# Patient Record
Sex: Male | Born: 1975 | Race: Black or African American | Hispanic: No | Smoking: Former smoker
Health system: Southern US, Community
[De-identification: ages and names within clinical notes are randomized; demographics above are authoritative.]

## PROBLEM LIST (undated history)

## (undated) DIAGNOSIS — E1169 Type 2 diabetes mellitus with other specified complication: Secondary | ICD-10-CM

## (undated) DIAGNOSIS — E669 Obesity, unspecified: Secondary | ICD-10-CM

## (undated) DIAGNOSIS — I2602 Saddle embolus of pulmonary artery with acute cor pulmonale: Secondary | ICD-10-CM

## (undated) DIAGNOSIS — I82431 Acute embolism and thrombosis of right popliteal vein: Secondary | ICD-10-CM

## (undated) DIAGNOSIS — R030 Elevated blood-pressure reading, without diagnosis of hypertension: Secondary | ICD-10-CM

## (undated) DIAGNOSIS — T7840XA Allergy, unspecified, initial encounter: Secondary | ICD-10-CM

## (undated) DIAGNOSIS — G4730 Sleep apnea, unspecified: Secondary | ICD-10-CM

## (undated) HISTORY — DX: Sleep apnea, unspecified: G47.30

## (undated) HISTORY — DX: Allergy, unspecified, initial encounter: T78.40XA

---

## 2014-03-16 ENCOUNTER — Inpatient Hospital Stay (HOSPITAL_BASED_OUTPATIENT_CLINIC_OR_DEPARTMENT_OTHER)
Admission: EM | Admit: 2014-03-16 | Discharge: 2014-03-21 | DRG: 175 | Disposition: A | Discharge status: Home / Self Care | Payer: Managed Care, Other (non HMO) | Attending: Internal Medicine | Admitting: Internal Medicine

## 2014-03-16 ENCOUNTER — Encounter (HOSPITAL_BASED_OUTPATIENT_CLINIC_OR_DEPARTMENT_OTHER): Payer: Self-pay | Admitting: *Deleted

## 2014-03-16 ENCOUNTER — Emergency Department (HOSPITAL_BASED_OUTPATIENT_CLINIC_OR_DEPARTMENT_OTHER): Payer: Managed Care, Other (non HMO)

## 2014-03-16 DIAGNOSIS — R0602 Shortness of breath: Secondary | ICD-10-CM

## 2014-03-16 DIAGNOSIS — Z6841 Body Mass Index (BMI) 40.0 and over, adult: Secondary | ICD-10-CM | POA: Diagnosis not present

## 2014-03-16 DIAGNOSIS — Q211 Atrial septal defect: Secondary | ICD-10-CM | POA: Diagnosis not present

## 2014-03-16 DIAGNOSIS — I2699 Other pulmonary embolism without acute cor pulmonale: Secondary | ICD-10-CM | POA: Diagnosis present

## 2014-03-16 DIAGNOSIS — J9601 Acute respiratory failure with hypoxia: Secondary | ICD-10-CM | POA: Diagnosis present

## 2014-03-16 DIAGNOSIS — E1165 Type 2 diabetes mellitus with hyperglycemia: Secondary | ICD-10-CM | POA: Diagnosis present

## 2014-03-16 DIAGNOSIS — R030 Elevated blood-pressure reading, without diagnosis of hypertension: Secondary | ICD-10-CM

## 2014-03-16 DIAGNOSIS — I10 Essential (primary) hypertension: Secondary | ICD-10-CM | POA: Diagnosis present

## 2014-03-16 DIAGNOSIS — J9600 Acute respiratory failure, unspecified whether with hypoxia or hypercapnia: Secondary | ICD-10-CM | POA: Diagnosis present

## 2014-03-16 DIAGNOSIS — K219 Gastro-esophageal reflux disease without esophagitis: Secondary | ICD-10-CM | POA: Diagnosis present

## 2014-03-16 DIAGNOSIS — I82431 Acute embolism and thrombosis of right popliteal vein: Secondary | ICD-10-CM | POA: Diagnosis present

## 2014-03-16 DIAGNOSIS — I2609 Other pulmonary embolism with acute cor pulmonale: Secondary | ICD-10-CM | POA: Diagnosis present

## 2014-03-16 DIAGNOSIS — E669 Obesity, unspecified: Secondary | ICD-10-CM

## 2014-03-16 DIAGNOSIS — F17210 Nicotine dependence, cigarettes, uncomplicated: Secondary | ICD-10-CM | POA: Diagnosis present

## 2014-03-16 DIAGNOSIS — I2602 Saddle embolus of pulmonary artery with acute cor pulmonale: Secondary | ICD-10-CM | POA: Diagnosis not present

## 2014-03-16 DIAGNOSIS — E1169 Type 2 diabetes mellitus with other specified complication: Secondary | ICD-10-CM

## 2014-03-16 DIAGNOSIS — R05 Cough: Secondary | ICD-10-CM

## 2014-03-16 DIAGNOSIS — R059 Cough, unspecified: Secondary | ICD-10-CM | POA: Diagnosis present

## 2014-03-16 DIAGNOSIS — Q2112 Patent foramen ovale: Secondary | ICD-10-CM

## 2014-03-16 HISTORY — DX: Obesity, unspecified: E66.9

## 2014-03-16 HISTORY — DX: Saddle embolus of pulmonary artery with acute cor pulmonale: I26.02

## 2014-03-16 HISTORY — DX: Acute embolism and thrombosis of right popliteal vein: I82.431

## 2014-03-16 HISTORY — DX: Elevated blood-pressure reading, without diagnosis of hypertension: R03.0

## 2014-03-16 HISTORY — DX: Type 2 diabetes mellitus with other specified complication: E11.69

## 2014-03-16 LAB — CBC WITH DIFFERENTIAL/PLATELET
Basophils Absolute: 0 10*3/uL (ref 0.0–0.1)
Basophils Relative: 0 % (ref 0–1)
EOS PCT: 1 % (ref 0–5)
Eosinophils Absolute: 0.1 10*3/uL (ref 0.0–0.7)
HEMATOCRIT: 43.1 % (ref 39.0–52.0)
HEMOGLOBIN: 15.3 g/dL (ref 13.0–17.0)
LYMPHS ABS: 2 10*3/uL (ref 0.7–4.0)
Lymphocytes Relative: 24 % (ref 12–46)
MCH: 26.7 pg (ref 26.0–34.0)
MCHC: 35.5 g/dL (ref 30.0–36.0)
MCV: 75.3 fL — AB (ref 78.0–100.0)
MONO ABS: 1 10*3/uL (ref 0.1–1.0)
Monocytes Relative: 11 % (ref 3–12)
NEUTROS ABS: 5.2 10*3/uL (ref 1.7–7.7)
NEUTROS PCT: 64 % (ref 43–77)
PLATELETS: 243 10*3/uL (ref 150–400)
RBC: 5.72 MIL/uL (ref 4.22–5.81)
RDW: 13.9 % (ref 11.5–15.5)
WBC: 8.3 10*3/uL (ref 4.0–10.5)

## 2014-03-16 LAB — I-STAT ARTERIAL BLOOD GAS, ED
Acid-base deficit: 4 mmol/L — ABNORMAL HIGH (ref 0.0–2.0)
Bicarbonate: 20 mEq/L (ref 20.0–24.0)
O2 Saturation: 86 %
PCO2 ART: 31.8 mmHg — AB (ref 35.0–45.0)
TCO2: 21 mmol/L (ref 0–100)
pH, Arterial: 7.407 (ref 7.350–7.450)
pO2, Arterial: 50 mmHg — ABNORMAL LOW (ref 80.0–100.0)

## 2014-03-16 LAB — COMPREHENSIVE METABOLIC PANEL
ALBUMIN: 4.2 g/dL (ref 3.5–5.2)
ALT: 28 U/L (ref 0–53)
AST: 27 U/L (ref 0–37)
Alkaline Phosphatase: 101 U/L (ref 39–117)
Anion gap: 9 (ref 5–15)
BUN: 10 mg/dL (ref 6–23)
CALCIUM: 9.1 mg/dL (ref 8.4–10.5)
CHLORIDE: 99 mmol/L (ref 96–112)
CO2: 24 mmol/L (ref 19–32)
Creatinine, Ser: 0.95 mg/dL (ref 0.50–1.35)
GFR calc Af Amer: >90 mL/min (ref >90)
Glucose, Bld: 344 mg/dL — ABNORMAL HIGH (ref 70–99)
Potassium: 3.6 mmol/L (ref 3.5–5.1)
Sodium: 132 mmol/L — ABNORMAL LOW (ref 135–145)
TOTAL PROTEIN: 7.9 g/dL (ref 6.0–8.3)
Total Bilirubin: 0.5 mg/dL (ref 0.3–1.2)

## 2014-03-16 LAB — BRAIN NATRIURETIC PEPTIDE: B Natriuretic Peptide: 31.3 pg/mL (ref 0.0–100.0)

## 2014-03-16 LAB — TROPONIN I: TROPONIN I: 0.03 ng/mL (ref <0.031)

## 2014-03-16 LAB — GLUCOSE, CAPILLARY: GLUCOSE-CAPILLARY: 347 mg/dL — AB (ref 70–99)

## 2014-03-16 MED ORDER — HEPARIN (PORCINE) IN NACL 100-0.45 UNIT/ML-% IJ SOLN: 18.0000 [IU]/kg/h | Freq: Once | INTRAMUSCULAR | Status: DC

## 2014-03-16 MED ORDER — HEPARIN SODIUM (PORCINE) 5000 UNIT/ML IJ SOLN
60.0000 [IU]/kg | Freq: Once | INTRAMUSCULAR | Status: DC
  Filled 2014-03-16: qty 2

## 2014-03-16 MED ORDER — HEPARIN BOLUS VIA INFUSION
4000.0000 [IU] | Freq: Once | INTRAVENOUS | Status: AC
  Administered 2014-03-16: 4000 [IU] via INTRAVENOUS
  Administered 2014-03-16: 22:00:00 via INTRAVENOUS

## 2014-03-16 MED ORDER — IOHEXOL 350 MG/ML SOLN
100.0000 mL | Freq: Once | INTRAVENOUS | Status: AC | PRN
  Administered 2014-03-16: 100 mL via INTRAVENOUS

## 2014-03-16 MED ORDER — HEPARIN (PORCINE) IN NACL 100-0.45 UNIT/ML-% IJ SOLN
2100.0000 [IU]/h | INTRAMUSCULAR | Status: DC
  Administered 2014-03-16: 1850 [IU]/h via INTRAVENOUS
  Administered 2014-03-17: 2100 [IU]/h via INTRAVENOUS
  Filled 2014-03-16 (×4): qty 250

## 2014-03-16 MED ORDER — HEPARIN SODIUM (PORCINE) 5000 UNIT/ML IJ SOLN: 80.0000 [IU]/kg | Freq: Once | INTRAMUSCULAR | Status: DC

## 2014-03-16 MED ORDER — INSULIN ASPART 100 UNIT/ML ~~LOC~~ SOLN
0.0000 [IU] | Freq: Three times a day (TID) | SUBCUTANEOUS | Status: DC
  Administered 2014-03-17: 11 [IU] via SUBCUTANEOUS
  Administered 2014-03-17: 7 [IU] via SUBCUTANEOUS
  Administered 2014-03-17: 4 [IU] via SUBCUTANEOUS

## 2014-03-16 MED ORDER — HEPARIN (PORCINE) IN NACL 100-0.45 UNIT/ML-% IJ SOLN
INTRAMUSCULAR | Status: AC
  Filled 2014-03-16: qty 250

## 2014-03-16 MED ORDER — PANTOPRAZOLE SODIUM 40 MG IV SOLR
40.0000 mg | INTRAVENOUS | Status: DC
  Administered 2014-03-17 – 2014-03-19 (×3): 40 mg via INTRAVENOUS
  Filled 2014-03-16 (×4): qty 40

## 2014-03-16 MED ORDER — HEPARIN SODIUM (PORCINE) 5000 UNIT/ML IJ SOLN
INTRAMUSCULAR | Status: AC
  Filled 2014-03-16: qty 1

## 2014-03-16 NOTE — ED Notes (Signed)
Increased dyspnea with any exceration denies chest pain

## 2014-03-16 NOTE — ED Notes (Signed)
Cough. Bronchitis for the past week. Today he walked to his car and was unable to breath.

## 2014-03-16 NOTE — Progress Notes (Signed)
ANTICOAGULATION CONSULT NOTE - Initial Consult  Pharmacy Consult for Heparin  Indication: Pulmonary embolism   No Known Allergies  Patient Measurements: Height: 6' (182.9 cm) Weight: (!) 320 lb (145.151 kg) IBW/kg (Calculated) : 77.6 Heparin Dosing Weight: 111 kg   Vital Signs: Temp: 99.7 F (37.6 C) (02/01 1915) Temp Source: Oral (02/01 1915) BP: 170/89 mmHg (02/01 1915) Pulse Rate: 123 (02/01 1915)  Labs:  Recent Labs  03/16/14 1935  HGB 15.3  HCT 43.1  PLT 243  CREATININE 0.95  TROPONINI 0.03    Estimated Creatinine Clearance: 157.5 mL/min (by C-G formula based on Cr of 0.95).   Medical History: Past Medical History  Diagnosis Date  . Borderline diabetic   . Borderline hypertension     Medications:   (Not in a hospital admission)  Assessment: [age] YOM with no chronic medical conditions presented to the ED with SOB that started today. CT chest shows saddle embolus would large emboli extending into both lower lobes. Emboli also noted in both upper lobes. Possible early lingular pulmonary infarct.  CT also shows elevated RV to LV ratio suggest right heart strain and (at least) sub massive pulmonary embolus.  H/H and Plt wnl. No anticoagulants prior to admission  Goal of Therapy:  Heparin level 0.3-0.7 units/ml Monitor platelets by anticoagulation protocol: Yes   Plan:  -Give heparin 4000 units bolus followed by heparin infusion at 1850 units/hr.  -F/u 6 hr HL  -Monitor daily HL, CBC, and s/s of bleeding -F/u plan [facility]   Sohum Delillo, PharmD., BCPS Clinical Pharmacist Pager [phone]

## 2014-03-16 NOTE — Progress Notes (Signed)
Patient has been placed on 100% non re breather.  Patients SPO2 has increased to 100%,

## 2014-03-16 NOTE — ED Provider Notes (Signed)
CSN: [id number]     Arrival date & time 03/16/14  1900 History  This chart was scribed for Arby Barrette, MD by Gwenyth Ober, ED Scribe. This patient was seen in room MHT14/MHT14 and the patient's care was started at 8:38 PM.    Chief Complaint  Patient presents with  . Shortness of Breath   The history is provided by the patient. No language interpreter was used.    HPI Comments: Jordan Cline is a 39 y.o. male with no chronic medical conditions who presents to the Emergency Department complaining of constant SOB that started today. He states he was walking to his car when he felt SOB, light-headedness and near-syncope. Pt states he had bronchitis-like symptoms for the last 1-2 weeks that were gradually improving prior to today. He describes symptoms as productive cough with clear mucous, 1 episode of hemoptysis and intermittent sore throat. He also reports intermittent left-sided chest pain and back pain that is occasionally sharp and occurs with deep breaths. He has tried using his inhaler with no relief. Pt has a history of pneumonia and notes infection occurs almost annually. He quit smoking 2 weeks ago. Pt denies a family history of PE/DVT and early cardiac problems. He denies recent prolonged recent travel. He also denies fever as an associated symptom.  Past Medical History  Diagnosis Date  . Borderline diabetic   . Borderline hypertension    History reviewed. No pertinent past surgical history. History reviewed. No pertinent family history. History  Substance Use Topics  . Smoking status: Current Every Day Smoker    Types: Cigarettes  . Smokeless tobacco: Not on file  . Alcohol Use: Yes     Comment: occasionallly    Review of Systems  Constitutional: Negative for fever.  HENT: Positive for sore throat.   Respiratory: Positive for cough and shortness of breath.   Cardiovascular: Positive for chest pain.  Musculoskeletal: Positive for back pain.  Neurological: Positive for  light-headedness.   10 Systems reviewed and are negative for acute change except as noted in the HPI.    Allergies  Review of patient's allergies indicates no known allergies.  Home Medications   Prior to Admission medications   Not on File   BP 170/89 mmHg  Pulse 123  Temp(Src) 99.7 F (37.6 C) (Oral)  Resp 20  Ht 6' (1.829 m)  Wt 320 lb (145.151 kg)  BMI 43.39 kg/m2  SpO2 88% Physical Exam  Constitutional: He is oriented to person, place, and time. He appears well-developed and well-nourished.  The patient is alert and nontoxic. He is tachypneic with clear mental status, speaking in full short sentences.  HENT:  Head: Normocephalic and atraumatic.  Eyes: EOM are normal. Pupils are equal, round, and reactive to light.  Neck: Neck supple.  Cardiovascular: Normal rate, regular rhythm, normal heart sounds and intact distal pulses.   Pulmonary/Chest: Breath sounds normal. He is in respiratory distress (patient has tachypnea but is maintaining his airway appropriately without any evidence of fatigue.). He has no wheezes. He has no rales. He exhibits no tenderness.  Abdominal: Soft. Bowel sounds are normal. He exhibits no distension. There is no tenderness.  Musculoskeletal: Normal range of motion. He exhibits no edema or tenderness.  Neurological: He is alert and oriented to person, place, and time. He has normal strength. Coordination normal. GCS eye subscore is 4. GCS verbal subscore is 5. GCS motor subscore is 6.  Skin: Skin is warm, dry and intact.  Psychiatric: He has  a normal mood and affect.    ED Course  Procedures (including critical care time) DIAGNOSTIC STUDIES: Oxygen Saturation is 88% on RA, low by my interpretation.    COORDINATION OF CARE: 8:50 PM Discussed treatment plan with pt at bedside and pt agreed to plan.  Labs Review Labs Reviewed  CBC WITH DIFFERENTIAL/PLATELET - Abnormal; Notable for the following:    MCV 75.3 (*)    All other components within  normal limits  COMPREHENSIVE METABOLIC PANEL - Abnormal; Notable for the following:    Sodium 132 (*)    Glucose, Bld 344 (*)    All other components within normal limits  I-STAT ARTERIAL BLOOD GAS, ED - Abnormal; Notable for the following:    pCO2 arterial 31.8 (*)    pO2, Arterial 50.0 (*)    Acid-base deficit 4.0 (*)    All other components within normal limits  TROPONIN I  BRAIN NATRIURETIC PEPTIDE    Imaging Review Ct Angio Chest Pe W/cm &/or Wo Cm  03/16/2014   CLINICAL DATA:  Shortness of breath, difficulty breathing.  EXAM: CT ANGIOGRAPHY CHEST WITH CONTRAST  TECHNIQUE: Multidetector CT imaging of the chest was performed using the standard protocol during bolus administration of intravenous contrast. Multiplanar CT image reconstructions and MIPs were obtained to evaluate the vascular anatomy.  CONTRAST:  OMNIPAQUE IOHEXOL 350 MG/ML SOLN  COMPARISON:  None.  FINDINGS: There is a saddle embolus noted with extension of the emboli predominantly into the lower lobes bilaterally, but also noted in both upper lobes. The right ventricle to left ventricle ratio is elevated at 1.59 suggesting right ventricular strain. Heart is normal size. Aorta is normal caliber. No mediastinal, hilar, or axillary adenopathy. Chest wall soft tissues are unremarkable.  Airspace opacification noted in the lingula. There appears to be embolus within the subtending pulmonary artery suggesting this could represent pulmonary infarct. Trace left pleural effusion noted.  Imaging into the upper abdomen shows no acute findings. There appears to be diffuse fatty infiltration of the liver.  Review of the MIP images confirms the above findings.  IMPRESSION: Saddle embolus would large emboli extending into both lower lobes. Emboli also noted in both upper lobes. Possible early lingular pulmonary infarct.  Elevated RV to LV ratio suggest right heart strain and (at least) sub massive pulmonary embolus.  Critical Value/emergent  results were called by telephone at the time of interpretation on 03/16/2014 at 9:53 pm to Dr. Arby Barrette , who verbally acknowledged these results.   Electronically Signed   By: Charlett Nose M.D.   On: 03/16/2014 21:55   Dg Chest Port 1 View  03/16/2014   CLINICAL DATA:  Acute onset of shortness of breath and difficulty breathing. Initial encounter.  EXAM: PORTABLE CHEST - 1 VIEW  COMPARISON:  None.  FINDINGS: The lungs are well-aerated. Vascular congestion is noted. Mildly increased interstitial markings could reflect mild interstitial edema. There is no evidence of pleural effusion or pneumothorax.  The cardiomediastinal silhouette is within normal limits. No acute osseous abnormalities are seen.  IMPRESSION: Vascular congestion noted. Mildly increased interstitial markings could reflect mild interstitial edema, given the patient's symptoms.   Electronically Signed   By: Roanna Raider M.D.   On: 03/16/2014 20:27     EKG Interpretation   Date/Time:  Monday March 16 2014 19:29:53 EST Ventricular Rate:  126 PR Interval:  126 QRS Duration: 88 QT Interval:  316 QTC Calculation: 457 R Axis:   76 Text Interpretation:  Sinus tachycardia Marked  ST abnormality, possible  inferior subendocardial injury Abnormal ECG AGREE. NO STEMI. Confirmed by  Donnald GarrePfeiffer, MD, Lebron ConnersMarcy [phone]) on 03/16/2014 7:32:20 PM     Consult 21:05: The patient's case was reviewed with Dr. Alvester MorinNewton of [facility]. He is accepted [facility] with step down admission. At this time CT PE study is pending.   Recheck 22:00 The patient is alert with clear mental status. Respiratory status unchanged, no sign of fatigue patient is 100% on a nonrebreather. Heart rate is unchanged at the 120s and sinus. The patient is informed of his diagnosis.  CRITICAL CARE Performed by: Arby BarrettePfeiffer, Elbony Mcclimans   Total critical care time: 60  Critical care time was exclusive of separately billable procedures and treating other patients.  Critical  care was necessary to treat or prevent imminent or life-threatening deterioration.  Critical care was time spent personally by me on the following activities: development of treatment plan with patient and/or surrogate as well as nursing, discussions with consultants, evaluation of patient's response to treatment, examination of patient, obtaining history from patient or surrogate, ordering and performing treatments and interventions, ordering and review of laboratory studies, ordering and review of radiographic studies, pulse oximetry and re-evaluation of patient's condition. 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 Today he became acutely very short of breath at 6 PM after work. CT PE study has identified a saddle embolus. Patient does not appear at this time to have risk factors leading up to this. Admission to [facility] had been arranged prior to CT results based on the patient's finding of hypoxia and tachycardia with suspicion for significant either cardiac or pulmonary etiology. At this time consult has also been placed for vascular surgery however the patient's transport is en route [facility].    Arby BarretteMarcy Kalilah Barua, MD 03/21/14 1324

## 2014-03-16 NOTE — Progress Notes (Addendum)
ANTICOAGULATION CONSULT NOTE - Initial Consult  Pharmacy Consult for Heparin  Indication: Pulmonary embolism   No Known Allergies  Patient Measurements: Height: 6' (182.9 cm) Weight: (!) 320 lb (145.151 kg) IBW/kg (Calculated) : 77.6 Heparin Dosing Weight: 111 kg   Vital Signs: Temp: 98.7 F (37.1 C) (02/01 2240) Temp Source: Oral (02/01 2240) BP: 134/99 mmHg (02/01 2240) Pulse Rate: 134 (02/01 2240)  Labs:  Recent Labs  03/16/14 1935  HGB 15.3  HCT 43.1  PLT 243  CREATININE 0.95  TROPONINI 0.03    Estimated Creatinine Clearance: 157.5 mL/min (by C-G formula based on Cr of 0.95).   Medical History: Past Medical History  Diagnosis Date  . Borderline diabetic   . Borderline hypertension     Medications:  No prescriptions prior to admission    Assessment: [age] YOM with no chronic medical conditions presented to the ED with SOB that started today. CT chest shows saddle embolus would large emboli extending into both lower lobes. Emboli also noted in both upper lobes. Possible early lingular pulmonary infarct.  CT also shows elevated RV to LV ratio suggest right heart strain and (at least) sub massive pulmonary embolus.  H/H and Plt wnl. No anticoagulants prior to admission  Goal of Therapy:  Heparin level 0.3-0.7 units/ml Monitor platelets by anticoagulation protocol: Yes   Plan:  -Give heparin 4000 units bolus followed by heparin infusion at 1850 units/hr.  -F/u 6 hr HL  -Monitor daily HL, CBC, and s/s of bleeding -F/u plan after [facility]   Benjamin Mancheril, PharmD., BCPS Clinical Pharmacist Pager [phone]   11:55 PM       Per Dr Marchelle Gearingamaswamy, due to clinical condition and concern for low dose of heparin considering age, body mass, renal dysfunction and possible need for lytics he wants a more aggressive heparin dose.     Repeat heparin 4000 unit bolus. ( 4000 given at 2200) total 2 hr bolus of 55 units/kg of active body weight.  And  increase rate to 14 units/kg actual body weight. Or 2100 units/hr  Lytic of choice for emergent condition is TNKase. (more rapid infusion time 5 min vs 2 hours) or EKOS catheter directed lysis.  Move am HL to 0600  Note: confirmed with misty, [facility] that despite documentation on MAR from 2210 and 2212  that patient received 8000 unit bolus (4000x2) he indeed received only 4000 x1 and she just made a documentation error. She is in the process of editing the [facility] to correct to the proper dose received which was only 4000 units. Thus the total bolus received over 2 hours is 8000 units or (55 units/kg ABW)   Janice CoffinEarl, Oryn Casanova Jonathan

## 2014-03-16 NOTE — ED Notes (Signed)
Pt becomes very SOB with any exceration o2 SAT FALLS TO MID 80's with any movement EDP  notified

## 2014-03-16 NOTE — Evaluation (Signed)
[facility] transfer  EDP: Pfeiffer  Reason: hypoxia, tachycardia   3 weeks cough, sob Obese Dyspneic, hypoxic, tachycardic on [facility]  BNP pending  BP stable  Na 132 Glu 344 Vascular congestion on CXR Pending CTAngio pending  Accepted to SDU

## 2014-03-16 NOTE — ED Notes (Signed)
Placed on NRB maask after returning from CT scan d/t inability to compensate for exceration of moving from CT scanner to stretcher.

## 2014-03-16 NOTE — H&P (Signed)
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 Last few weeks has been having dyspnea, cough and passed it off as bronchitis. Some worse this past week along with gerd and atypical chest pain. On 03/16/14 upon entering car after leaving office building became very dyspneic, light headed and felt like passing out. Went to [facility] - and submassive PE with RV strain on CT angio confirmed (duplex LE data not available). Initial troponin normal. Started on IV heparin and transffered to [facility]. Prior to transfer decompensated from [place] o2 to 100% face mask (? 15L via NRB). Dyspneic and anxious upon [facility] in [facility] at [facility]. PCCM admitting 03/16/2014 . Denies prololnged travel    PAST MEDICAL HISTORY :   has a past medical history of Borderline diabetic and Borderline hypertension.  has no past surgical history on file. Prior to Admission medications   Not on File   No Known Allergies  FAMILY HISTORY:  has no family status information on file.  SOCIAL HISTORY:  reports that he has been smoking Cigarettes.  He does not have any smokeless tobacco history on file. He reports that he drinks alcohol.  REVIEW OF SYSTEMS:  Chest pain, dyspnea. . Rest per HPI. Otherwise 11 point ROS negative  SUBJECTIVE:   VITAL SIGNS: Temp:  [98.7 F (37.1 C)-99.7 F (37.6 C)] 98.7 F (37.1 C) (02/01 2240) Pulse Rate:  [123-134] 134 (02/01 2240) Resp:  [20-26] 26 (02/01 2240) BP: (134-170)/(89-99) 134/99 mmHg (02/01 2240) SpO2:  [88 %-90 %] 90 % (02/01 2249) Weight:  [320 lb (145.151 kg)] 320 lb  (145.151 kg) (02/01 1915) HEMODYNAMICS:   VENTILATOR SETTINGS:   INTAKE / OUTPUT: No intake or output data in the 24 hours ending 03/16/14 2329  PHYSICAL EXAMINATION: General:  Obese male. Looks unwell Neuro:  Alert and oriented x 3. Speech normal. Moves all 4s. Not confused Psych: anxious HEENT:  Neck supple. No elevated JVP. No neck nodes Cardiovascular:  HR 137 Lungs:  CTA bilateraly. RR 27. Speaks full sentences. But gets anxious. No acc muscle use. On face mask o2 Abdomen:  Obese. Soft. No mass Musculoskeletal:  No cyanosis. No clubbing. No edema. SOME WARMTH in LEFT CALF + Skin:  Intact but dry  LABS:  CBC  Recent Labs Lab 03/16/14 1935  WBC 8.3  HGB 15.3  HCT 43.1  PLT 243   Coag's No results for input(s): APTT, INR in the last 168 hours. BMET  Recent Labs Lab 03/16/14 1935  NA 132*  K 3.6  CL 99  CO2 24  BUN 10  CREATININE 0.95  GLUCOSE 344*   Electrolytes  Recent Labs Lab 03/16/14 1935  CALCIUM 9.1   Sepsis Markers No results for input(s): LATICACIDVEN, PROCALCITON, O2SATVEN in the last 168 hours. ABG  Recent Labs Lab 03/16/14 2153  PHART 7.407  PCO2ART 31.8*  PO2ART 50.0*   Liver Enzymes  Recent Labs Lab 03/16/14 1935  AST 27  ALT 28  ALKPHOS 101  BILITOT 0.5  ALBUMIN 4.2   Cardiac Enzymes  Recent Labs Lab  03/16/14 1935  TROPONINI 0.03   Glucose No results for input(s): GLUCAP in the last 168 hours.  Imaging No results found. CT angio 2/116 - submassive central PE with RV strain  ASSESSMENT / PLAN:  PULMONARY  A:  - Submassive PE - central saddel PE with distribution to bilateral UL and LL -  RV strain on ECHO but initial troponin and bnp normal  - Severity: Class 4 of 5 PESI score  - HIGH SCORE  - Acute hypoxemic respiratory failure  Due to PE needing face mask  - Risk factor: obese, smoker, sedentary - sits > 8h/day, grandmother with PE (possibly)  P:   IV heparin therapy (currenltly might be under-dosed)  -  Lytic Rx if decompensates   - if low bp or dVT + , then systemic lytic.   - IF worsening hypxoemia and normal bp/without DVT - then IR catheter directed lytic Rx  Rule out DVT Check ECHO    Contraindications for lytic reviewed below - none exist - see below  Prior intracranial hemorrhage Known structural cerebral vascular lesion Known malignant intracranial neoplasm Ischemic stroke within three months (excluding stroke within three hours*) Suspected aortic dissection Active bleeding or bleeding diathesis (excluding menses) Significant closed-head trauma or facial trauma within three months   Relative contraindications History of chronic, severe, poorly controlled hypertension Severe uncontrolled hypertension on presentation (SBP >180 mmHg or DBP >110 mmHg) History of ischemic stroke more than three months prior Traumatic or prolonged (>10 minute) CPR or major surgery less than three weeks Recent (within two to four weeks) internal bleeding Noncompressible vascular punctures Recent invasive procedure For streptokinase/anistreplase - Prior exposure (more than five days ago) or prior allergic reaction to these agents Pregnancy Active peptic ulcer Pericarditis or pericardial fluid Current use of anticoagulant (eg, warfarin sodium) that has produced an elevated international normalized ratio (INR) >1.7 or prothrombin time (PT) >15 seconds Age >75 years Diabetic retinopathy   CARDIOVASCULAR  A: at riskf or hemodynamic collapse P:  mnitor closely  RENAL A:  Nil acute P:   Check lytes  GASTROINTESTINAL A:  Nil acute has hx of gerd  P:   Npo ppi   HEMATOLOGIC A:  At risk for bleeding with anticoagulation P:  monitor  INFECTIOUS A:  No evicende of infection P:   monitor    ENDOCRINE A:  DM    P:   ssi  NEUROLOGIC A:  intact P:   monitior  FAMILY  - Updates: PAtient and fiancee updated at bedsicde 03/16/2014  - Inter-disciplinary family meet or  Palliative Care meeting due by:03/23/14  [facility] Submaissve PE - Rx IV heparin. If worsens, lyse. But might consider preemptive lysis if not better. wIll need to explani risks to fiancee     The patient is critically ill with multiple organ systems failure and requires high complexity decision making for assessment and support, frequent evaluation and titration of therapies, application of advanced monitoring technologies and extensive interpretation of multiple databases.   Critical Care Time devoted to patient care services described in this note is  45  Minutes. This time reflects time of care of this signee Dr Kalman Shan. This critical care time does not reflect procedure time, or teaching time or supervisory time of PA/NP/Med student/Med Resident etc but could involve care discussion time    Dr. Kalman Shan, M.D., [facility]C.P Pulmonary and Critical Care Medicine Staff Physician [facility] [place] Pulmonary and Critical Care Pager: [phone], If no answer or  between  15:00h - 7:00h: call [phone]  03/16/2014 11:58 PM

## 2014-03-16 NOTE — ED Notes (Signed)
Report to RN with [facility]

## 2014-03-17 ENCOUNTER — Inpatient Hospital Stay (HOSPITAL_COMMUNITY): Payer: Managed Care, Other (non HMO)

## 2014-03-17 DIAGNOSIS — J9601 Acute respiratory failure with hypoxia: Secondary | ICD-10-CM

## 2014-03-17 DIAGNOSIS — I2602 Saddle embolus of pulmonary artery with acute cor pulmonale: Secondary | ICD-10-CM

## 2014-03-17 DIAGNOSIS — I2699 Other pulmonary embolism without acute cor pulmonale: Secondary | ICD-10-CM

## 2014-03-17 LAB — CBC
HCT: 38.6 % — ABNORMAL LOW (ref 39.0–52.0)
HCT: 35.8 % — ABNORMAL LOW (ref 39.0–52.0)
HEMATOCRIT: 38.1 % — AB (ref 39.0–52.0)
HEMATOCRIT: 39.1 % (ref 39.0–52.0)
HEMATOCRIT: 42.9 % (ref 39.0–52.0)
HEMOGLOBIN: 13.4 g/dL (ref 13.0–17.0)
Hemoglobin: 12.3 g/dL — ABNORMAL LOW (ref 13.0–17.0)
Hemoglobin: 13.3 g/dL (ref 13.0–17.0)
Hemoglobin: 13.5 g/dL (ref 13.0–17.0)
Hemoglobin: 15.3 g/dL (ref 13.0–17.0)
MCH: 26.5 pg (ref 26.0–34.0)
MCH: 26.5 pg (ref 26.0–34.0)
MCH: 26.7 pg (ref 26.0–34.0)
MCH: 26.7 pg (ref 26.0–34.0)
MCH: 27.6 pg (ref 26.0–34.0)
MCHC: 34.3 g/dL (ref 30.0–36.0)
MCHC: 34.9 g/dL (ref 30.0–36.0)
MCHC: 35 g/dL (ref 30.0–36.0)
MCHC: 34.4 g/dL (ref 30.0–36.0)
MCHC: 35.7 g/dL (ref 30.0–36.0)
MCV: 76.4 fL — AB (ref 78.0–100.0)
MCV: 77 fL — AB (ref 78.0–100.0)
MCV: 78 fL (ref 78.0–100.0)
MCV: 75.9 fL — ABNORMAL LOW (ref 78.0–100.0)
MCV: 77.4 fL — AB (ref 78.0–100.0)
PLATELETS: 180 10*3/uL (ref 150–400)
PLATELETS: 204 10*3/uL (ref 150–400)
PLATELETS: 232 10*3/uL (ref 150–400)
Platelets: 229 10*3/uL (ref 150–400)
Platelets: 168 10*3/uL (ref 150–400)
RBC: 5.01 MIL/uL (ref 4.22–5.81)
RBC: 5.02 MIL/uL (ref 4.22–5.81)
RBC: 5.05 MIL/uL (ref 4.22–5.81)
RBC: 4.65 MIL/uL (ref 4.22–5.81)
RBC: 5.54 MIL/uL (ref 4.22–5.81)
RDW: 13.9 % (ref 11.5–15.5)
RDW: 13.9 % (ref 11.5–15.5)
RDW: 14 % (ref 11.5–15.5)
RDW: 14 % (ref 11.5–15.5)
RDW: 14 % (ref 11.5–15.5)
WBC: 7.5 10*3/uL (ref 4.0–10.5)
WBC: 8.7 10*3/uL (ref 4.0–10.5)
WBC: 9.3 10*3/uL (ref 4.0–10.5)
WBC: 9.9 10*3/uL (ref 4.0–10.5)
WBC: 9.7 10*3/uL (ref 4.0–10.5)

## 2014-03-17 LAB — BASIC METABOLIC PANEL
Anion gap: 13 (ref 5–15)
Anion gap: 8 (ref 5–15)
BUN: 6 mg/dL (ref 6–23)
BUN: 8 mg/dL (ref 6–23)
CO2: 20 mmol/L (ref 19–32)
CO2: 21 mmol/L (ref 19–32)
Calcium: 9.3 mg/dL (ref 8.4–10.5)
Calcium: 7.8 mg/dL — ABNORMAL LOW (ref 8.4–10.5)
Chloride: 100 mmol/L (ref 96–112)
Chloride: 106 mmol/L (ref 96–112)
Creatinine, Ser: 1.05 mg/dL (ref 0.50–1.35)
Creatinine, Ser: 0.91 mg/dL (ref 0.50–1.35)
GFR calc Af Amer: >90 mL/min (ref >90)
GFR calc Af Amer: >90 mL/min (ref >90)
GFR calc non Af Amer: 89 mL/min — ABNORMAL LOW (ref >90)
GFR calc non Af Amer: >90 mL/min (ref >90)
Glucose, Bld: 383 mg/dL — ABNORMAL HIGH (ref 70–99)
Glucose, Bld: 308 mg/dL — ABNORMAL HIGH (ref 70–99)
POTASSIUM: 3.9 mmol/L (ref 3.5–5.1)
Potassium: 4.5 mmol/L (ref 3.5–5.1)
Sodium: 133 mmol/L — ABNORMAL LOW (ref 135–145)
Sodium: 135 mmol/L (ref 135–145)

## 2014-03-17 LAB — MRSA PCR SCREENING: MRSA BY PCR: NEGATIVE

## 2014-03-17 LAB — HEPARIN LEVEL (UNFRACTIONATED)
HEPARIN UNFRACTIONATED: 0.77 [IU]/mL — AB (ref 0.30–0.70)
Heparin Unfractionated: 0.72 IU/mL — ABNORMAL HIGH (ref 0.30–0.70)
Heparin Unfractionated: 0.27 IU/mL — ABNORMAL LOW (ref 0.30–0.70)
Heparin Unfractionated: 0.21 IU/mL — ABNORMAL LOW (ref 0.30–0.70)

## 2014-03-17 LAB — FIBRINOGEN
FIBRINOGEN: 542 mg/dL — AB (ref 204–475)
FIBRINOGEN: 471 mg/dL (ref 204–475)
Fibrinogen: 539 mg/dL — ABNORMAL HIGH (ref 204–475)

## 2014-03-17 LAB — LACTIC ACID, PLASMA
Lactic Acid, Venous: 2.4 mmol/L (ref 0.5–2.0)
Lactic Acid, Venous: 1.6 mmol/L (ref 0.5–2.0)

## 2014-03-17 LAB — PHOSPHORUS
PHOSPHORUS: 3.5 mg/dL (ref 2.3–4.6)
Phosphorus: 3.9 mg/dL (ref 2.3–4.6)

## 2014-03-17 LAB — MAGNESIUM
MAGNESIUM: 1.6 mg/dL (ref 1.5–2.5)
Magnesium: 1.5 mg/dL (ref 1.5–2.5)

## 2014-03-17 LAB — GLUCOSE, CAPILLARY
GLUCOSE-CAPILLARY: 182 mg/dL — AB (ref 70–99)
GLUCOSE-CAPILLARY: 277 mg/dL — AB (ref 70–99)
Glucose-Capillary: 264 mg/dL — ABNORMAL HIGH (ref 70–99)
Glucose-Capillary: 236 mg/dL — ABNORMAL HIGH (ref 70–99)

## 2014-03-17 LAB — TROPONIN I
Troponin I: 0.22 ng/mL — ABNORMAL HIGH (ref <0.031)
Troponin I: 0.35 ng/mL — ABNORMAL HIGH (ref <0.031)
Troponin I: 0.27 ng/mL — ABNORMAL HIGH (ref <0.031)

## 2014-03-17 LAB — PROTIME-INR
INR: 1.15 (ref 0.00–1.49)
Prothrombin Time: 14.8 seconds (ref 11.6–15.2)

## 2014-03-17 LAB — BRAIN NATRIURETIC PEPTIDE: B NATRIURETIC PEPTIDE 5: 14.8 pg/mL (ref 0.0–100.0)

## 2014-03-17 MED ORDER — SODIUM CHLORIDE 0.9 % IJ SOLN
3.0000 mL | INTRAMUSCULAR | Status: DC | PRN
  Administered 2014-03-17: 3 mL via INTRAVENOUS
  Filled 2014-03-17: qty 3

## 2014-03-17 MED ORDER — SODIUM CHLORIDE 0.9 % IV SOLN: INTRAVENOUS | Status: DC

## 2014-03-17 MED ORDER — INFLUENZA VAC SPLIT QUAD 0.5 ML IM SUSY
0.5000 mL | PREFILLED_SYRINGE | INTRAMUSCULAR | Status: DC
  Filled 2014-03-17: qty 0.5

## 2014-03-17 MED ORDER — HEPARIN (PORCINE) IN NACL 100-0.45 UNIT/ML-% IJ SOLN
2050.0000 [IU]/h | INTRAMUSCULAR | Status: DC
  Administered 2014-03-17: 2050 [IU]/h via INTRAVENOUS

## 2014-03-17 MED ORDER — MIDAZOLAM HCL 2 MG/2ML IJ SOLN
INTRAMUSCULAR | Status: AC
  Filled 2014-03-17: qty 6

## 2014-03-17 MED ORDER — SODIUM CHLORIDE 0.9 % IV SOLN: 250.0000 mL | INTRAVENOUS | Status: DC | PRN

## 2014-03-17 MED ORDER — SODIUM CHLORIDE 0.9 % IJ SOLN
3.0000 mL | Freq: Two times a day (BID) | INTRAMUSCULAR | Status: DC
  Administered 2014-03-17 – 2014-03-20 (×7): 3 mL via INTRAVENOUS

## 2014-03-17 MED ORDER — FENTANYL CITRATE 0.05 MG/ML IJ SOLN
INTRAMUSCULAR | Status: AC | PRN
  Administered 2014-03-17 (×3): 50 ug via INTRAVENOUS

## 2014-03-17 MED ORDER — HEPARIN (PORCINE) IN NACL 100-0.45 UNIT/ML-% IJ SOLN
2250.0000 [IU]/h | INTRAMUSCULAR | Status: DC
  Administered 2014-03-17: 2150 [IU]/h via INTRAVENOUS
  Administered 2014-03-18: 2250 [IU]/h via INTRAVENOUS
  Filled 2014-03-17 (×3): qty 250

## 2014-03-17 MED ORDER — LIDOCAINE HCL 1 % IJ SOLN
INTRAMUSCULAR | Status: AC
  Filled 2014-03-17: qty 20

## 2014-03-17 MED ORDER — HEPARIN BOLUS VIA INFUSION
4000.0000 [IU] | Freq: Once | INTRAVENOUS | Status: AC
  Administered 2014-03-17: 4000 [IU] via INTRAVENOUS
  Filled 2014-03-17: qty 4000

## 2014-03-17 MED ORDER — HEPARIN (PORCINE) IN NACL 100-0.45 UNIT/ML-% IJ SOLN
2000.0000 [IU]/h | INTRAMUSCULAR | Status: DC
  Administered 2014-03-17: 2000 [IU]/h via INTRAVENOUS

## 2014-03-17 MED ORDER — FENTANYL CITRATE 0.05 MG/ML IJ SOLN
INTRAMUSCULAR | Status: AC
  Filled 2014-03-17: qty 6

## 2014-03-17 MED ORDER — IOHEXOL 300 MG/ML  SOLN
100.0000 mL | Freq: Once | INTRAMUSCULAR | Status: AC | PRN
  Administered 2014-03-17: 1 mL via INTRAVENOUS

## 2014-03-17 MED ORDER — PNEUMOCOCCAL VAC POLYVALENT 25 MCG/0.5ML IJ INJ
0.5000 mL | INJECTION | INTRAMUSCULAR | Status: DC
  Filled 2014-03-17: qty 0.5

## 2014-03-17 MED ORDER — SODIUM CHLORIDE 0.9 % IV SOLN
12.0000 mg | Freq: Once | INTRAVENOUS | Status: AC
  Administered 2014-03-17: 12 mg via INTRAVENOUS
  Filled 2014-03-17: qty 12

## 2014-03-17 MED ORDER — MIDAZOLAM HCL 2 MG/2ML IJ SOLN
INTRAMUSCULAR | Status: AC | PRN
  Administered 2014-03-17 (×3): 1 mg via INTRAVENOUS

## 2014-03-17 MED ORDER — INSULIN ASPART 100 UNIT/ML ~~LOC~~ SOLN
0.0000 [IU] | Freq: Three times a day (TID) | SUBCUTANEOUS | Status: DC
  Administered 2014-03-18 (×2): 7 [IU] via SUBCUTANEOUS
  Administered 2014-03-18: 15 [IU] via SUBCUTANEOUS
  Administered 2014-03-18: 7 [IU] via SUBCUTANEOUS
  Administered 2014-03-19: 4 [IU] via SUBCUTANEOUS
  Administered 2014-03-19 (×3): 7 [IU] via SUBCUTANEOUS
  Administered 2014-03-20: 4 [IU] via SUBCUTANEOUS
  Administered 2014-03-20 (×2): 7 [IU] via SUBCUTANEOUS
  Administered 2014-03-20 – 2014-03-21 (×2): 4 [IU] via SUBCUTANEOUS

## 2014-03-17 MED ORDER — ALTEPLASE 50 MG IV SOLR
12.0000 mg | Freq: Once | INTRAVENOUS | Status: AC
  Administered 2014-03-17 (×2): 12 mg via INTRAVENOUS
  Filled 2014-03-17: qty 12

## 2014-03-17 MED ORDER — SODIUM CHLORIDE 0.9 % IV SOLN
INTRAVENOUS | Status: DC
  Administered 2014-03-17 (×2): via INTRAVENOUS

## 2014-03-17 MED FILL — Morphine Sulfate Inj 10 MG/ML: INTRAMUSCULAR | Qty: 1 | Status: AC

## 2014-03-17 NOTE — Progress Notes (Signed)
Extensive discussion with Rosaria Ferriesfiancee Karen, sisters x 2 and brother x 1   Explained submassive PE and prognosis.  Explained current Rx of IV heparin just started  Explained role of lytics - cath directed (newer with less data), and systemic and risk for bleed (small but definite risk)  Currently they are all shell shocked   - they prefer to Rx with IV heparin - but if heparin levels subtherapeutic x 1-2, lacate//trop high, still hypoxemic , Low bp - they agree to lysis - with discretion of cath v systemic upon us  - currently no contra indication for lysis     Dr. Kalman ShanMurali Nasya Vincent, M.D., [facility]C.P Pulmonary and Critical Care Medicine Staff Physician [facility] [place] Pulmonary and Critical Care Pager: [phone], If no answer or between  15:00h - 7:00h: call [phone]  03/17/2014 12:35 AM

## 2014-03-17 NOTE — Progress Notes (Signed)
ANTICOAGULATION CONSULT NOTE - FOLLOW UP    HL = 0.27 and 0.21 (goal 0.3 - 0.7 units/mL) Heparin dosing weight = 111 kg   Assessment: 38 YOM continues on IV heparin for new PE.  Alteplase now discontinued.  Heparin level sub-therapeutic although it has not been interrupted per IR MD and RN.  No bleeding reported.   Plan: - Increase heparin gtt to 2150 units/hr - Check 6 hr HL   Deone Omahoney D. Laney Potashang, PharmD, BCPS Pager:  [phone] 03/17/2014, 10:37 PM

## 2014-03-17 NOTE — Progress Notes (Signed)
Inpatient Diabetes Program Recommendations  AACE/ADA: New Consensus Statement on Inpatient Glycemic Control (2013)  Target Ranges:  Prepandial:   less than 140 mg/dL      Peak postprandial:   less than 180 mg/dL (1-2 hours)      Critically ill patients:  140 - 180 mg/dL   Results for Jordan Cline, CHRIS S (MRN [id number]) as of 03/17/2014 07:53  Ref. Range 03/16/2014 19:35 03/16/2014 23:50 03/17/2014 06:05  Glucose Latest Range: 70-99 mg/dL 409344 (H) 811383 (H) 914308 (H)    Reason for Visit: Submassive PE  Diabetes history: Borderline DM Outpatient Diabetes medications: None Current orders for Inpatient glycemic control: Novolog 0-20 units (resistant scale) TID with meals  Inpatient Diabetes Program Recommendations Insulin - IV drip/GlucoStabilizer: Patient's blood glucose has been above 300 mg/dl. Please consider starting the ICU Glycemic Control order set Phase 2 with IV insulin. HgbA1C: Patient has a history of being a "borderline diabetic," Please consider obtaining an A1c to assess glycemic control over the past 2-3 months.  Thanks,  Christena DeemShannon Gevon Markus RN, MSN, [facility] Inpatient Diabetes Coordinator Team Pager [phone]

## 2014-03-17 NOTE — Progress Notes (Signed)
Pt transferred back to his room in 38M on non-re breather and O2. Test is complete, pressures doctumented. Sheath was pulled, pressure applied to site by Rad tech, along with pressure dressing. Site appears WDL. Instructions and teaching given to the Pt and his receiving RN regarding keeping his rt leg straight for 4 hrs and holding pressure to the sight when needing to cough. Site also assessed upon arrival to the room by this RN, Theatre stage managerad tech and receiving RN. Site still appeared WDL.

## 2014-03-17 NOTE — Progress Notes (Signed)
Pt transported to the IR room for test, accompanied by 2 RN's, and 2 Rad techs. Pt is a/o, verbal and c/o reocurent chest tightness when lying flat, pt was repositioned upon arrival to the IR room to relieve chest discomfort. He is transported on an a non-re breather and O2 saturating at 95%. He did not appear to have any s/s of respiratory distress. TPA is complete at this time.

## 2014-03-17 NOTE — Progress Notes (Addendum)
Pt's arterial pressures are as follows:  1747: Left Pulmonary Artery: 47/33, mean of 39 1748: Main Pulmonary Artery: 49/28, mean of 37

## 2014-03-17 NOTE — Procedures (Signed)
Successful insertion of bilateral pulmonary artery EKOS infusion catheters for PE lysis Infusion will begin at 1mg /hr through each catheter for 12 hours, total dose 24 mg  Initial PA pressure:  54/25  ?ASD--catheter and guidewire could access pulmonary veins from rt atrial access rec ECHO  D/w Deterding

## 2014-03-17 NOTE — Progress Notes (Signed)
39yo male slightly supratherapeutic (0.72) on heparin with initial dosing for submassive PE, though did receive 8000 units boluses; will not change for now as may drift down and check with next Q6H labs.  Vernard GamblesVeronda Keslie Gritz, PharmD, BCPS 03/17/2014 7:59 AM

## 2014-03-17 NOTE — Progress Notes (Signed)
Pharmacy notified patient returned to 2M13 for heparin review and orders.

## 2014-03-17 NOTE — Progress Notes (Signed)
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 Last few weeks has been having dyspnea, cough and passed it off as bronchitis. Some worse this past week along with gerd and atypical chest pain. On 03/16/14 upon entering car after leaving office building became very dyspneic, light headed and felt like passing out. Went [facility] - and submassive PE with RV strain on CT angio confirmed (duplex LE data not available). Initial troponin normal. Started on IV heparin and transf[facility] to [facility]. Prior to transfer decompensated from [place] o2 to 100% face mask (? 15L via NRB). Dyspneic and anxious upon arrival in [facility]SICU at [facility]. PCCM admitting 03/16/2014 . Denies prololnged travel   SUBJECTIVE:  Feels less SOB Still requiring 1.00 NRB mask Currently undergoing catheter directed lysis  VITAL SIGNS: Temp:  [97.7 F (36.5 C)-99.7 F (37.6 C)] 98 F (36.7 C) (02/02 1124) Pulse Rate:  [117-148] 117 (02/02 1100) Resp:  [15-33] 23 (02/02 1100) BP: (111-200)/(47-179) 131/96 mmHg (02/02 1100) SpO2:  [84 %-98 %] 98 % (02/02 1100) FiO2 (%):  [80 %-100 %] 80 % (02/02 0600) Weight:  [145.151 kg (320 lb)] 145.151 kg (320 lb) (02/01 1915) HEMODYNAMICS:   VENTILATOR SETTINGS: Vent Mode:  [-]  FiO2 (%):  [80 %-100 %] 80 % INTAKE / OUTPUT:  Intake/Output Summary (Last 24 hours) at 03/17/14 1237 Last data filed at 03/17/14 0700  Gross per 24 hour  Intake    563 ml  Output   1750 ml  Net  -1187 ml    PHYSICAL EXAMINATION: General:  Obese male. Comfortable   Neuro:  Alert and oriented x 3. Speech normal. Moves all 4s. Not confused Psych: anxious HEENT:  Neck supple. No elevated JVP. No neck nodes Cardiovascular:  HR 137 Lungs:  CTA bilateraly. RR 27. Speaks full sentences. No acc muscle use. On face mask o2 Abdomen:  Obese. Soft. No mass Musculoskeletal:  No cyanosis. No clubbing. No edema.  Skin:  Intact but dry  LABS:  CBC  Recent Labs Lab 03/16/14 2350 03/17/14 0605 03/17/14 0854  WBC 9.7 7.5 8.7  HGB 15.3 13.4 13.3  HCT 42.9 39.1 38.1*  PLT 232 204 229   Coag's  Recent Labs Lab 03/16/14 2350  INR 1.15   BMET  Recent Labs Lab 03/16/14 1935 03/16/14 2350 03/17/14 0605  NA 132* 133* 135  K 3.6 4.5 3.9  CL 99 100 106  CO2 24 20 21   BUN 10 8 6   CREATININE 0.95 1.05 0.91  GLUCOSE 344* 383* 308*   Electrolytes  Recent Labs Lab 03/16/14 1935 03/16/14 2350 03/17/14 0605  CALCIUM 9.1 9.3 7.8*  MG  --  1.6 1.5  PHOS  --  3.9 3.5   Sepsis Markers  Recent Labs Lab 03/16/14 2350 03/17/14 0605  LATICACIDVEN 2.4* 1.6   ABG  Recent Labs Lab 03/16/14 2153  PHART 7.407  PCO2ART 31.8*  PO2ART 50.0*   Liver Enzymes  Recent Labs Lab 03/16/14 1935  AST 27  ALT 28  ALKPHOS  101  BILITOT 0.5  ALBUMIN 4.2   Cardiac Enzymes  Recent Labs Lab 03/16/14 1935 03/16/14 2350 03/17/14 0605  TROPONINI 0.03 0.22* 0.35*   Glucose  Recent Labs Lab 03/16/14 2326 03/17/14 0807  GLUCAP 347* 182*    Imaging Ct Angio Chest Pe W/cm &/or Wo Cm  03/16/2014   CLINICAL DATA:  Shortness of breath, difficulty breathing.  EXAM: CT ANGIOGRAPHY CHEST WITH CONTRAST  TECHNIQUE: Multidetector CT imaging of the chest was performed using the standard protocol during bolus administration of intravenous contrast. Multiplanar CT image reconstructions and MIPs were obtained to evaluate the vascular anatomy.  CONTRAST:  OMNIPAQUE IOHEXOL 350 MG/ML SOLN  COMPARISON:  None.  FINDINGS: There is a saddle embolus noted with  extension of the emboli predominantly into the lower lobes bilaterally, but also noted in both upper lobes. The right ventricle to left ventricle ratio is elevated at 1.59 suggesting right ventricular strain. Heart is normal size. Aorta is normal caliber. No mediastinal, hilar, or axillary adenopathy. Chest wall soft tissues are unremarkable.  Airspace opacification noted in the lingula. There appears to be embolus within the subtending pulmonary artery suggesting this could represent pulmonary infarct. Trace left pleural effusion noted.  Imaging into the upper abdomen shows no acute findings. There appears to be diffuse fatty infiltration of the liver.  Review of the MIP images confirms the above findings.  IMPRESSION: Saddle embolus would large emboli extending into both lower lobes. Emboli also noted in both upper lobes. Possible early lingular pulmonary infarct.  Elevated RV to LV ratio suggest right heart strain and (at least) sub massive pulmonary embolus.  Critical Value/emergent results were called by telephone at the time of interpretation on 03/16/2014 at 9:53 pm to Dr. Arby Barrette , who verbally acknowledged these results.   Electronically Signed   By: Charlett Nose M.D.   On: 03/16/2014 21:55   Dg Chest Port 1 View  03/16/2014   CLINICAL DATA:  Acute onset of shortness of breath and difficulty breathing. Initial encounter.  EXAM: PORTABLE CHEST - 1 VIEW  COMPARISON:  None.  FINDINGS: The lungs are well-aerated. Vascular congestion is noted. Mildly increased interstitial markings could reflect mild interstitial edema. There is no evidence of pleural effusion or pneumothorax.  The cardiomediastinal silhouette is within normal limits. No acute osseous abnormalities are seen.  IMPRESSION: Vascular congestion noted. Mildly increased interstitial markings could reflect mild interstitial edema, given the patient's symptoms.   Electronically Signed   By: Roanna Raider M.D.   On: 03/16/2014 20:27   CT angio  2/116 - submassive central PE with RV strain  ASSESSMENT / PLAN:  PULMONARY  A:  - Submassive PE - central saddle PE with distribution to bilateral UL and LL -  RV strain on ECHO but initial troponin and bnp normal  - Severity: Class 4 of 5 PESI score  - HIGH SCORE  - Acute hypoxemic respiratory failure    - Risk factor: obese, smoker, sedentary - sits > 8h/day, grandmother with PE (possibly)  P:   IV heparin therapy  Catheter directed lytics underway Follow PAP's post-lysis Follow closely in ICU TTE on 2/3  CARDIOVASCULAR A: at risk for hemodynamic collapse, no evidence thus far P:  monitor closely  RENAL A:  Nil acute P:   Check lytes  GASTROINTESTINAL A:  hx of gerd P:   Npo ppi  HEMATOLOGIC A:  R popliteal DVT At risk for bleeding with anticoagulation P:  Heparin with transition to oral  anticoag when lytics completed. Will likely need lifelong  INFECTIOUS A:  No evidence of infection P:   monitor  ENDOCRINE A:  DM P:   ssi  NEUROLOGIC A:  intact P:   monitior  FAMILY  - Updates: Patient and fiancee updated at bedside 2/2  - Inter-disciplinary family meet or Palliative Care meeting due by:03/23/14  Independent CC time 35 minutes  Levy Pupa, MD, PhD 03/17/2014, 12:47 PM [facility] [phone] or if no answer [phone]

## 2014-03-17 NOTE — Progress Notes (Signed)
Bilateral lower extremity venous duplex completed.  Right:  DVT noted in the popliteal vein.  No evidence of superficial thrombosis.  No Baker's cyst.  Left:  No evidence of DVT, superficial thrombosis, or Baker's cyst.   

## 2014-03-17 NOTE — Progress Notes (Signed)
Called back to see patient - mom [place] from [place] at bedside  S: patient still with class 4 dyspnea./ Not feeling better  O intermittent SBP 180 with MAP almost 110. On 100% Face mask.   Labs  Recent Labs Lab 03/16/14 2350  LATICACIDVEN 2.4*      Recent Labs Lab 03/16/14 1935 03/16/14 2350  TROPONINI 0.03 0.22*    Now having trop leak and lactic acodis  A Submassive PE High clot burden  P Cath directed lysis -Rx. - good expertise at our center, lower dose to reduce risk of bleed and hopefully prevent long term complications Avoid systemic lysuis due to occ bp 180 sbp and cut down risk for bleed  Benefits, risks, limitations explained. They all agree to proceed    The patient is critically ill with multiple organ systems failure and requires high complexity decision making for assessment and support, frequent evaluation and titration of therapies, application of advanced monitoring technologies and extensive interpretation of multiple databases.   Critical Care Time devoted to patient care services described in this note is  45  Minutes (2 x family meeints). This time reflects time of care of this signee Dr Kalman ShanMurali Annjeanette Sarwar. This critical care time does not reflect procedure time, or teaching time or supervisory time of PA/NP/Med student/Med Resident etc but could involve care discussion time    Dr. Kalman ShanMurali Adelynne Joerger, M.D., [facility] Pulmonary and Critical Care Medicine Staff Physician [facility] [place] [facility] Pager: [phone], If no answer or between  15:00h - 7:00h: call [phone]  03/17/2014 1:50 AM

## 2014-03-17 NOTE — Procedures (Signed)
Completion of 12 hour TPA infusion thru bilateral pulmonary artery infusion catheters.  Post treatment PA pressure is 49/28, mean 37 in main pulmonary artery.  Vascular sheaths removed.  No immediate complication.  Return to ICU and continue IV heparin.

## 2014-03-17 NOTE — Progress Notes (Signed)
CRITICAL VALUE ALERT  Critical value received:  Lactic Acid 2.4  Date of notification:  03-17-14  Time of notification:  0115  Critical value read back:Yes.    Nurse who received alert:  Ron AgeeElizabeth Rishita Petron    MD notified (1st page): M. Ramaswamy   Time of first page:  0115  MD notified (2nd page):  Time of second page:  Responding MD:  Lavinia SharpsM Ramaswamy  Time MD responded:  [phone]

## 2014-03-17 NOTE — Progress Notes (Signed)
Attempted echo. The images are undiagnostic with the ECOS machine running. Echo will come back tomorrow.

## 2014-03-17 NOTE — Progress Notes (Signed)
ANTICOAGULATION CONSULT NOTE - Follow Up Consult  Pharmacy Consult for Heparin  Indication: Pulmonary embolism   No Known Allergies  Patient Measurements: Height: 6' (182.9 cm) Weight: (!) 320 lb (145.151 kg) IBW/kg (Calculated) : 77.6 Heparin Dosing Weight: 111 kg   Vital Signs: Temp: 98 F (36.7 C) (02/02 1124) Temp Source: Oral (02/02 1124) BP: 114/72 mmHg (02/02 1200) Pulse Rate: 120 (02/02 1200)  Labs:  Recent Labs  03/16/14 1935 03/16/14 2350 03/17/14 0605 03/17/14 0854 03/17/14 1200  HGB 15.3 15.3 13.4 13.3  --   HCT 43.1 42.9 39.1 38.1*  --   PLT 243 232 204 229  --   LABPROT  --  14.8  --   --   --   INR  --  1.15  --   --   --   HEPARINUNFRC  --   --  0.72*  --  0.77*  CREATININE 0.95 1.05 0.91  --   --   TROPONINI 0.03 0.22* 0.35*  --   --     Estimated Creatinine Clearance: 164.4 mL/min (by C-G formula based on Cr of 0.91).   Medical History: Past Medical History  Diagnosis Date  . Borderline diabetic   . Borderline hypertension     Medications:  No prescriptions prior to admission    Assessment: 39yo male remains supratherapeutic (0.77) on heparin dosing for submassive PE. No s/s of bleeding noted. tPA scheduled to likely stop around 1530 today after patient examined by IR. Patient then planned to return to IR for likely sheath removal today after heparin is held for around 4 hours. No s/s of bleeding observed.   CT also shows elevated RV to LV ratio suggest right heart strain and (at least) sub massive pulmonary embolus.  H/H and Plt wnl. No anticoagulants prior to admission  Goal of Therapy:  Heparin level 0.3-0.7 units/ml Monitor platelets by anticoagulation protocol: Yes   Plan:  -Decrease heparin infusion slightly to 2000 units/hr.  -F/u resuming heparin after patient is taken to IR  -Patient will likely need lifetime anti-coag. F/u oral coag options  -Monitor for s/s of bleeding   Vinnie LevelBenjamin Fleet Higham, PharmD., BCPS Clinical  Pharmacist Pager [phone]

## 2014-03-18 DIAGNOSIS — I2699 Other pulmonary embolism without acute cor pulmonale: Secondary | ICD-10-CM

## 2014-03-18 LAB — BASIC METABOLIC PANEL
Anion gap: 9 (ref 5–15)
BUN: 6 mg/dL (ref 6–23)
CO2: 24 mmol/L (ref 19–32)
Calcium: 8.6 mg/dL (ref 8.4–10.5)
Chloride: 104 mmol/L (ref 96–112)
Creatinine, Ser: 1.06 mg/dL (ref 0.50–1.35)
GFR calc Af Amer: >90 mL/min (ref >90)
GFR calc non Af Amer: 88 mL/min — ABNORMAL LOW (ref >90)
GLUCOSE: 278 mg/dL — AB (ref 70–99)
Potassium: 4 mmol/L (ref 3.5–5.1)
SODIUM: 137 mmol/L (ref 135–145)

## 2014-03-18 LAB — GLUCOSE, CAPILLARY
GLUCOSE-CAPILLARY: 201 mg/dL — AB (ref 70–99)
GLUCOSE-CAPILLARY: 218 mg/dL — AB (ref 70–99)
Glucose-Capillary: 327 mg/dL — ABNORMAL HIGH (ref 70–99)
Glucose-Capillary: 250 mg/dL — ABNORMAL HIGH (ref 70–99)

## 2014-03-18 LAB — CBC
HCT: 37.8 % — ABNORMAL LOW (ref 39.0–52.0)
Hemoglobin: 12.8 g/dL — ABNORMAL LOW (ref 13.0–17.0)
MCH: 26.3 pg (ref 26.0–34.0)
MCHC: 33.9 g/dL (ref 30.0–36.0)
MCV: 77.8 fL — ABNORMAL LOW (ref 78.0–100.0)
Platelets: 187 10*3/uL (ref 150–400)
RBC: 4.86 MIL/uL (ref 4.22–5.81)
RDW: 14.2 % (ref 11.5–15.5)
WBC: 9.7 10*3/uL (ref 4.0–10.5)

## 2014-03-18 LAB — HEPARIN LEVEL (UNFRACTIONATED)
HEPARIN UNFRACTIONATED: 0.53 [IU]/mL (ref 0.30–0.70)
Heparin Unfractionated: 0.31 IU/mL (ref 0.30–0.70)

## 2014-03-18 MED ORDER — INFLUENZA VAC SPLIT QUAD 0.5 ML IM SUSY
0.5000 mL | PREFILLED_SYRINGE | INTRAMUSCULAR | Status: DC
  Filled 2014-03-18: qty 0.5

## 2014-03-18 MED ORDER — PNEUMOCOCCAL VAC POLYVALENT 25 MCG/0.5ML IJ INJ
0.5000 mL | INJECTION | INTRAMUSCULAR | Status: DC
  Filled 2014-03-18: qty 0.5

## 2014-03-18 NOTE — Progress Notes (Signed)
  Echocardiogram 2D Echocardiogram has been performed.  Janalyn HarderWest, Wilhelmina Hark R 03/18/2014, 11:06 AM

## 2014-03-18 NOTE — Progress Notes (Signed)
Attempted to call report to 2W. Nurse will call back.

## 2014-03-18 NOTE — Progress Notes (Signed)
ANTICOAGULATION CONSULT NOTE - Follow Up Consult  Pharmacy Consult for Heparin  Indication: pulmonary embolus, saddle   No Known Allergies  Patient Measurements: Height: 6' (182.9 cm) Weight: (!) 321 lb 3.4 oz (145.7 kg) IBW/kg (Calculated) : 77.6  Vital Signs: Temp: 98 F (36.7 C) (02/03 1246) Temp Source: Axillary (02/03 1246) BP: 132/71 mmHg (02/03 1400) Pulse Rate: 111 (02/03 1400)  Labs:  Recent Labs  03/16/14 2350 03/17/14 0605  03/17/14 1500  03/17/14 2200 03/18/14 0422 03/18/14 0426 03/18/14 1429  HGB 15.3 13.4  < > 13.5  --  12.3* 12.8*  --   --   HCT 42.9 39.1  < > 38.6*  --  35.8* 37.8*  --   --   PLT 232 204  < > 180  --  168 187  --   --   LABPROT 14.8  --   --   --   --   --   --   --   --   INR 1.15  --   --   --   --   --   --   --   --   HEPARINUNFRC  --  0.72*  < >  --   < > 0.21*  --  0.31 0.53  CREATININE 1.05 0.91  --   --   --   --  1.06  --   --   TROPONINI 0.22* 0.35*  --  0.27*  --   --   --   --   --   < > = values in this interval not displayed.  Estimated Creatinine Clearance: 141.4 mL/min (by C-G formula based on Cr of 1.06).   Assessment: 39 y/o M with sub-massive saddle PE, s/p lytic therapy.  HL is therapeutic, Hgb 12.8, other labs as above. No issues per RN.   Goal of Therapy:  Heparin level 0.3-0.7 units/ml Monitor platelets by anticoagulation protocol: Yes   Plan:  -Continue heparin at 2250 units/hr. -Daily CBC/HL -Monitor for bleeding -F/U plans for oral anti-coagulation   Link SnufferJessica Emberlynn Riggan, PharmD, BCPS Clinical Pharmacist [phone] 03/18/2014,3:08 PM

## 2014-03-18 NOTE — Progress Notes (Signed)
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 Last few weeks has been having dyspnea, cough and passed it off as bronchitis. Some worse this past week along with gerd and atypical chest pain. On 03/16/14 upon entering car after leaving office building became very dyspneic, light headed and felt like passing out. [facility] - and submassive PE with RV strain on CT angio confirmed (duplex LE data not available). Initial troponin normal. Started on IV heparin and transf[facility]. Prior to transfer decompensated from [place] o2 to 100% face mask (? 15L via NRB). Dyspneic and anxious upon arrival in [facility]SICU [facility]. PCCM admitting 03/16/2014 . Denies prololnged travel  SUBJECTIVE:  Feels less SOB, but still requiring [place] O2. Denies CP.   VITAL SIGNS: Temp:  [97.4 F (36.3 C)-100.4 F (38 C)] 99 F (37.2 C) (02/03 0413) Pulse Rate:  [106-123] 106 (02/03 0800) Resp:  [16-26] 16 (02/03 0800) BP: (90-137)/(48-96) 111/59 mmHg (02/03 0800) SpO2:  [88 %-98 %] 90 % (02/03 0800) FiO2 (%):  [80 %] 80 % (02/03 0000) Weight:  [321 lb 3.4 oz (145.7 kg)] 321 lb 3.4 oz (145.7 kg) (02/03 0500) HEMODYNAMICS:   VENTILATOR SETTINGS: Vent Mode:  [-]  FiO2 (%):  [80 %] 80 % INTAKE / OUTPUT:  Intake/Output Summary (Last 24 hours) at 03/18/14 [id number] Last data filed at 03/18/14 0800  Gross per 24 hour  Intake 3379.95 ml  Output   3070 ml  Net 309.95 ml    PHYSICAL EXAMINATION: General:  Obese male. Comfortable  Neuro:  Alert and oriented x  3. Speech normal. Moves all 4s. Not confused HEENT:  Neck supple. No elevated JVP.  Cardiovascular: Tachy wit reg rhythm  Lungs:  CTA bilateraly. Speaks full sentences. No acc muscle use. On  o2 Abdomen:  Obese. Soft. No mass Musculoskeletal:  No cyanosis. No clubbing. No edema.  Skin:  Intact but dry  LABS:  CBC  Recent Labs Lab 03/17/14 1500 03/17/14 2200 03/18/14 0422  WBC 9.3 9.9 9.7  HGB 13.5 12.3* 12.8*  HCT 38.6* 35.8* 37.8*  PLT 180 168 187   Coag's  Recent Labs Lab 03/16/14 2350  INR 1.15   BMET  Recent Labs Lab 03/16/14 2350 03/17/14 0605 03/18/14 0422  NA 133* 135 137  K 4.5 3.9 4.0  CL 100 106 104  CO2 20 21 24   BUN 8 6 6   CREATININE 1.05 0.91 1.06  GLUCOSE 383* 308* 278*   Electrolytes  Recent Labs Lab 03/16/14 2350 03/17/14 0605 03/18/14 0422  CALCIUM 9.3 7.8* 8.6  MG 1.6 1.5  --   PHOS 3.9 3.5  --    Sepsis Markers  Recent Labs Lab 03/16/14 2350 03/17/14 0605  LATICACIDVEN 2.4* 1.6   ABG  Recent Labs Lab 03/16/14 2153  PHART 7.407  PCO2ART 31.8*  PO2ART 50.0*   Liver Enzymes  Recent Labs Lab 03/16/14 1935  AST 27  ALT 28  ALKPHOS 101  BILITOT 0.5  ALBUMIN 4.2  Cardiac Enzymes  Recent Labs Lab 03/16/14 2350 03/17/14 0605 03/17/14 1500  TROPONINI 0.22* 0.35* 0.27*   Glucose  Recent Labs Lab 03/16/14 2326 03/17/14 0807 03/17/14 1235 03/17/14 1522 03/17/14 2202 03/18/14 0827  GLUCAP 347* 182* 277* 264* 236* 201*    Imaging Ir Rande Lawman F/u Eval Art/ven Final Day (ms)  03/18/2014   CLINICAL DATA:  39 year old with bilateral pulmonary emboli. Follow-up bilateral pulmonary artery 12 hr tPA infusion.  EXAM: FOLLOW-UP THROMBOLYTIC THERAPY WITH FLUOROSCOPY  Physician: Rachelle Hora. Henn, MD  FLUOROSCOPY TIME:  48 seconds, 29.2 mGy  MEDICATIONS: None  ANESTHESIA/SEDATION: Moderate sedation time: None  PROCEDURE: Patient was brought into the interventional suite. The wire was removed from the left pulmonary artery  infusion catheter. The left pulmonary artery infusion catheter was connected to a transducer. Catheter was pulled back into the main left pulmonary artery. Catheter was further pulled back into the main pulmonary artery. Pressures were obtained from the left pulmonary artery catheter. The entire catheter was removed. The right pulmonary catheter was removed. Both right venous sheath were removed with manual compression. Manual compression was applied to the puncture sites. Patient was transferred back to the intensive care unit.  FINDINGS: Left pulmonary artery pressure: 47/33 mmHg, mean 39  Main pulmonary artery:  49/28 mmHg, mean 37  Stable position of the bilateral pulmonary artery catheters. Catheters are located in the lower lobe branches.  COMPLICATIONS: None  IMPRESSION: Completion of the catheter-directed thrombolysis for bilateral pulmonary emboli.  Minimal decrease in the pulmonary artery pressures following the thrombolytic infusion.  Removal of the pulmonary artery catheters and right groin sheaths.   Electronically Signed   By: Richarda Overlie M.D.   On: 03/18/2014 08:41   CT angio 2/116 - submassive central PE with RV strain  ASSESSMENT / PLAN:  PULMONARY  A:  - Submassive PE - central saddle PE with distribution to bilateral UL and LL -  RV strain on ECHO but initial troponin and bnp normal  - Severity: Class 4 of 5 PESI score  - HIGH SCORE  - Acute hypoxemic respiratory failure    - Risk factor: obese, smoker, sedentary - sits > 8h/day, grandmother with PE (possibly)  P:   IV heparin therapy; S/p Catheter directed lytics. Consider long-term anticoag options Follow PAP's post-lysis Continue [place] O2 to maintain sats > 95% Transfer to floow  CARDIOVASCULAR A: at risk for hemodynamic collapse, no evidence thus far ? Hx of PFO or ASD P:  monitor closely TTE on 2/3 with bubble study  RENAL A:  Nil acute P:   Monitor lytes  GASTROINTESTINAL A:  hx of gerd P:    ppi  HEMATOLOGIC A:  R popliteal DVT At risk for bleeding with anticoagulation P:  Heparin with transition to oral anticoag once he makes his decision   INFECTIOUS A:  No evidence of infection P:   monitor  ENDOCRINE A:  DM P:   Check a1c ssi  NEUROLOGIC A:  intact P:   monitior  FAMILY  - Updates: Patient and fiancee updated at bedside 2/2  - Inter-disciplinary family meet or Palliative Care meeting due by:03/23/14   Jamal Collin, MD 03/18/2014, 9:50 AM PGY-2, [facility]   Attending Note:  I have examined patient, reviewed labs, studies and notes. I have discussed the case with Dr Gayla Doss, and I agree with the data and plans as amended above. He is clinically improving post TPA. Will need to decide which PO anticoagulation he will  use, transition him over. Will move to telemetry today and transfer to triad service as of 2/4.   Levy Pupa, MD, PhD 03/18/2014, 11:28 AM [facility] [phone] or if no answer [phone]

## 2014-03-18 NOTE — Progress Notes (Signed)
ANTICOAGULATION CONSULT NOTE - Follow Up Consult  Pharmacy Consult for Heparin  Indication: pulmonary embolus, saddle   No Known Allergies  Patient Measurements: Height: 6' (182.9 cm) Weight: (!) 320 lb (145.151 kg) IBW/kg (Calculated) : 77.6  Vital Signs: Temp: 99 F (37.2 C) (02/03 0413) Temp Source: Oral (02/03 0413) BP: 106/53 mmHg (02/03 0100) Pulse Rate: 115 (02/03 0100)  Labs:  Recent Labs  03/16/14 1935 03/16/14 2350 03/17/14 0605  03/17/14 1500 03/17/14 1950 03/17/14 2200 03/18/14 0422 03/18/14 0426  HGB 15.3 15.3 13.4  < > 13.5  --  12.3* 12.8*  --   HCT 43.1 42.9 39.1  < > 38.6*  --  35.8* 37.8*  --   PLT 243 232 204  < > 180  --  168 187  --   LABPROT  --  14.8  --   --   --   --   --   --   --   INR  --  1.15  --   --   --   --   --   --   --   HEPARINUNFRC  --   --  0.72*  < >  --  0.27* 0.21*  --  0.31  CREATININE 0.95 1.05 0.91  --   --   --   --   --   --   TROPONINI 0.03 0.22* 0.35*  --  0.27*  --   --   --   --   < > = values in this interval not displayed.  Estimated Creatinine Clearance: 164.4 mL/min (by C-G formula based on Cr of 0.91).   Assessment: 39 y/o M with sub-massive saddle PE, s/p lytic therapy, HL is on the low end of therapeutic range, Hgb 12.8, other labs as above. No issues per RN.   Goal of Therapy:  Heparin level 0.3-0.7 units/ml Monitor platelets by anticoagulation protocol: Yes   Plan:  -Increase heparin slightly to 2250 units/hr to prevent sub-therapeutic level with large PE -1200 HL -Daily CBC/HL -Monitor for bleeding -F/U plans for oral anti-coagulation   Abran DukeLedford, Weylin Plagge 03/18/2014,4:54 AM

## 2014-03-18 NOTE — Progress Notes (Signed)
Subjective: Pt doing fairly well; has had occ coughing with small amt blood noted; sl improved dyspnea; denies CP, N/V  Objective: Vital signs in last 24 hours: Temp:  [97.4 F (36.3 C)-100.4 F (38 C)] 99 F (37.2 C) (02/03 0413) Pulse Rate:  [106-120] 109 (02/03 0900) Resp:  [16-26] 21 (02/03 0900) BP: (90-137)/(48-92) 130/60 mmHg (02/03 0900) SpO2:  [88 %-98 %] 91 % (02/03 0900) FiO2 (%):  [80 %] 80 % (02/03 0000) Weight:  [321 lb 3.4 oz (145.7 kg)] 321 lb 3.4 oz (145.7 kg) (02/03 0500)    Intake/Output from previous day: 02/02 0701 - 02/03 0700 In: 3702 [I.V.:3702] Out: 2745 [Urine:2745] Intake/Output this shift: Total I/O In: 400 [I.V.:400] Out: 475 [Urine:475]  Pt awake/alert; chest- distant but clear BS bilat ant; heart- tachy but regular; abd- soft, NT; puncture site rt CFV clean and dry,NT, no hematoma; ext- FROM  Lab Results:   Recent Labs  03/17/14 2200 03/18/14 0422  WBC 9.9 9.7  HGB 12.3* 12.8*  HCT 35.8* 37.8*  PLT 168 187   BMET  Recent Labs  03/17/14 0605 03/18/14 0422  NA 135 137  K 3.9 4.0  CL 106 104  CO2 21 24  GLUCOSE 308* 278*  BUN 6 6  CREATININE 0.91 1.06  CALCIUM 7.8* 8.6   PT/INR  Recent Labs  03/16/14 2350  LABPROT 14.8  INR 1.15   ABG  Recent Labs  03/16/14 2153  PHART 7.407  HCO3 20.0    Studies/Results: Ct Angio Chest Pe W/cm &/or Wo Cm  03/16/2014   CLINICAL DATA:  Shortness of breath, difficulty breathing.  EXAM: CT ANGIOGRAPHY CHEST WITH CONTRAST  TECHNIQUE: Multidetector CT imaging of the chest was performed using the standard protocol during bolus administration of intravenous contrast. Multiplanar CT image reconstructions and MIPs were obtained to evaluate the vascular anatomy.  CONTRAST:  OMNIPAQUE IOHEXOL 350 MG/ML SOLN  COMPARISON:  None.  FINDINGS: There is a saddle embolus noted with extension of the emboli predominantly into the lower lobes bilaterally, but also noted in both upper lobes. The  right ventricle to left ventricle ratio is elevated at 1.59 suggesting right ventricular strain. Heart is normal size. Aorta is normal caliber. No mediastinal, hilar, or axillary adenopathy. Chest wall soft tissues are unremarkable.  Airspace opacification noted in the lingula. There appears to be embolus within the subtending pulmonary artery suggesting this could represent pulmonary infarct. Trace left pleural effusion noted.  Imaging into the upper abdomen shows no acute findings. There appears to be diffuse fatty infiltration of the liver.  Review of the MIP images confirms the above findings.  IMPRESSION: Saddle embolus would large emboli extending into both lower lobes. Emboli also noted in both upper lobes. Possible early lingular pulmonary infarct.  Elevated RV to LV ratio suggest right heart strain and (at least) sub massive pulmonary embolus.  Critical Value/emergent results were called by telephone at the time of interpretation on 03/16/2014 at 9:53 pm to Dr. Arby Barrette , who verbally acknowledged these results.   Electronically Signed   By: Charlett Nose M.D.   On: 03/16/2014 21:55   Ir Angiogram Pulmonary Bilateral Selective  03/18/2014   CLINICAL DATA:  Acute saddle pulmonary emboli, chest pain, respiratory distress, right heart strain  EXAM: ULTRASOUND GUIDANCE FOR VASCULAR ACCESS  BILATERAL PULMONARY ARTERY CATHETERIZATIONS AND ANGIOGRAMS  SVC VENOGRAM  INITIATION OF TRANSCATHETER ARTERIAL THROMBOLYTIC INFUSION FOR PULMONARY EMBOLUS LYSIS  Date:  2/2/20162/03/2014 5:06 am  Radiologist:  M. Beryle Beams  Shick, MD  Guidance:  Ultrasound and fluoroscopic  FLUOROSCOPY TIME:  25 min 48 seconds  MEDICATIONS AND MEDICAL HISTORY: 3 mg Versed, 150 mcg fentanyl  ANESTHESIA/SEDATION: 90 min  CONTRAST:  OMNIPAQUE IOHEXOL 300 MG/ML  SOLN  COMPLICATIONS: None immediate  PROCEDURE: Informed consent was obtained from the patient following explanation of the procedure, risks, benefits and alternatives. The patient  understands, agrees and consents for the procedure. All questions were addressed. A time out was performed.  Maximal barrier sterile technique utilized including caps, mask, sterile gowns, sterile gloves, large sterile drape, hand hygiene, and Betadine.  Under sterile conditions and local anesthesia, ultrasound micropuncture access was performed of the right common femoral vein. Images obtained for documentation. Tandem 6 French sheaths were inserted at separate puncture sites into the right common femoral vein. Bernstein catheter and Bentson guidewire were utilized to advance the access into the right atrium. Catheter and guidewire access were advanced into the left hilar region initially. Contrast injection confirms catheter access within the left pulmonary vein suspicious for an atrial septal defect. This was retracted and advanced into the right hilum. Again, contrast injection confirms position within the pulmonary vein suspicious for an atrial septal defect.  The catheter was then advanced into the right innominate vein. Contrast injection performed for SVC venogram. Innominate vein and SVC are patent.  Eventually, the Berenstein catheter and guidewire were advanced into the pulmonary outflow tract and more peripherally into the left lower lobe pulmonary artery. Contrast injection confirms position within the pulmonary arterial vasculature. Filling defects present extending into the left lower lobe compatible with acute known pulmonary emboli. Rosen guidewire was advanced into the left lower lobe pulmonary artery. This access was secured and attention was directed to right pulmonary artery catheterization.  In a similar fashion, a second Bernstein catheter and Bentson guidewire were utilized to advance the access into the pulmonary outflow tract and directed to the right lower lobe pulmonary artery. Eventually access was advanced into the right lower lobe. Contrast injection confirms position in the right  lower lobe pulmonary artery. A second Rosen guidewire was advanced into the right lower lobe pulmonary artery.  Pulmonary arterial pressure measurements were obtained. Pulmonary artery pressure 54/25.  Over the [facility] guidewires, the EKOS infusion outer catheters were advanced bilaterally into the lower lobe pulmonary arterial positions. The inner EKOS ultrasound wire was advanced through the delivery catheters. Infusion length on the left is 18 cm and the right 12 cm. Images obtained for documentation. Access secured externally.  TPA thrombolytic infusion will be initiated bilaterally at 1 milligram/hour per catheter for 12 hrs. Total dose 24 mg.  IMPRESSION: Successful insertion of bilateral pulmonary arterial EKOS ultrasound assisted infusion catheters for pulmonary embolus thrombolysis.  findings suspicious for atrial septal defect as detailed above. Recommend follow-up echocardiogram.   Electronically Signed   By: Ruel Favors M.D.   On: 03/18/2014 09:56   Ir Angiogram Selective Each Additional Vessel  03/18/2014   CLINICAL DATA:  Acute saddle pulmonary emboli, chest pain, respiratory distress, right heart strain  EXAM: ULTRASOUND GUIDANCE FOR VASCULAR ACCESS  BILATERAL PULMONARY ARTERY CATHETERIZATIONS AND ANGIOGRAMS  SVC VENOGRAM  INITIATION OF TRANSCATHETER ARTERIAL THROMBOLYTIC INFUSION FOR PULMONARY EMBOLUS LYSIS  Date:  2/2/20162/03/2014 5:06 am  Radiologist:  M. Ruel Favors, MD  Guidance:  Ultrasound and fluoroscopic  FLUOROSCOPY TIME:  25 min 48 seconds  MEDICATIONS AND MEDICAL HISTORY: 3 mg Versed, 150 mcg fentanyl  ANESTHESIA/SEDATION: 90 min  CONTRAST:  OMNIPAQUE IOHEXOL 300 MG/ML  SOLN  COMPLICATIONS: None immediate  PROCEDURE: Informed consent was obtained from the patient following explanation of the procedure, risks, benefits and alternatives. The patient understands, agrees and consents for the procedure. All questions were addressed. A time out was performed.  Maximal barrier sterile  technique utilized including caps, mask, sterile gowns, sterile gloves, large sterile drape, hand hygiene, and Betadine.  Under sterile conditions and local anesthesia, ultrasound micropuncture access was performed of the right common femoral vein. Images obtained for documentation. Tandem 6 French sheaths were inserted at separate puncture sites into the right common femoral vein. Bernstein catheter and Bentson guidewire were utilized to advance the access into the right atrium. Catheter and guidewire access were advanced into the left hilar region initially. Contrast injection confirms catheter access within the left pulmonary vein suspicious for an atrial septal defect. This was retracted and advanced into the right hilum. Again, contrast injection confirms position within the pulmonary vein suspicious for an atrial septal defect.  The catheter was then advanced into the right innominate vein. Contrast injection performed for SVC venogram. Innominate vein and SVC are patent.  Eventually, the Berenstein catheter and guidewire were advanced into the pulmonary outflow tract and more peripherally into the left lower lobe pulmonary artery. Contrast injection confirms position within the pulmonary arterial vasculature. Filling defects present extending into the left lower lobe compatible with acute known pulmonary emboli. Rosen guidewire was advanced into the left lower lobe pulmonary artery. This access was secured and attention was directed to right pulmonary artery catheterization.  In a similar fashion, a second Bernstein catheter and Bentson guidewire were utilized to advance the access into the pulmonary outflow tract and directed to the right lower lobe pulmonary artery. Eventually access was advanced into the right lower lobe. Contrast injection confirms position in the right lower lobe pulmonary artery. A second Rosen guidewire was advanced into the right lower lobe pulmonary artery.  Pulmonary arterial  pressure measurements were obtained. Pulmonary artery pressure 54/25.  Over the [facility] guidewires, the EKOS infusion outer catheters were advanced bilaterally into the lower lobe pulmonary arterial positions. The inner EKOS ultrasound wire was advanced through the delivery catheters. Infusion length on the left is 18 cm and the right 12 cm. Images obtained for documentation. Access secured externally.  TPA thrombolytic infusion will be initiated bilaterally at 1 milligram/hour per catheter for 12 hrs. Total dose 24 mg.  IMPRESSION: Successful insertion of bilateral pulmonary arterial EKOS ultrasound assisted infusion catheters for pulmonary embolus thrombolysis.  findings suspicious for atrial septal defect as detailed above. Recommend follow-up echocardiogram.   Electronically Signed   By: Ruel Favors M.D.   On: 03/18/2014 09:56   Ir Angiogram Selective Each Additional Vessel  03/18/2014   CLINICAL DATA:  Acute saddle pulmonary emboli, chest pain, respiratory distress, right heart strain  EXAM: ULTRASOUND GUIDANCE FOR VASCULAR ACCESS  BILATERAL PULMONARY ARTERY CATHETERIZATIONS AND ANGIOGRAMS  SVC VENOGRAM  INITIATION OF TRANSCATHETER ARTERIAL THROMBOLYTIC INFUSION FOR PULMONARY EMBOLUS LYSIS  Date:  2/2/20162/03/2014 5:06 am  Radiologist:  M. Ruel Favors, MD  Guidance:  Ultrasound and fluoroscopic  FLUOROSCOPY TIME:  25 min 48 seconds  MEDICATIONS AND MEDICAL HISTORY: 3 mg Versed, 150 mcg fentanyl  ANESTHESIA/SEDATION: 90 min  CONTRAST:  OMNIPAQUE IOHEXOL 300 MG/ML  SOLN  COMPLICATIONS: None immediate  PROCEDURE: Informed consent was obtained from the patient following explanation of the procedure, risks, benefits and alternatives. The patient understands, agrees and consents for the procedure. All questions were addressed. A time out  was performed.  Maximal barrier sterile technique utilized including caps, mask, sterile gowns, sterile gloves, large sterile drape, hand hygiene, and Betadine.  Under  sterile conditions and local anesthesia, ultrasound micropuncture access was performed of the right common femoral vein. Images obtained for documentation. Tandem 6 French sheaths were inserted at separate puncture sites into the right common femoral vein. Bernstein catheter and Bentson guidewire were utilized to advance the access into the right atrium. Catheter and guidewire access were advanced into the left hilar region initially. Contrast injection confirms catheter access within the left pulmonary vein suspicious for an atrial septal defect. This was retracted and advanced into the right hilum. Again, contrast injection confirms position within the pulmonary vein suspicious for an atrial septal defect.  The catheter was then advanced into the right innominate vein. Contrast injection performed for SVC venogram. Innominate vein and SVC are patent.  Eventually, the Berenstein catheter and guidewire were advanced into the pulmonary outflow tract and more peripherally into the left lower lobe pulmonary artery. Contrast injection confirms position within the pulmonary arterial vasculature. Filling defects present extending into the left lower lobe compatible with acute known pulmonary emboli. Rosen guidewire was advanced into the left lower lobe pulmonary artery. This access was secured and attention was directed to right pulmonary artery catheterization.  In a similar fashion, a second Bernstein catheter and Bentson guidewire were utilized to advance the access into the pulmonary outflow tract and directed to the right lower lobe pulmonary artery. Eventually access was advanced into the right lower lobe. Contrast injection confirms position in the right lower lobe pulmonary artery. A second Rosen guidewire was advanced into the right lower lobe pulmonary artery.  Pulmonary arterial pressure measurements were obtained. Pulmonary artery pressure 54/25.  Over the [facility] guidewires, the EKOS infusion outer catheters  were advanced bilaterally into the lower lobe pulmonary arterial positions. The inner EKOS ultrasound wire was advanced through the delivery catheters. Infusion length on the left is 18 cm and the right 12 cm. Images obtained for documentation. Access secured externally.  TPA thrombolytic infusion will be initiated bilaterally at 1 milligram/hour per catheter for 12 hrs. Total dose 24 mg.  IMPRESSION: Successful insertion of bilateral pulmonary arterial EKOS ultrasound assisted infusion catheters for pulmonary embolus thrombolysis.  findings suspicious for atrial septal defect as detailed above. Recommend follow-up echocardiogram.   Electronically Signed   By: Ruel Favors M.D.   On: 03/18/2014 09:56   Ir Venocavagram Svc  03/18/2014   CLINICAL DATA:  Acute saddle pulmonary emboli, chest pain, respiratory distress, right heart strain  EXAM: ULTRASOUND GUIDANCE FOR VASCULAR ACCESS  BILATERAL PULMONARY ARTERY CATHETERIZATIONS AND ANGIOGRAMS  SVC VENOGRAM  INITIATION OF TRANSCATHETER ARTERIAL THROMBOLYTIC INFUSION FOR PULMONARY EMBOLUS LYSIS  Date:  2/2/20162/03/2014 5:06 am  Radiologist:  M. Ruel Favors, MD  Guidance:  Ultrasound and fluoroscopic  FLUOROSCOPY TIME:  25 min 48 seconds  MEDICATIONS AND MEDICAL HISTORY: 3 mg Versed, 150 mcg fentanyl  ANESTHESIA/SEDATION: 90 min  CONTRAST:  OMNIPAQUE IOHEXOL 300 MG/ML  SOLN  COMPLICATIONS: None immediate  PROCEDURE: Informed consent was obtained from the patient following explanation of the procedure, risks, benefits and alternatives. The patient understands, agrees and consents for the procedure. All questions were addressed. A time out was performed.  Maximal barrier sterile technique utilized including caps, mask, sterile gowns, sterile gloves, large sterile drape, hand hygiene, and Betadine.  Under sterile conditions and local anesthesia, ultrasound micropuncture access was performed of the right common femoral vein. Images obtained  for documentation. Tandem  6 French sheaths were inserted at separate puncture sites into the right common femoral vein. Bernstein catheter and Bentson guidewire were utilized to advance the access into the right atrium. Catheter and guidewire access were advanced into the left hilar region initially. Contrast injection confirms catheter access within the left pulmonary vein suspicious for an atrial septal defect. This was retracted and advanced into the right hilum. Again, contrast injection confirms position within the pulmonary vein suspicious for an atrial septal defect.  The catheter was then advanced into the right innominate vein. Contrast injection performed for SVC venogram. Innominate vein and SVC are patent.  Eventually, the Berenstein catheter and guidewire were advanced into the pulmonary outflow tract and more peripherally into the left lower lobe pulmonary artery. Contrast injection confirms position within the pulmonary arterial vasculature. Filling defects present extending into the left lower lobe compatible with acute known pulmonary emboli. Rosen guidewire was advanced into the left lower lobe pulmonary artery. This access was secured and attention was directed to right pulmonary artery catheterization.  In a similar fashion, a second Bernstein catheter and Bentson guidewire were utilized to advance the access into the pulmonary outflow tract and directed to the right lower lobe pulmonary artery. Eventually access was advanced into the right lower lobe. Contrast injection confirms position in the right lower lobe pulmonary artery. A second Rosen guidewire was advanced into the right lower lobe pulmonary artery.  Pulmonary arterial pressure measurements were obtained. Pulmonary artery pressure 54/25.  Over the [facility] guidewires, the EKOS infusion outer catheters were advanced bilaterally into the lower lobe pulmonary arterial positions. The inner EKOS ultrasound wire was advanced through the delivery catheters. Infusion  length on the left is 18 cm and the right 12 cm. Images obtained for documentation. Access secured externally.  TPA thrombolytic infusion will be initiated bilaterally at 1 milligram/hour per catheter for 12 hrs. Total dose 24 mg.  IMPRESSION: Successful insertion of bilateral pulmonary arterial EKOS ultrasound assisted infusion catheters for pulmonary embolus thrombolysis.  findings suspicious for atrial septal defect as detailed above. Recommend follow-up echocardiogram.   Electronically Signed   By: Ruel Favors M.D.   On: 03/18/2014 09:56   Ir US Guide Vasc Access Right  03/18/2014   CLINICAL DATA:  Acute saddle pulmonary emboli, chest pain, respiratory distress, right heart strain  EXAM: ULTRASOUND GUIDANCE FOR VASCULAR ACCESS  BILATERAL PULMONARY ARTERY CATHETERIZATIONS AND ANGIOGRAMS  SVC VENOGRAM  INITIATION OF TRANSCATHETER ARTERIAL THROMBOLYTIC INFUSION FOR PULMONARY EMBOLUS LYSIS  Date:  2/2/20162/03/2014 5:06 am  Radiologist:  M. Ruel Favors, MD  Guidance:  Ultrasound and fluoroscopic  FLUOROSCOPY TIME:  25 min 48 seconds  MEDICATIONS AND MEDICAL HISTORY: 3 mg Versed, 150 mcg fentanyl  ANESTHESIA/SEDATION: 90 min  CONTRAST:  OMNIPAQUE IOHEXOL 300 MG/ML  SOLN  COMPLICATIONS: None immediate  PROCEDURE: Informed consent was obtained from the patient following explanation of the procedure, risks, benefits and alternatives. The patient understands, agrees and consents for the procedure. All questions were addressed. A time out was performed.  Maximal barrier sterile technique utilized including caps, mask, sterile gowns, sterile gloves, large sterile drape, hand hygiene, and Betadine.  Under sterile conditions and local anesthesia, ultrasound micropuncture access was performed of the right common femoral vein. Images obtained for documentation. Tandem 6 French sheaths were inserted at separate puncture sites into the right common femoral vein. Bernstein catheter and Bentson guidewire were utilized to  advance the access into the right atrium. Catheter and guidewire access were  advanced into the left hilar region initially. Contrast injection confirms catheter access within the left pulmonary vein suspicious for an atrial septal defect. This was retracted and advanced into the right hilum. Again, contrast injection confirms position within the pulmonary vein suspicious for an atrial septal defect.  The catheter was then advanced into the right innominate vein. Contrast injection performed for SVC venogram. Innominate vein and SVC are patent.  Eventually, the Berenstein catheter and guidewire were advanced into the pulmonary outflow tract and more peripherally into the left lower lobe pulmonary artery. Contrast injection confirms position within the pulmonary arterial vasculature. Filling defects present extending into the left lower lobe compatible with acute known pulmonary emboli. Rosen guidewire was advanced into the left lower lobe pulmonary artery. This access was secured and attention was directed to right pulmonary artery catheterization.  In a similar fashion, a second Bernstein catheter and Bentson guidewire were utilized to advance the access into the pulmonary outflow tract and directed to the right lower lobe pulmonary artery. Eventually access was advanced into the right lower lobe. Contrast injection confirms position in the right lower lobe pulmonary artery. A second Rosen guidewire was advanced into the right lower lobe pulmonary artery.  Pulmonary arterial pressure measurements were obtained. Pulmonary artery pressure 54/25.  Over the [facility] guidewires, the EKOS infusion outer catheters were advanced bilaterally into the lower lobe pulmonary arterial positions. The inner EKOS ultrasound wire was advanced through the delivery catheters. Infusion length on the left is 18 cm and the right 12 cm. Images obtained for documentation. Access secured externally.  TPA thrombolytic infusion will be initiated  bilaterally at 1 milligram/hour per catheter for 12 hrs. Total dose 24 mg.  IMPRESSION: Successful insertion of bilateral pulmonary arterial EKOS ultrasound assisted infusion catheters for pulmonary embolus thrombolysis.  findings suspicious for atrial septal defect as detailed above. Recommend follow-up echocardiogram.   Electronically Signed   By: Ruel Favors M.D.   On: 03/18/2014 09:56   Dg Chest Port 1 View  03/16/2014   CLINICAL DATA:  Acute onset of shortness of breath and difficulty breathing. Initial encounter.  EXAM: PORTABLE CHEST - 1 VIEW  COMPARISON:  None.  FINDINGS: The lungs are well-aerated. Vascular congestion is noted. Mildly increased interstitial markings could reflect mild interstitial edema. There is no evidence of pleural effusion or pneumothorax.  The cardiomediastinal silhouette is within normal limits. No acute osseous abnormalities are seen.  IMPRESSION: Vascular congestion noted. Mildly increased interstitial markings could reflect mild interstitial edema, given the patient's symptoms.   Electronically Signed   By: Roanna Raider M.D.   On: 03/16/2014 20:27   Ir Infusion Thrombol Arterial Initial (ms)  03/18/2014   CLINICAL DATA:  Acute saddle pulmonary emboli, chest pain, respiratory distress, right heart strain  EXAM: ULTRASOUND GUIDANCE FOR VASCULAR ACCESS  BILATERAL PULMONARY ARTERY CATHETERIZATIONS AND ANGIOGRAMS  SVC VENOGRAM  INITIATION OF TRANSCATHETER ARTERIAL THROMBOLYTIC INFUSION FOR PULMONARY EMBOLUS LYSIS  Date:  2/2/20162/03/2014 5:06 am  Radiologist:  M. Ruel Favors, MD  Guidance:  Ultrasound and fluoroscopic  FLUOROSCOPY TIME:  25 min 48 seconds  MEDICATIONS AND MEDICAL HISTORY: 3 mg Versed, 150 mcg fentanyl  ANESTHESIA/SEDATION: 90 min  CONTRAST:  OMNIPAQUE IOHEXOL 300 MG/ML  SOLN  COMPLICATIONS: None immediate  PROCEDURE: Informed consent was obtained from the patient following explanation of the procedure, risks, benefits and alternatives. The patient  understands, agrees and consents for the procedure. All questions were addressed. A time out was performed.  Maximal barrier sterile technique  utilized including caps, mask, sterile gowns, sterile gloves, large sterile drape, hand hygiene, and Betadine.  Under sterile conditions and local anesthesia, ultrasound micropuncture access was performed of the right common femoral vein. Images obtained for documentation. Tandem 6 French sheaths were inserted at separate puncture sites into the right common femoral vein. Bernstein catheter and Bentson guidewire were utilized to advance the access into the right atrium. Catheter and guidewire access were advanced into the left hilar region initially. Contrast injection confirms catheter access within the left pulmonary vein suspicious for an atrial septal defect. This was retracted and advanced into the right hilum. Again, contrast injection confirms position within the pulmonary vein suspicious for an atrial septal defect.  The catheter was then advanced into the right innominate vein. Contrast injection performed for SVC venogram. Innominate vein and SVC are patent.  Eventually, the Berenstein catheter and guidewire were advanced into the pulmonary outflow tract and more peripherally into the left lower lobe pulmonary artery. Contrast injection confirms position within the pulmonary arterial vasculature. Filling defects present extending into the left lower lobe compatible with acute known pulmonary emboli. Rosen guidewire was advanced into the left lower lobe pulmonary artery. This access was secured and attention was directed to right pulmonary artery catheterization.  In a similar fashion, a second Bernstein catheter and Bentson guidewire were utilized to advance the access into the pulmonary outflow tract and directed to the right lower lobe pulmonary artery. Eventually access was advanced into the right lower lobe. Contrast injection confirms position in the right  lower lobe pulmonary artery. A second Rosen guidewire was advanced into the right lower lobe pulmonary artery.  Pulmonary arterial pressure measurements were obtained. Pulmonary artery pressure 54/25.  Over the [facility] guidewires, the EKOS infusion outer catheters were advanced bilaterally into the lower lobe pulmonary arterial positions. The inner EKOS ultrasound wire was advanced through the delivery catheters. Infusion length on the left is 18 cm and the right 12 cm. Images obtained for documentation. Access secured externally.  TPA thrombolytic infusion will be initiated bilaterally at 1 milligram/hour per catheter for 12 hrs. Total dose 24 mg.  IMPRESSION: Successful insertion of bilateral pulmonary arterial EKOS ultrasound assisted infusion catheters for pulmonary embolus thrombolysis.  findings suspicious for atrial septal defect as detailed above. Recommend follow-up echocardiogram.   Electronically Signed   By: Ruel Favors M.D.   On: 03/18/2014 09:56   Ir Rande Lawman F/u Eval Art/ven Final Day (ms)  03/18/2014   CLINICAL DATA:  39 year old with bilateral pulmonary emboli. Follow-up bilateral pulmonary artery 12 hr tPA infusion.  EXAM: FOLLOW-UP THROMBOLYTIC THERAPY WITH FLUOROSCOPY  Physician: Rachelle Hora. Henn, MD  FLUOROSCOPY TIME:  48 seconds, 29.2 mGy  MEDICATIONS: None  ANESTHESIA/SEDATION: Moderate sedation time: None  PROCEDURE: Patient was brought into the interventional suite. The wire was removed from the left pulmonary artery infusion catheter. The left pulmonary artery infusion catheter was connected to a transducer. Catheter was pulled back into the main left pulmonary artery. Catheter was further pulled back into the main pulmonary artery. Pressures were obtained from the left pulmonary artery catheter. The entire catheter was removed. The right pulmonary catheter was removed. Both right venous sheath were removed with manual compression. Manual compression was applied to the puncture sites. Patient  was transferred back to the intensive care unit.  FINDINGS: Left pulmonary artery pressure: 47/33 mmHg, mean 39  Main pulmonary artery:  49/28 mmHg, mean 37  Stable position of the bilateral pulmonary artery catheters. Catheters are located in the  lower lobe branches.  COMPLICATIONS: None  IMPRESSION: Completion of the catheter-directed thrombolysis for bilateral pulmonary emboli.  Minimal decrease in the pulmonary artery pressures following the thrombolytic infusion.  Removal of the pulmonary artery catheters and right groin sheaths.   Electronically Signed   By: Richarda OverlieAdam  Henn M.D.   On: 03/18/2014 08:41    Anti-infectives: Anti-infectives    None      Assessment/Plan: S/p saddle PE/ rt heart strain with catheter directed thrombolysis (TPA), completed 2/2 pm; plans as per CCM; hgb stable at 12.8; creat nl;  monitor closely for signs of sig bleeding; check echo done today  LOS: 2 days    Asal Teas,D [facility] 03/18/2014

## 2014-03-18 NOTE — Progress Notes (Signed)
Lab was not aware that pt was lab draw instead of nurse draw. Heparin level 2 hours late due to this error. I contacted lab at 1412 to come draw the blood level.

## 2014-03-18 NOTE — Care Management Note (Signed)
Benefits check results for anticoag PT COPAY FOR ELIQUIS  WILL BE $2.38. XARELTO $4.33- PRIOR AUTH IS NOT REQUIRED .Vangie BickerBrown, Nakiah Osgood Jane [phone]

## 2014-03-18 NOTE — Progress Notes (Addendum)
Inpatient Diabetes Program Recommendations  AACE/ADA: New Consensus Statement on Inpatient Glycemic Control (2013)  Target Ranges:  Prepandial:   less than 140 mg/dL      Peak postprandial:   less than 180 mg/dL (1-2 hours)      Critically ill patients:  140 - 180 mg/dL   Reason for Visit: Submassive PE  Diabetes history: Borderline DM Outpatient Diabetes medications: None Current orders for Inpatient glycemic control: Novolog 0-20 units (resistant scale) AC  Inpatient Diabetes Program Recommendations  Correction (SSI): While patient is NPO, please change the frequency of the correction scale to Novolog 0-20 units (Resistant scale) Q4 hours. If patient will be eating today, consider ordering Novolog 0-5 units QHS for bedtime coverage.  Basal Insulin: Patient's fasting CBG this am 201 mg/dl. Please consider placing the patient on low dose basal starting at .15 units/kg dosing, Lantus 20 units Daily.  HgbA1C: Patient has a history of being a "borderline diabetic," Please consider obtaining and A1c to assess glycemic control over the past 2-3 months.  Thanks,  Christena DeemShannon Shalandria Elsbernd RN, MSN, [facility] Inpatient Diabetes Coordinator Team Pager [phone]

## 2014-03-19 DIAGNOSIS — Q2112 Patent foramen ovale: Secondary | ICD-10-CM

## 2014-03-19 DIAGNOSIS — Q211 Atrial septal defect: Secondary | ICD-10-CM

## 2014-03-19 LAB — GLUCOSE, CAPILLARY
GLUCOSE-CAPILLARY: 219 mg/dL — AB (ref 70–99)
Glucose-Capillary: 215 mg/dL — ABNORMAL HIGH (ref 70–99)
Glucose-Capillary: 254 mg/dL — ABNORMAL HIGH (ref 70–99)
Glucose-Capillary: 184 mg/dL — ABNORMAL HIGH (ref 70–99)

## 2014-03-19 LAB — CBC
HCT: 34.8 % — ABNORMAL LOW (ref 39.0–52.0)
Hemoglobin: 11.9 g/dL — ABNORMAL LOW (ref 13.0–17.0)
MCH: 26.6 pg (ref 26.0–34.0)
MCHC: 34.2 g/dL (ref 30.0–36.0)
MCV: 77.9 fL — AB (ref 78.0–100.0)
Platelets: 185 10*3/uL (ref 150–400)
RBC: 4.47 MIL/uL (ref 4.22–5.81)
RDW: 14.2 % (ref 11.5–15.5)
WBC: 8 10*3/uL (ref 4.0–10.5)

## 2014-03-19 LAB — HEMOGLOBIN A1C
Hgb A1c MFr Bld: 13 % — ABNORMAL HIGH (ref 4.8–5.6)
MEAN PLASMA GLUCOSE: 326 mg/dL

## 2014-03-19 LAB — HEPARIN LEVEL (UNFRACTIONATED): Heparin Unfractionated: 0.45 IU/mL (ref 0.30–0.70)

## 2014-03-19 MED ORDER — POLYETHYLENE GLYCOL 3350 17 G PO PACK
17.0000 g | PACK | Freq: Two times a day (BID) | ORAL | Status: DC
  Filled 2014-03-19 (×6): qty 1

## 2014-03-19 MED ORDER — RIVAROXABAN 15 MG PO TABS
15.0000 mg | ORAL_TABLET | Freq: Two times a day (BID) | ORAL | Status: DC
  Administered 2014-03-19 – 2014-03-21 (×5): 15 mg via ORAL
  Filled 2014-03-19 (×7): qty 1

## 2014-03-19 MED ORDER — PANTOPRAZOLE SODIUM 40 MG PO TBEC
40.0000 mg | DELAYED_RELEASE_TABLET | Freq: Every day | ORAL | Status: DC
  Filled 2014-03-19: qty 1

## 2014-03-19 MED ORDER — RIVAROXABAN 20 MG PO TABS: 20.0000 mg | ORAL_TABLET | Freq: Every day | ORAL | Status: DC

## 2014-03-19 MED ORDER — SENNA 8.6 MG PO TABS
1.0000 | ORAL_TABLET | Freq: Every day | ORAL | Status: DC
  Filled 2014-03-19 (×3): qty 1

## 2014-03-19 MED ORDER — INSULIN GLARGINE 100 UNIT/ML ~~LOC~~ SOLN
20.0000 [IU] | Freq: Every day | SUBCUTANEOUS | Status: DC
  Administered 2014-03-19 – 2014-03-20 (×2): 20 [IU] via SUBCUTANEOUS
  Filled 2014-03-19 (×3): qty 0.2

## 2014-03-19 MED ORDER — LIVING WELL WITH DIABETES BOOK
Freq: Once | Status: AC
  Administered 2014-03-19: 10:00:00
  Filled 2014-03-19: qty 1

## 2014-03-19 MED ORDER — INSULIN STARTER KIT- PEN NEEDLES (ENGLISH)
1.0000 | Freq: Once | Status: AC
  Administered 2014-03-19: 1
  Filled 2014-03-19: qty 1

## 2014-03-19 NOTE — Discharge Instructions (Signed)
Information on my medicine - XARELTO (rivaroxaban)  This medication education was reviewed with me or my healthcare representative as part of my discharge preparation.    WHY WAS XARELTO PRESCRIBED FOR YOU? Xarelto was prescribed to treat blood clots that may have been found in the veins of your legs (deep vein thrombosis) or in your lungs (pulmonary embolism) and to reduce the risk of them occurring again.  What do you need to know about Xarelto? The starting dose is one 15 mg tablet taken TWICE daily with food for the FIRST 21 DAYS then on 04/09/2014  the dose is changed to one 20 mg tablet taken ONCE A DAY with your evening meal.  DO NOT stop taking Xarelto without talking to the health care provider who prescribed the medication.  Refill your prescription for 20 mg tablets before you run out.  After discharge, you should have regular check-up appointments with your healthcare provider that is prescribing your Xarelto.  In the future your dose may need to be changed if your kidney function changes by a significant amount.  What do you do if you miss a dose? If you are taking Xarelto TWICE DAILY and you miss a dose, take it as soon as you remember. You may take two 15 mg tablets (total 30 mg) at the same time then resume your regularly scheduled 15 mg twice daily the next day.  If you are taking Xarelto ONCE DAILY and you miss a dose, take it as soon as you remember on the same day then continue your regularly scheduled once daily regimen the next day. Do not take two doses of Xarelto at the same time.   Important Safety Information Xarelto is a blood thinner medicine that can cause bleeding. You should call your healthcare provider right away if you experience any of the following: ? Bleeding from an injury or your nose that does not stop. ? Unusual colored urine (red or dark brown) or unusual colored stools (red or black). ? Unusual bruising for unknown reasons. ? A serious fall  or if you hit your head (even if there is no bleeding).  Some medicines may interact with Xarelto and might increase your risk of bleeding while on Xarelto. To help avoid this, consult your healthcare provider or pharmacist prior to using any new prescription or non-prescription medications, including herbals, vitamins, non-steroidal anti-inflammatory drugs (NSAIDs) and supplements.  This website has more information on Xarelto: [email or web address].

## 2014-03-19 NOTE — Progress Notes (Signed)
ANTICOAGULATION CONSULT NOTE - Follow Up Consult  Pharmacy Consult for Heparin  Indication: pulmonary embolus, saddle   No Known Allergies  Patient Measurements: Height: 6' (182.9 cm) Weight: (!) 324 lb 15.3 oz (147.4 kg) IBW/kg (Calculated) : 77.6  Vital Signs: Temp: 99.1 F (37.3 C) (02/04 0422) Temp Source: Oral (02/04 0422) BP: 100/87 mmHg (02/04 0422) Pulse Rate: 104 (02/04 0422)  Labs:  Recent Labs  03/16/14 2350 03/17/14 0605  03/17/14 1500  03/17/14 2200 03/18/14 0422 03/18/14 0426 03/18/14 1429 03/19/14 0500  HGB 15.3 13.4  < > 13.5  --  12.3* 12.8*  --   --  11.9*  HCT 42.9 39.1  < > 38.6*  --  35.8* 37.8*  --   --  34.8*  PLT 232 204  < > 180  --  168 187  --   --  185  LABPROT 14.8  --   --   --   --   --   --   --   --   --   INR 1.15  --   --   --   --   --   --   --   --   --   HEPARINUNFRC  --  0.72*  < >  --   < > 0.21*  --  0.31 0.53 0.45  CREATININE 1.05 0.91  --   --   --   --  1.06  --   --   --   TROPONINI 0.22* 0.35*  --  0.27*  --   --   --   --   --   --   < > = values in this interval not displayed.  Estimated Creatinine Clearance: 142.4 mL/min (by C-G formula based on Cr of 1.06).   Assessment: 39 y/o M with sub-massive saddle PE, s/p lytic therapy.  HL (0.45) is therapeutic, Hgb 11.9, Pltc 185 k. other labs as above. Pharmacy is consulted to transition to xarelto. Scr 1.06, est. crcl > 100 ml/min.  Goal of Therapy:  Monitor platelets by anticoagulation protocol: Yes   Plan:  Xarelto 15 mg PO BID x 21 days Switch to Xarelto 20 mg daily on 2/25 D/C IV heparin  Xarelto education with pharmacist Change protonix to po  Bayard HuggerMei Kerrington Greenhalgh, PharmD, BCPS  Clinical Pharmacist  Pager: [phone]  03/19/2014,11:05 AM

## 2014-03-19 NOTE — Plan of Care (Signed)
Problem: Food- and Nutrition-Related Knowledge Deficit (NB-1.1) Goal: Nutrition education Formal process to instruct or train a patient/client in a skill or to impart knowledge to help patients/clients voluntarily manage or modify food choices and eating behavior to maintain or improve health. Outcome: Completed/Met Date Met:  03/19/14 RD consulted for Diabetes Diet Education  Dietetic Intern provided "Carbohydrate Counting for Diabetes" handout from the Academy of Nutrition and Dietetics and "The Plate Method" Handout.  Discussed food groups and their effect on blood sugar levels.    Discussed what food are considered carbohydrates and reinforced knowledge on carbohydrate counting from education with Diabetes Coordinator.  Discussed the importance of eating 3 meals a day or every 3-4 hours for optimal blood sugar control.    Expect good compliance.  Pt reports his dad has had diabetes for a long period of time and knows a lot about it already.    Body Mass Index (kg/m2) 43.5.  Pt meets criteria for Morbid Obesity based on BMI.   Currently on Heart Healthy/ CHO modified diet and consuming 100% of meals.  No further nutrition interventions warranted at this time.  If additional issues arise please re-consult RD.  Elmer Picker MS Dietetic Intern Pager Number [phone]

## 2014-03-19 NOTE — Progress Notes (Addendum)
[facility] PROGRESS NOTE  Jordan Cline EPP:[id number] DOB: March 06, 1975 DOA: 03/16/2014 PCP: No primary care provider on file.  Assessment/Plan: 1-Saddle PE, possible lingular infarct:  -ECHO; Ventricular septum: D-shaped interventricular septum suggesting RV pressure/volume overload. Atrial septum: Bubble study difficult, possibly positive suggesting PFO. -S/P IR infusion Thrombolysis 2-2  -Vitals stable.  -Continue with heparin Gtt.  -Pulmonary to comment on oral anticoagulation, plan for PFO,, also fiance with concern for Sleep apnea. Pulmonary to help arrange outpatient sleep study.    2-DVT right Popliteal Vein:  On heparin Gtt.   3-Diabetes; history of pre-diabetic. Hb-A 1c at 13. He will need to be discharge on insulin. Start lantus 20 units today. Diabetic education.   4-Acute hypoxic Respiratory failure; secondary to PE. Stable. See problem Number one.   Code Status: Full Code.  Family Communication: Care discussed with patient and fiance.  Disposition Plan: Home when stable. Start oral anticoagulation today.    Consultants:  CCM, Pulmonologist  Procedures:  Doppler LE; - Findings consistent with acute deep vein thrombosis involving the right popliteal vein. - No obvious evidence of deep vein thrombosis involving the left lower extremity. - No evidence of Baker&'s cyst on the right or left.  ECHO; - Normal LV size with mild LV hypertrophy. EF 60-65%. The RV was moderately dilated with moderately decreased systolic function. D-shaped septum suggestive of RV pressure/volume overload. No significant valvular abnormalities. This picture could be consistent with a hemodynamically significant pulmonary embolus. PFO  IR direct Thrombolysis.   Antibiotics:  none  HPI/Subjective: He is feeling better today. He is breathing better.  Still with productive cough.  No BM in the last few days.  Dyspnea on exertion better.  Patient was not aware  of results of Doppler.   Objective: Filed Vitals:   03/19/14 0422  BP: 100/87  Pulse: 104  Temp: 99.1 F (37.3 C)  Resp: 20    Intake/Output Summary (Last 24 hours) at 03/19/14 0819 Last data filed at 03/19/14 0423  Gross per 24 hour  Intake   2078 ml  Output    905 ml  Net   1173 ml   Filed Weights   03/16/14 1915 03/18/14 0500 03/19/14 0422  Weight: 145.151 kg (320 lb) 145.7 kg (321 lb 3.4 oz) 147.4 kg (324 lb 15.3 oz)    Exam:   General:  Alert in no distress.   Cardiovascular: S 1, S 2 RRR  Respiratory: Bilateral Crackles, no wheezing.   Abdomen: Bs present, soft, NT  Musculoskeletal: no edema  Data Reviewed: Basic Metabolic Panel:  Recent Labs Lab 03/16/14 1935 03/16/14 2350 03/17/14 0605 03/18/14 0422  NA 132* 133* 135 137  K 3.6 4.5 3.9 4.0  CL 99 100 106 104  CO2 GLUCOSE 344* 383* 308* 278*  BUN CREATININE 0.95 1.05 0.91 1.06  CALCIUM 9.1 9.3 7.8* 8.6  MG  --  1.6 1.5  --   PHOS  --  3.9 3.5  --    Liver Function Tests:  Recent Labs Lab 03/16/14 1935  AST 27  ALT 28  ALKPHOS 101  BILITOT 0.5  PROT 7.9  ALBUMIN 4.2   No results for input(s): LIPASE, AMYLASE in the last 168 hours. No results for input(s): AMMONIA in the last 168 hours. CBC:  Recent Labs Lab 03/16/14 1935  03/17/14 0854 03/17/14 1500 03/17/14 2200 03/18/14 0422 03/19/14 0500  WBC 8.3  < > 8.7 9.3 9.9 9.7 8.0  NEUTROABS 5.2  --   --   --   --   --   --   HGB 15.3  < > 13.3 13.5 12.3* 12.8* 11.9*  HCT 43.1  < > 38.1* 38.6* 35.8* 37.8* 34.8*  MCV 75.3*  < > 75.9* 76.4* 77.0* 77.8* 77.9*  PLT 243  < > 229 180 168 187 185  < > = values in this interval not displayed. Cardiac Enzymes:  Recent Labs Lab 03/16/14 1935 03/16/14 2350 03/17/14 0605 03/17/14 1500  TROPONINI 0.03 0.22* 0.35* 0.27*   BNP (last 3 results)  Recent Labs  03/16/14 2103 03/16/14 2350  BNP 31.3 14.8    ProBNP (last 3 results) No results for input(s):  PROBNP in the last 8760 hours.  CBG:  Recent Labs Lab 03/18/14 0827 03/18/14 1228 03/18/14 1529 03/18/14 2044 03/19/14 0558  GLUCAP 201* 218* 327* 250* 215*    Recent Results (from the past 240 hour(s))  MRSA PCR Screening     Status: None   Collection Time: 03/16/14 11:12 PM  Result Value Ref Range Status   MRSA by PCR NEGATIVE NEGATIVE Final    Comment:        The GeneXpert MRSA Assay (FDA approved for NASAL specimens only), is one component of a comprehensive MRSA colonization surveillance program. It is not intended to diagnose MRSA infection nor to guide or monitor treatment for MRSA infections.      Studies: Ir Rande Lawmanhromb F/u Eval Art/ven Final Day (ms)  03/18/2014   CLINICAL DATA:  39 year old with bilateral pulmonary emboli. Follow-up bilateral pulmonary artery 12 hr tPA infusion.  EXAM: FOLLOW-UP THROMBOLYTIC THERAPY WITH FLUOROSCOPY  Physician: Rachelle HoraAdam R. Henn, MD  FLUOROSCOPY TIME:  48 seconds, 29.2 mGy  MEDICATIONS: None  ANESTHESIA/SEDATION: Moderate sedation time: None  PROCEDURE: Patient was brought into the interventional suite. The wire was removed from the left pulmonary artery infusion catheter. The left pulmonary artery infusion catheter was connected to a transducer. Catheter was pulled back into the main left pulmonary artery. Catheter was further pulled back into the main pulmonary artery. Pressures were obtained from the left pulmonary artery catheter. The entire catheter was removed. The right pulmonary catheter was removed. Both right venous sheath were removed with manual compression. Manual compression was applied to the puncture sites. Patient was transferred back to the intensive care unit.  FINDINGS: Left pulmonary artery pressure: 47/33 mmHg, mean 39  Main pulmonary artery:  49/28 mmHg, mean 37  Stable position of the bilateral pulmonary artery catheters. Catheters are located in the lower lobe branches.  COMPLICATIONS: None  IMPRESSION: Completion of the  catheter-directed thrombolysis for bilateral pulmonary emboli.  Minimal decrease in the pulmonary artery pressures following the thrombolytic infusion.  Removal of the pulmonary artery catheters and right groin sheaths.   Electronically Signed   By: Richarda OverlieAdam  Henn M.D.   On: 03/18/2014 08:41    Scheduled Meds: . Influenza vac split quadrivalent PF  0.5 mL Intramuscular Tomorrow-1000  . insulin aspart  0-20 Units Subcutaneous TID WC & HS  . insulin glargine  20 Units Subcutaneous QHS  . pantoprazole (PROTONIX) IV  40 mg Intravenous Q24H  . pneumococcal 23 valent vaccine  0.5 mL Intramuscular Tomorrow-1000  . sodium chloride  3 mL Intravenous Q12H   Continuous Infusions: . sodium chloride 50 mL/hr at 03/19/14 0818  . sodium chloride    . sodium chloride    . sodium chloride    . 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     Hartley Barefoot A  [facility] Pager [phone]. If 7PM-7AM, please contact night-coverage at [email or web address], password [facility] 03/19/2014, 8:19 AM  LOS: 3 days

## 2014-03-19 NOTE — Progress Notes (Addendum)
Inpatient Diabetes Program Recommendations  AACE/ADA: New Consensus Statement on Inpatient Glycemic Control (2013)  Target Ranges:  Prepandial:   less than 140 mg/dL      Peak postprandial:   less than 180 mg/dL (1-2 hours)      Critically ill patients:  140 - 180 mg/dL   Reason for Visit: Results for Jordan Cline, Jordan Cline (MRN [id number]) as of 03/19/2014 10:00  Ref. Range 03/18/2014 08:27 03/18/2014 12:28 03/18/2014 15:29 03/18/2014 20:44 03/19/2014 05:58  Glucose-Capillary Latest Range: 70-99 mg/dL 201 (H) 218 (H) 327 (H) 250 (H) 215 (H)  Results for Jordan Cline, Jordan Cline (MRN [id number]) as of 03/19/2014 10:00  Ref. Range 03/18/2014 10:00  Hgb A1c MFr Bld Latest Range: 4.8-5.6 % 13.0 (H)   Note elevated CBG's and A1C.  Agree with start of basal insulin.  Ordered survival skill teaching by bedside RN including insulin administration.  Ordered insulin starter kit (pen needles) since patient does have insurance.  This may be more convenient and encourage adherence.  Also ordered diabetes videos for patient to watch while in the hospital.  Will discuss with patient.    Thanks, Adah Perl, RN, BC-ADM Inpatient Diabetes Coordinator Pager [phone]     Addendum:  Spoke with patient and family at length regarding new diagnosis of diabetes.  Briefly discussed lifestyle changes and diet including need to eliminate sugar from beverages, plate method, and exercise.  He seems very motivated to make changes.  Discussed use of insulin and importance of follow-up with PCP.  He is also interested in seeing Endocrinologist.  Domingo Mend' very supportive and states "we are going to change our lifestyle".  He is interested in seeing dietician/CDE after discharge.  States that father has diabetes and uses insulin pen.  He is interested in learning to use insulin pen for convenience.  Discussed A1C results and average CBG's.  Also discussed importance of controlling BP and cholesterol.  He is a member of a gym.  Told him to discuss  exercise with MD and when he can potentially resume?  Discussed plan of care with RN.   Thanks, Adah Perl, RN, MSN, BC-ADM

## 2014-03-19 NOTE — Progress Notes (Signed)
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 Last few weeks has been having dyspnea, cough and passed it off as bronchitis. Some worse this past week along with gerd and atypical chest pain. On 03/16/14 upon entering car after leaving office building became very dyspneic, light headed and felt like passing out. Went to [facility] - and submassive PE with RV strain on CT angio confirmed (duplex LE data not available). Initial troponin normal. Started on IV heparin and transffered to [facility]. Prior to transfer decompensated from [place] o2 to 100% face mask (? 15L via NRB). Dyspneic and anxious upon arrival in 2SICU at [facility]. PCCM admitting 03/16/2014 . Denies prololnged travel  SUBJECTIVE:  80% improved Feels less SOB, but still requiring [place] O2.  Denies CP.   VITAL SIGNS: Temp:  [98 F (36.7 C)-99.1 F (37.3 C)] 99.1 F (37.3 C) (02/04 0422) Pulse Rate:  [104-116] 104 (02/04 0422) Resp:  [17-29] 20 (02/04 0422) BP: (100-136)/(66-87) 100/87 mmHg (02/04 0422) SpO2:  [90 %-100 %] 94 % (02/04 0422) Weight:  [147.4 kg (324 lb 15.3 oz)] 147.4 kg (324 lb 15.3 oz) (02/04 0422) HEMODYNAMICS:   VENTILATOR SETTINGS:   INTAKE / OUTPUT:  Intake/Output Summary (Last 24 hours) at 03/19/14 1035 Last data filed at 03/19/14 0800  Gross per 24 hour  Intake   2295 ml  Output    755 ml  Net   1540 ml    PHYSICAL EXAMINATION: General:  Obese male. Comfortable  Neuro:  Alert and oriented x 3. Speech normal. Non focal HEENT:  Neck supple. No  elevated JVP.  Cardiovascular: Tachy wit reg rhythm  Lungs:  CTA bilateraly. Speaks full sentences. No acc muscle use. On [place] o2 Abdomen:  Obese. Soft. No mass Musculoskeletal:  No cyanosis. No clubbing. No edema.  Skin:  Intact but dry  LABS:  CBC  Recent Labs Lab 03/17/14 2200 03/18/14 0422 03/19/14 0500  WBC 9.9 9.7 8.0  HGB 12.3* 12.8* 11.9*  HCT 35.8* 37.8* 34.8*  PLT 168 187 185   Coag's  Recent Labs Lab 03/16/14 2350  INR 1.15   BMET  Recent Labs Lab 03/16/14 2350 03/17/14 0605 03/18/14 0422  NA 133* 135 137  K 4.5 3.9 4.0  CL 100 106 104  CO2 20 21 24   BUN 8 6 6   CREATININE 1.05 0.91 1.06  GLUCOSE 383* 308* 278*   Electrolytes  Recent Labs Lab 03/16/14 2350 03/17/14 0605 03/18/14 0422  CALCIUM 9.3 7.8* 8.6  MG 1.6 1.5  --   PHOS 3.9 3.5  --    Sepsis Markers  Recent Labs Lab 03/16/14 2350 03/17/14 0605  LATICACIDVEN 2.4* 1.6   ABG  Recent Labs Lab 03/16/14 2153  PHART 7.407  PCO2ART 31.8*  PO2ART 50.0*   Liver Enzymes  Recent Labs Lab 03/16/14 1935  AST 27  ALT 28  ALKPHOS 101  BILITOT 0.5  ALBUMIN 4.2   Cardiac Enzymes  Recent Labs Lab 03/16/14 2350 03/17/14 0605 03/17/14 1500  TROPONINI 0.22* 0.35* 0.27*   Glucose  Recent Labs Lab 03/17/14 2202 03/18/14 0827 03/18/14 1228 03/18/14 1529 03/18/14 2044 03/19/14 0558  GLUCAP 236* 201* 218* 327* 250* 215*    Imaging No results found. CT angio 2/116 - submassive central PE with RV strain  ASSESSMENT / PLAN:  PULMONARY  A:  - Submassive PE - central saddle PE with distribution to bilateral UL and LL -  RV strain on ECHO but initial troponin and bnp normal, RLE DVT, Post treatment PA pressure 49/28, mean 37 in main pulmonary artery.  - Severity: Class 4 of 5 PESI score  - HIGH SCORE  - Acute hypoxemic respiratory failure  -resolved  - Risk factor: obese, smoker, sedentary - sits > 8h/day, grandmother with PE (possibly)  P:   Change to lovenox ; S/p  Catheter directed lytics. Consider coumadin vs NoAc in 24h  Follow PAP's post-lysis Continue [place] O2 to maintain sats > 95% Sleep study as oupt   CARDIOVASCULAR A: at risk for hemodynamic collapse, no evidence thus far Bubble study difficult, possibly positive,suggesting PFO. P:  monitor closely May need cards input at some point    ENDOCRINE A:  DM P:   Check a1c ssi  D/w pt & fiance Fransisco Beau, MD 03/19/2014, 10:35 AM

## 2014-03-19 NOTE — Consult Note (Addendum)
Reason for Consult: Possible PFO Referring Physician: Johnney Cline Cline is an 39 y.o. male.  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  He presented with two weeks of dyspnea and was found to have a saddle PE.  He underwent TPA infusion through bilateral pulmonary artery infusion catheters on 03/17/14. He is now on Xarelto.  Echocardiogram revealed EF 60-65%, mild LVH, G1DD, D-shaped interventricular septum RV moderately dilated with moderately reduced function, bubble study "diffucult" to assess.  "Possibly positive for PFO".   He reports improvement in breathing.  Cough and some nausea last night.  He currently denies vomiting, fever, chest pain, orthopnea, dizziness, PND, abdominal pain, hematochezia, melena, lower extremity edema, claudication.   Past Medical History  Diagnosis Date  . Borderline diabetic   . Borderline hypertension     History reviewed. No pertinent past surgical history.  History reviewed. No pertinent family history.  Social History:  reports that he has been smoking Cigarettes.  He does not have any smokeless tobacco history on file. He reports that he drinks alcohol. His drug history is not on file.  Allergies: No Known Allergies  Medications:  Scheduled Meds: . Influenza vac split quadrivalent PF  0.5 mL Intramuscular Tomorrow-1000  . insulin aspart  0-20 Units Subcutaneous TID WC & HS  . insulin glargine  20 Units Subcutaneous QHS  . [START ON 03/20/2014] pantoprazole  40 mg Oral Daily  . pneumococcal 23 valent vaccine  0.5 mL Intramuscular Tomorrow-1000  . polyethylene glycol  17 g Oral BID  . rivaroxaban  15 mg Oral BID WC   Followed by  . [START ON 04/09/2014] rivaroxaban  20 mg Oral Q supper  . senna  1 tablet Oral Daily  . sodium chloride  3 mL Intravenous Q12H   Continuous Infusions: . sodium chloride    . sodium chloride    . sodium chloride     PRN  Meds:.sodium chloride, sodium chloride   Results for orders placed or performed during the hospital encounter of 03/16/14 (from the past 48 hour(s))  Troponin I     Status: Abnormal   Collection Time: 03/17/14  3:00 PM  Result Value Ref Range   Troponin I 0.27 (H) <0.031 ng/mL    Comment:        PERSISTENTLY INCREASED TROPONIN VALUES IN THE RANGE OF 0.04-0.49 ng/mL CAN BE SEEN IN:       -UNSTABLE ANGINA       -CONGESTIVE HEART FAILURE       -MYOCARDITIS       -CHEST TRAUMA       -ARRYHTHMIAS       -LATE PRESENTING MYOCARDIAL INFARCTION       -COPD   CLINICAL FOLLOW-UP RECOMMENDED.   CBC every 6 hours x 4 post-procedure     Status: Abnormal   Collection Time: 03/17/14  3:00 PM  Result Value Ref Range   WBC 9.3 4.0 - 10.5 K/uL   RBC 5.05 4.22 - 5.81 MIL/uL   Hemoglobin 13.5 13.0 - 17.0 g/dL   HCT 38.6 (L) 39.0 - 52.0 %   MCV 76.4 (L) 78.0 - 100.0 fL   MCH 26.7 26.0 - 34.0 pg   MCHC 35.0 30.0 - 36.0 g/dL   RDW 13.9 11.5 - 15.5 %   Platelets 180 150 - 400 K/uL  Fibrinogen every 6 hours x 4  post-procedure     Status: Abnormal   Collection Time: 03/17/14  3:00 PM  Result Value Ref Range   Fibrinogen 539 (H) 204 - 475 mg/dL  Glucose, capillary     Status: Abnormal   Collection Time: 03/17/14  3:22 PM  Result Value Ref Range   Glucose-Capillary 264 (H) 70 - 99 mg/dL  Heparin level (unfractionated)     Status: Abnormal   Collection Time: 03/17/14  7:50 PM  Result Value Ref Range   Heparin Unfractionated 0.27 (L) 0.30 - 0.70 IU/mL    Comment:        IF HEPARIN RESULTS ARE BELOW EXPECTED VALUES, AND PATIENT DOSAGE HAS BEEN CONFIRMED, SUGGEST FOLLOW UP TESTING OF ANTITHROMBIN III LEVELS.   CBC every 6 hours x 4 post-procedure     Status: Abnormal   Collection Time: 03/17/14 10:00 PM  Result Value Ref Range   WBC 9.9 4.0 - 10.5 K/uL   RBC 4.65 4.22 - 5.81 MIL/uL   Hemoglobin 12.3 (L) 13.0 - 17.0 g/dL   HCT 35.8 (L) 39.0 - 52.0 %   MCV 77.0 (L) 78.0 - 100.0 fL   MCH 26.5  26.0 - 34.0 pg   MCHC 34.4 30.0 - 36.0 g/dL   RDW 14.0 11.5 - 15.5 %   Platelets 168 150 - 400 K/uL  Fibrinogen every 6 hours x 4 post-procedure     Status: None   Collection Time: 03/17/14 10:00 PM  Result Value Ref Range   Fibrinogen 471 204 - 475 mg/dL  Heparin level (unfractionated)     Status: Abnormal   Collection Time: 03/17/14 10:00 PM  Result Value Ref Range   Heparin Unfractionated 0.21 (L) 0.30 - 0.70 IU/mL    Comment:        IF HEPARIN RESULTS ARE BELOW EXPECTED VALUES, AND PATIENT DOSAGE HAS BEEN CONFIRMED, SUGGEST FOLLOW UP TESTING OF ANTITHROMBIN III LEVELS.   Glucose, capillary     Status: Abnormal   Collection Time: 03/17/14 10:02 PM  Result Value Ref Range   Glucose-Capillary 236 (H) 70 - 99 mg/dL  CBC     Status: Abnormal   Collection Time: 03/18/14  4:22 AM  Result Value Ref Range   WBC 9.7 4.0 - 10.5 K/uL   RBC 4.86 4.22 - 5.81 MIL/uL   Hemoglobin 12.8 (L) 13.0 - 17.0 g/dL   HCT 37.8 (L) 39.0 - 52.0 %   MCV 77.8 (L) 78.0 - 100.0 fL   MCH 26.3 26.0 - 34.0 pg   MCHC 33.9 30.0 - 36.0 g/dL   RDW 14.2 11.5 - 15.5 %   Platelets 187 150 - 400 K/uL  Basic metabolic panel     Status: Abnormal   Collection Time: 03/18/14  4:22 AM  Result Value Ref Range   Sodium 137 135 - 145 mmol/L   Potassium 4.0 3.5 - 5.1 mmol/L   Chloride 104 96 - 112 mmol/L   CO2 24 19 - 32 mmol/L   Glucose, Bld 278 (H) 70 - 99 mg/dL   BUN 6 6 - 23 mg/dL   Creatinine, Ser 1.06 0.50 - 1.35 mg/dL   Calcium 8.6 8.4 - 10.5 mg/dL   GFR calc non Af Amer 88 (L) >90 mL/min   GFR calc Af Amer >90 >90 mL/min    Comment: (NOTE) The eGFR has been calculated using the CKD EPI equation. This calculation has not been validated in all clinical situations. eGFR's persistently <90 mL/min signify possible Chronic Kidney Disease.  Anion gap 9 5 - 15  Heparin level (unfractionated)     Status: None   Collection Time: 03/18/14  4:26 AM  Result Value Ref Range   Heparin Unfractionated 0.31 0.30 -  0.70 IU/mL    Comment:        IF HEPARIN RESULTS ARE BELOW EXPECTED VALUES, AND PATIENT DOSAGE HAS BEEN CONFIRMED, SUGGEST FOLLOW UP TESTING OF ANTITHROMBIN III LEVELS.   Glucose, capillary     Status: Abnormal   Collection Time: 03/18/14  8:27 AM  Result Value Ref Range   Glucose-Capillary 201 (H) 70 - 99 mg/dL  Hemoglobin A1c     Status: Abnormal   Collection Time: 03/18/14 10:00 AM  Result Value Ref Range   Hgb A1c MFr Bld 13.0 (H) 4.8 - 5.6 %    Comment: (NOTE)         Pre-diabetes: 5.7 - 6.4         Diabetes: >6.4         Glycemic control for adults with diabetes: <7.0    Mean Plasma Glucose 326 mg/dL    Comment: (NOTE) Performed At: [facility] [address] [place], [place] [id number] Lindon Romp MD XA:[id number]   Glucose, capillary     Status: Abnormal   Collection Time: 03/18/14 12:28 PM  Result Value Ref Range   Glucose-Capillary 218 (H) 70 - 99 mg/dL  Heparin level (unfractionated)     Status: None   Collection Time: 03/18/14  2:29 PM  Result Value Ref Range   Heparin Unfractionated 0.53 0.30 - 0.70 IU/mL    Comment:        IF HEPARIN RESULTS ARE BELOW EXPECTED VALUES, AND PATIENT DOSAGE HAS BEEN CONFIRMED, SUGGEST FOLLOW UP TESTING OF ANTITHROMBIN III LEVELS.   Glucose, capillary     Status: Abnormal   Collection Time: 03/18/14  3:29 PM  Result Value Ref Range   Glucose-Capillary 327 (H) 70 - 99 mg/dL  Glucose, capillary     Status: Abnormal   Collection Time: 03/18/14  8:44 PM  Result Value Ref Range   Glucose-Capillary 250 (H) 70 - 99 mg/dL  CBC     Status: Abnormal   Collection Time: 03/19/14  5:00 AM  Result Value Ref Range   WBC 8.0 4.0 - 10.5 K/uL   RBC 4.47 4.22 - 5.81 MIL/uL   Hemoglobin 11.9 (L) 13.0 - 17.0 g/dL   HCT 34.8 (L) 39.0 - 52.0 %   MCV 77.9 (L) 78.0 - 100.0 fL   MCH 26.6 26.0 - 34.0 pg   MCHC 34.2 30.0 - 36.0 g/dL   RDW 14.2 11.5 - 15.5 %   Platelets 185 150 - 400 K/uL  Heparin level (unfractionated)      Status: None   Collection Time: 03/19/14  5:00 AM  Result Value Ref Range   Heparin Unfractionated 0.45 0.30 - 0.70 IU/mL    Comment:        IF HEPARIN RESULTS ARE BELOW EXPECTED VALUES, AND PATIENT DOSAGE HAS BEEN CONFIRMED, SUGGEST FOLLOW UP TESTING OF ANTITHROMBIN III LEVELS.   Glucose, capillary     Status: Abnormal   Collection Time: 03/19/14  5:58 AM  Result Value Ref Range   Glucose-Capillary 215 (H) 70 - 99 mg/dL  Glucose, capillary     Status: Abnormal   Collection Time: 03/19/14 11:15 AM  Result Value Ref Range   Glucose-Capillary 254 (H) 70 - 99 mg/dL    Ir Jacolyn Reedy F/u Eval Art/ven Final Day (ms)  03/18/2014   CLINICAL DATA:  39 year old with bilateral pulmonary emboli. Follow-up bilateral pulmonary artery 12 hr tPA infusion.  EXAM: FOLLOW-UP THROMBOLYTIC THERAPY WITH FLUOROSCOPY  Physician: Stephan Minister. Henn, MD  FLUOROSCOPY TIME:  48 seconds, 29.2 mGy  MEDICATIONS: None  ANESTHESIA/SEDATION: Moderate sedation time: None  PROCEDURE: Patient was brought into the interventional suite. The wire was removed from the left pulmonary artery infusion catheter. The left pulmonary artery infusion catheter was connected to a transducer. Catheter was pulled back into the main left pulmonary artery. Catheter was further pulled back into the main pulmonary artery. Pressures were obtained from the left pulmonary artery catheter. The entire catheter was removed. The right pulmonary catheter was removed. Both right venous sheath were removed with manual compression. Manual compression was applied to the puncture sites. Patient was transferred back to the intensive care unit.  FINDINGS: Left pulmonary artery pressure: 47/33 mmHg, mean 57  Main pulmonary artery:  49/28 mmHg, mean 37  Stable position of the bilateral pulmonary artery catheters. Catheters are located in the lower lobe branches.  COMPLICATIONS: None  IMPRESSION: Completion of the catheter-directed thrombolysis for bilateral pulmonary emboli.   Minimal decrease in the pulmonary artery pressures following the thrombolytic infusion.  Removal of the pulmonary artery catheters and right groin sheaths.   Electronically Signed   By: Markus Daft M.D.   On: 03/18/2014 08:41    Review of Systems  Constitutional: Negative for fever.  HENT: Positive for congestion.   Respiratory: Positive for cough and shortness of breath (Improved).   Cardiovascular: Negative for chest pain, orthopnea, claudication and leg swelling.  Gastrointestinal: Positive for nausea (Last night). Negative for vomiting, abdominal pain, blood in stool and melena.  Musculoskeletal: Negative for myalgias.  Neurological: Negative for dizziness.   Blood pressure 103/57, pulse 109, temperature 99.3 F (37.4 C), temperature source Oral, resp. rate 19, height 6' (1.829 m), weight 324 lb 15.3 oz (147.4 kg), SpO2 92 %. Physical Exam  Nursing note and vitals reviewed. Constitutional: He is oriented to person, place, and time. He appears well-developed. No distress.  Obese  HENT:  Head: Normocephalic and atraumatic.  Mouth/Throat: No oropharyngeal exudate.  Eyes: EOM are normal. Pupils are equal, round, and reactive to light. No scleral icterus.  Neck: Normal range of motion. Neck supple.  Cardiovascular: Regular rhythm, S1 normal and S2 normal.  Tachycardia present.   No murmur heard. Pulses:      Radial pulses are 2+ on the right side, and 2+ on the left side.       Dorsalis pedis pulses are 2+ on the right side, and 2+ on the left side.  Respiratory: Effort normal and breath sounds normal. He has no wheezes. He has no rales.  GI: Soft. Bowel sounds are normal. He exhibits no distension. There is no tenderness.  Musculoskeletal: He exhibits edema (Trace LEE).  Neurological: He is alert and oriented to person, place, and time. He exhibits normal muscle tone.  Skin: Skin is warm and dry.  Psychiatric: He has a normal mood and affect.    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 OSA  Plan: Cardiologist to review echo.  Recommend repeat contrast echo in three months.  Cardiologist's opinion to follow.  OP sleep study recommended by Dr. Tyrell Antonio.     Jordan Fuller, PA-C 03/19/2014, 12:52 PM    I have seen, examined and evaluated the patient this PM along with Mr. Lucia Gaskins.  After reviewing all the available data and chart,  I agree with his findings, examination as well as impression recommendations.  Very pleasant young man with a prior h/o of Murmur s/p directed thrombolytics for Saddle PE.  2D Echo with notable R-L bowing of Intra-Atrial Septum ("intra-septal aneurysm") with + Bubble study suggesting PFO. Also noted RV pressure/volume overload c/w PE - this indicates increased RV & RA pressures,hence R-L bowing of IAS.  He is now on Xarelto for anticoagulation - would recommend at least 70month.   Consider IVC filter given size of PE & existing RLE DVT -- with IAS bowing & PFO - paradoxical emobolism risk is theoretically higher.  I will review the Echo results with Dr. CBurt Knack(who does PFO-ASD closure percutaneously) to for additional recommendations.    [facility]  Consider IVC filter  Recheck Echo with bubble in ~3 months to reassess RV/RA size & pressures as well as patency of PFO.  Will arrange for him to [facility] to discuss management options of PFO  Agree with OP OSA evaluation.  HLeonie Man M.D., M.S. Interventional Cardiologist   Pager # [phone]

## 2014-03-20 ENCOUNTER — Encounter (HOSPITAL_COMMUNITY): Payer: Self-pay | Admitting: Physician Assistant

## 2014-03-20 ENCOUNTER — Inpatient Hospital Stay (HOSPITAL_COMMUNITY): Payer: Managed Care, Other (non HMO)

## 2014-03-20 DIAGNOSIS — I82431 Acute embolism and thrombosis of right popliteal vein: Secondary | ICD-10-CM

## 2014-03-20 LAB — BASIC METABOLIC PANEL
Anion gap: 7 (ref 5–15)
BUN: 7 mg/dL (ref 6–23)
CALCIUM: 8.8 mg/dL (ref 8.4–10.5)
CO2: 26 mmol/L (ref 19–32)
Chloride: 104 mmol/L (ref 96–112)
Creatinine, Ser: 0.89 mg/dL (ref 0.50–1.35)
GFR calc Af Amer: >90 mL/min (ref >90)
GFR calc non Af Amer: >90 mL/min (ref >90)
Glucose, Bld: 199 mg/dL — ABNORMAL HIGH (ref 70–99)
Potassium: 3.5 mmol/L (ref 3.5–5.1)
SODIUM: 137 mmol/L (ref 135–145)

## 2014-03-20 LAB — CBC
HCT: 36.8 % — ABNORMAL LOW (ref 39.0–52.0)
Hemoglobin: 12.3 g/dL — ABNORMAL LOW (ref 13.0–17.0)
MCH: 26.4 pg (ref 26.0–34.0)
MCHC: 33.4 g/dL (ref 30.0–36.0)
MCV: 79 fL (ref 78.0–100.0)
PLATELETS: 215 10*3/uL (ref 150–400)
RBC: 4.66 MIL/uL (ref 4.22–5.81)
RDW: 14.2 % (ref 11.5–15.5)
WBC: 6.3 10*3/uL (ref 4.0–10.5)

## 2014-03-20 LAB — GLUCOSE, CAPILLARY
GLUCOSE-CAPILLARY: 220 mg/dL — AB (ref 70–99)
GLUCOSE-CAPILLARY: 202 mg/dL — AB (ref 70–99)
Glucose-Capillary: 179 mg/dL — ABNORMAL HIGH (ref 70–99)
Glucose-Capillary: 155 mg/dL — ABNORMAL HIGH (ref 70–99)

## 2014-03-20 LAB — MAGNESIUM: Magnesium: 1.7 mg/dL (ref 1.5–2.5)

## 2014-03-20 MED ORDER — GUAIFENESIN ER 600 MG PO TB12
600.0000 mg | ORAL_TABLET | Freq: Two times a day (BID) | ORAL | Status: DC
  Administered 2014-03-20: 600 mg via ORAL
  Filled 2014-03-20 (×4): qty 1

## 2014-03-20 NOTE — Care Management Note (Signed)
    Page 1 of 1   03/20/2014     1:51:21 PM CARE MANAGEMENT NOTE 03/20/2014  Patient:  Jordan Cline,Jordan Cline   Account Number:  [id number]  Date Initiated:  03/17/2014  Documentation initiated by:  [facility] Center[facility],SARAH  Subjective/Objective Assessment:   Admitted with saddle block PE - IR placed cath for thrombolytic therapy.     Action/Plan:   Anticipated DC Date:  03/20/2014   Anticipated DC Plan:  HOME/SELF CARE      DC Planning Services  CM consult      Choice offered to / List presented to:             Status of service:  Completed, signed off Medicare Important Message given?   (If response is "NO", the following Medicare IM given date fields will be blank) Date Medicare IM given:   Medicare IM given by:   Date Additional Medicare IM given:   Additional Medicare IM given by:    Discharge Disposition:  HOME/SELF CARE  Per UR Regulation:  Reviewed for med. necessity/level of care/duration of stay  If discussed at Long Length of Stay Meetings, dates discussed:    Comments:  Contact: Delpino,Karen Mother [phone]  [phone]  03/20/14- 1300- Donn PieriniKristi Zariel Capano RN, BSN [phone] Pt given both 30 day free card and copay savings card for Xarelto- copay cost shared with pt for $4.33 (per pt he had called and was given a cost of $75) unsure why the cost difference between the checks- pt should be able to use the copay savings card.  03-18-14 3:44pm Avie ArenasSarah Brown, [phone]- [phone] Benefits result for anticoags. PT COPAY FOR ELIQUIS  WILL BE $2.38. XARELTO $4.33- PRIOR AUTH IS NOT REQUIRED  HERESA C. @ [employer] # [phone]     ** CONTACT FOR D/C PLANNING *

## 2014-03-20 NOTE — Progress Notes (Signed)
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 Herbie Baltimore (new)  Patient Profile: 39 yo male w/ hx obesity, HTN, tob use (quit 4 weeks), SEM, was admitted 02/01 w/ PE, saddle embolus rx w/ TPA, cards saw 02/04 for poss PFO on echo w/ bubble study.  SUBJECTIVE: Breathing better, still DOE, doesn't have primary MD, CM is helping him. Years ago told he was pre-diabetic, father has DM, willing to make dietary changes.  OBJECTIVE Filed Vitals:   03/19/14 2000 03/20/14 0507 03/20/14 0700 03/20/14 1322  BP: 126/76 124/74 100/74 108/65  Pulse: 103 93 99 97  Temp: 99.6 F (37.6 C) 98.6 F (37 C) 97.9 F (36.6 C) 98.1 F (36.7 C)  TempSrc: Oral Oral Oral Oral  Resp: Height:      Weight:  324 lb 11.8 oz (147.3 kg)    SpO2: 92% 95% 95% 93%    Intake/Output Summary (Last 24 hours) at 03/20/14 1339 Last data filed at 03/20/14 1321  Gross per 24 hour  Intake 1843.33 ml  Output    750 ml  Net 1093.33 ml   Filed Weights   03/18/14 0500 03/19/14 0422 03/20/14 0507  Weight: 321 lb 3.4 oz (145.7 kg) 324 lb 15.3 oz (147.4 kg) 324 lb 11.8 oz (147.3 kg)    PHYSICAL EXAM General: Well developed, well nourished, male in no acute distress. Head: Normocephalic, atraumatic.  Neck: Supple without bruits, JVD minimal elevation. Lungs:  Resp regular and unlabored, slightly decreased BS bases. Heart: RRR, S1, S2, no S3, S4, 2/6 murmur; no rub. Abdomen: Soft, non-tender, non-distended, BS + x 4.  Extremities: No clubbing, cyanosis, no edema.  Neuro: Alert and oriented X 3. Moves all extremities spontaneously. Psych: Normal affect.  LABS: CBC:  Recent Labs  03/19/14 0500 03/20/14 0534  WBC 8.0  6.3  HGB 11.9* 12.3*  HCT 34.8* 36.8*  MCV 77.9* 79.0  PLT 185 215   Basic Metabolic Panel:  Recent Labs  16/10/96 0422 03/20/14 0534  NA 137 137  K 4.0 3.5  CL 104 104  CO2 24 26  GLUCOSE 278* 199*  BUN 6 7  CREATININE 1.06 0.89  CALCIUM 8.6 8.8  MG  --  1.7   Cardiac Enzymes:  Recent Labs  03/17/14 1500  TROPONINI 0.27*   Hemoglobin A1C:  Recent Labs  03/18/14 1000  HGBA1C 13.0*   TELE:  SR, ST  Radiology/Studies: Dg Chest 2 View  03/20/2014   CLINICAL DATA:  Productive cough, left chest tightness  EXAM: CHEST  2 VIEW  COMPARISON:  03/16/2014  FINDINGS: Cardiomediastinal silhouette is stable. Central mild vascular congestion without convincing pulmonary edema. There is elevation of the right hemidiaphragm again noted. There is hazy atelectasis or infiltrate in lingula and left base.  IMPRESSION: No convincing pulmonary edema. Hazy atelectasis or infiltrate in lingula and left base. Chronic elevation of the right hemidiaphragm.   Electronically Signed   By: Natasha Mead M.D.   On: 03/20/2014 09:37     Current Medications:  . guaiFENesin  600 mg Oral BID  . Influenza vac split quadrivalent PF  0.5 mL Intramuscular Tomorrow-1000  . insulin aspart  0-20 Units Subcutaneous  TID WC & HS  . insulin glargine  20 Units Subcutaneous QHS  . pantoprazole  40 mg Oral Daily  . pneumococcal 23 valent vaccine  0.5 mL Intramuscular Tomorrow-1000  . polyethylene glycol  17 g Oral BID  . rivaroxaban  15 mg Oral BID WC   Followed by  . [START ON 04/09/2014] rivaroxaban  20 mg Oral Q supper  . senna  1 tablet Oral Daily  . sodium chloride  3 mL Intravenous Q12H   . sodium chloride    . sodium chloride    . 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 Then decide on referral to Dr. Excell Seltzerooper for PFO closure.  Signed, Birdena Crandallhonda G. Barret , PA-C 1:39 PM 03/20/2014  Patient seen and examined. Agree with assessment and plan. Submassive PE/central saddle with dilated RV with RV strain, RA and increased pressure with positive bubble study for PFO in the setting of increased RA pressure contributing to IAS bowing into LA. S/P TPA for thrombolysis. Now on xarelto. Breathing improved. In future will need future echo/TEE to re-assess PE once right sided pressures have improved.  Highly suspicious for obstructive sleep apnea. Will need sleep study.  Lennette Biharihomas A. Kelly, MD, [facility] AuthorityFACC 03/20/2014 1:39 PM

## 2014-03-20 NOTE — Progress Notes (Addendum)
Inpatient Diabetes Program Recommendations  AACE/ADA: New Consensus Statement on Inpatient Glycemic Control (2013)  Target Ranges:  Prepandial:   less than 140 mg/dL      Peak postprandial:   less than 180 mg/dL (1-2 hours)      Critically ill patients:  140 - 180 mg/dL     Results for DONATO, STUDLEY (MRN [id number]) as of 03/20/2014 11:13  Ref. Range 03/19/2014 05:58 03/19/2014 11:15 03/19/2014 16:21 03/19/2014 21:37  Glucose-Capillary Latest Range: 70-99 mg/dL 215 (H) 254 (H) 184 (H) 219 (H)    Results for DARAN, FAVARO (MRN [id number]) as of 03/20/2014 11:13  Ref. Range 03/20/2014 05:52  Glucose-Capillary Latest Range: 70-99 mg/dL 179 (H)     **Note DM Coordinator met with patient yesterday to discuss new diagnosis of DM.  **RNs should be providing ongoing DM survival skills education to patient.  **Educated patient on insulin pen use at home.  Reviewed all steps if insulin pen including attachment of needle, 2-unit air shot, dialing up dose, giving injection, removing needle, disposal of sharps, storage of unused insulin, disposal of insulin etc.  Patient able to provide successful return demonstration.  Also reviewed troubleshooting with insulin pen.  MD to give patient Rxs for insulin pens and insulin pen needles at time of d/c.    MD- Please make sure to give patient Rx for Insulin pens, Insulin pen needles, and CBG meter/supplies at time of d/c. Can use the following Order Numbers as needed: 1. Lantus Solostar [Order # [id number] 2. Novolog Flex pen [Order # [id number] 3. Insulin pen needles- 31 g X 47m [Order # [id number]]4. CBG meter/supplies [Order # [id number]    Will follow JWyn QuakerRN, MSN, CDE Diabetes Coordinator Inpatient Diabetes Program Team Pager: [phone](8a-10p)

## 2014-03-20 NOTE — Progress Notes (Signed)
Pt oxygen saturation on room air was 95-96% while ambulating.  Pt had no c/o shortness of breath.

## 2014-03-20 NOTE — Progress Notes (Signed)
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 Last few weeks has been having dyspnea, cough and passed it off as bronchitis. Some worse this past week along with gerd and atypical chest pain. On 03/16/14 upon entering car after leaving office building became very dyspneic, light headed and felt like passing out. Went to [facility] - and submassive PE with RV strain on CT angio confirmed (duplex LE data not available). Initial troponin normal. Started on IV heparin and transffered to [facility]. Prior to transfer decompensated from [place] o2 to 100% face mask (? 15L via NRB). Dyspneic and anxious upon arrival in 2SICU at [facility]. PCCM admitting 03/16/2014 . Denies prololnged travel  SUBJECTIVE:  Off o2 Feels less SOB, mild chest tightness  Denies CP.   VITAL SIGNS: Temp:  [97.9 F (36.6 C)-99.6 F (37.6 C)] 97.9 F (36.6 C) (02/05 0700) Pulse Rate:  [93-109] 99 (02/05 0700) Resp:  [18-20] 18 (02/05 0700) BP: (100-126)/(57-76) 100/74 mmHg (02/05 0700) SpO2:  [92 %-95 %] 95 % (02/05 0700) Weight:  [147.3 kg (324 lb 11.8 oz)] 147.3 kg (324 lb 11.8 oz) (02/05 0507) HEMODYNAMICS:   VENTILATOR SETTINGS:   INTAKE / OUTPUT:  Intake/Output Summary (Last 24 hours) at 03/20/14 1038 Last data filed at 03/20/14 0830  Gross per 24 hour  Intake 1383.33 ml  Output   1150 ml  Net 233.33 ml    PHYSICAL EXAMINATION: General:  Obese male. Comfortable  Neuro:  Alert and oriented x 3. Speech normal. Non focal HEENT:  Neck supple. No elevated JVP.   Cardiovascular: eg rhythm  Lungs:  CTA bilateraly. Speaks full sentences. No acc muscle use Abdomen:  Obese. Soft. No mass Musculoskeletal:  No cyanosis. No clubbing. No edema.  Skin:  Intact but dry  LABS:  CBC  Recent Labs Lab 03/18/14 0422 03/19/14 0500 03/20/14 0534  WBC 9.7 8.0 6.3  HGB 12.8* 11.9* 12.3*  HCT 37.8* 34.8* 36.8*  PLT 187 185 215   Coag's  Recent Labs Lab 03/16/14 2350  INR 1.15   BMET  Recent Labs Lab 03/17/14 0605 03/18/14 0422 03/20/14 0534  NA 135 137 137  K 3.9 4.0 3.5  CL 106 104 104  CO2 21 24 26   BUN 6 6 7   CREATININE 0.91 1.06 0.89  GLUCOSE 308* 278* 199*   Electrolytes  Recent Labs Lab 03/16/14 2350 03/17/14 0605 03/18/14 0422 03/20/14 0534  CALCIUM 9.3 7.8* 8.6 8.8  MG 1.6 1.5  --  1.7  PHOS 3.9 3.5  --   --    Sepsis Markers  Recent Labs Lab 03/16/14 2350 03/17/14 0605  LATICACIDVEN 2.4* 1.6   ABG  Recent Labs Lab 03/16/14 2153  PHART 7.407  PCO2ART 31.8*  PO2ART 50.0*   Liver Enzymes  Recent Labs Lab 03/16/14 1935  AST 27  ALT 28  ALKPHOS 101  BILITOT 0.5  ALBUMIN 4.2   Cardiac Enzymes  Recent Labs Lab 03/16/14 2350 03/17/14 0605 03/17/14 1500  TROPONINI 0.22* 0.35* 0.27*  Glucose  Recent Labs Lab 03/18/14 2044 03/19/14 0558 03/19/14 1115 03/19/14 1621 03/19/14 2137 03/20/14 0552  GLUCAP 250* 215* 254* 184* 219* 179*    Imaging No results found. CT angio 2/116 - submassive central PE with RV strain  ASSESSMENT / PLAN:  PULMONARY  A:  - Submassive PE - central saddle PE with distribution to bilateral UL and LL -  RV strain on ECHO but initial troponin and bnp normal, RLE DVT, Post treatment PA pressure 49/28, mean 37 in main pulmonary artery.   - Acute hypoxemic respiratory failure  -resolved  - Risk factor: obese, smoker, sedentary - sits > 8h/day, grandmother with PE (possibly)  P:   Change to rivaroxaban - he will check with insurance today ; S/p Catheter directed  lytics. Sleep study as oupt- will be arranged on FU   CARDIOVASCULAR A: at risk for hemodynamic collapse, no evidence thus far Bubble study difficult, possibly positive,suggesting PFO. P:  monitor closely cards input noted - low clot burden in legs , no need for IVC filter at this point   ENDOCRINE A:  DM P:   Check a1c ssi  D/w pt & fiance Clydie Braun FU appt made PCCM to sign off   Oretha Milch, MD 03/20/2014, 10:38 AM

## 2014-03-20 NOTE — Progress Notes (Signed)
[facility] PROGRESS NOTE  Jordan Cline ZOX:[id number] DOB: 1975/03/28 DOA: 03/16/2014 PCP: No primary care provider on file.  Assessment/Plan: 1-Saddle PE, possible lingular infarct:  -ECHO; Ventricular septum: D-shaped interventricular septum suggesting RV pressure/volume overload. Atrial septum: Bubble study difficult, possibly positive suggesting PFO. -S/P IR infusion Thrombolysis 2-2  -Vitals stable.  -Stared on xarelto. Follow up with Dr Excell Seltzercooper for further evaluation treatment of PFO.  No need for IVC filter per Dr Vassie LollAlva.  Had mild chest pain and cough this morning. Will check chest x ray.    2-DVT right Popliteal Vein:  Continue with xarelto.   3-Diabetes; history of pre-diabetic. Hb-A 1c at 13. He will need to be discharge on insulin. Start lantus 20 units today. Diabetic education.   4-Acute hypoxic Respiratory failure; secondary to PE. Stable. See problem Number one.   Code Status: Full Code.  Family Communication: Care discussed with patient and fiance.  Disposition Plan: Home when stable. Start oral anticoagulation today.    Consultants:  CCM, Pulmonologist  Procedures:  Doppler LE; - Findings consistent with acute deep vein thrombosis involving the right popliteal vein. - No obvious evidence of deep vein thrombosis involving the left lower extremity. - No evidence of Baker&'s cyst on the right or left.  ECHO; - Normal LV size with mild LV hypertrophy. EF 60-65%. The RV was moderately dilated with moderately decreased systolic function. D-shaped septum suggestive of RV pressure/volume overload. No significant valvular abnormalities. This picture could be consistent with a hemodynamically significant pulmonary embolus. PFO  IR direct Thrombolysis.   Antibiotics:  none  HPI/Subjective: Had an episode of chest pain, denies worsening dyspnea.   Objective: Filed Vitals:   03/20/14 1322  BP: 108/65  Pulse: 97  Temp: 98.1 F (36.7  C)  Resp: 19    Intake/Output Summary (Last 24 hours) at 03/20/14 1642 Last data filed at 03/20/14 1321  Gross per 24 hour  Intake    940 ml  Output    400 ml  Net    540 ml   Filed Weights   03/18/14 0500 03/19/14 0422 03/20/14 0507  Weight: 145.7 kg (321 lb 3.4 oz) 147.4 kg (324 lb 15.3 oz) 147.3 kg (324 lb 11.8 oz)    Exam:   General:  Alert in no distress.   Cardiovascular: S 1, S 2 RRR  Respiratory: Bilateral Crackles, no wheezing.   Abdomen: Bs present, soft, NT  Musculoskeletal: no edema  Data Reviewed: Basic Metabolic Panel:  Recent Labs Lab 03/16/14 1935 03/16/14 2350 03/17/14 0605 03/18/14 0422 03/20/14 0534  NA 132* 133* 135 137 137  K 3.6 4.5 3.9 4.0 3.5  CL 99 100 106 104 104  CO2 24 20 21 24 26   GLUCOSE 344* 383* 308* 278* 199*  BUN 10 8 6 6 7   CREATININE 0.95 1.05 0.91 1.06 0.89  CALCIUM 9.1 9.3 7.8* 8.6 8.8  MG  --  1.6 1.5  --  1.7  PHOS  --  3.9 3.5  --   --    Liver Function Tests:  Recent Labs Lab 03/16/14 1935  AST 27  ALT 28  ALKPHOS 101  BILITOT 0.5  PROT 7.9  ALBUMIN 4.2   No results for input(s): LIPASE, AMYLASE in the last 168 hours. No results for input(s): AMMONIA in the last 168 hours. CBC:  Recent Labs Lab 03/16/14 1935  03/17/14 1500 03/17/14 2200 03/18/14 0422 03/19/14 0500 03/20/14 0534  WBC 8.3  < > 9.3 9.9 9.7 8.0 6.3  NEUTROABS 5.2  --   --   --   --   --   --   HGB 15.3  < > 13.5 12.3* 12.8* 11.9* 12.3*  HCT 43.1  < > 38.6* 35.8* 37.8* 34.8* 36.8*  MCV 75.3*  < > 76.4* 77.0* 77.8* 77.9* 79.0  PLT 243  < > 180 168 187 185 215  < > = values in this interval not displayed. Cardiac Enzymes:  Recent Labs Lab 03/16/14 1935 03/16/14 2350 03/17/14 0605 03/17/14 1500  TROPONINI 0.03 0.22* 0.35* 0.27*   BNP (last 3 results)  Recent Labs  03/16/14 2103 03/16/14 2350  BNP 31.3 14.8    ProBNP (last 3 results) No results for input(s): PROBNP in the last 8760 hours.  CBG:  Recent Labs Lab  03/19/14 1621 03/19/14 2137 03/20/14 0552 03/20/14 1149 03/20/14 1627  GLUCAP 184* 219* 179* 220* 202*    Recent Results (from the past 240 hour(s))  MRSA PCR Screening     Status: None   Collection Time: 03/16/14 11:12 PM  Result Value Ref Range Status   MRSA by PCR NEGATIVE NEGATIVE Final    Comment:        The GeneXpert MRSA Assay (FDA approved for NASAL specimens only), is one component of a comprehensive MRSA colonization surveillance program. It is not intended to diagnose MRSA infection nor to guide or monitor treatment for MRSA infections.      Studies: Dg Chest 2 View  03/20/2014   CLINICAL DATA:  Productive cough, left chest tightness  EXAM: CHEST  2 VIEW  COMPARISON:  03/16/2014  FINDINGS: Cardiomediastinal silhouette is stable. Central mild vascular congestion without convincing pulmonary edema. There is elevation of the right hemidiaphragm again noted. There is hazy atelectasis or infiltrate in lingula and left base.  IMPRESSION: No convincing pulmonary edema. Hazy atelectasis or infiltrate in lingula and left base. Chronic elevation of the right hemidiaphragm.   Electronically Signed   By: Natasha Mead M.D.   On: 03/20/2014 09:37    Scheduled Meds: . guaiFENesin  600 mg Oral BID  . Influenza vac split quadrivalent PF  0.5 mL Intramuscular Tomorrow-1000  . insulin aspart  0-20 Units Subcutaneous TID WC & HS  . insulin glargine  20 Units Subcutaneous QHS  . pantoprazole  40 mg Oral Daily  . pneumococcal 23 valent vaccine  0.5 mL Intramuscular Tomorrow-1000  . polyethylene glycol  17 g Oral BID  . rivaroxaban  15 mg Oral BID WC   Followed by  . [START ON 04/09/2014] rivaroxaban  20 mg Oral Q supper  . senna  1 tablet Oral Daily  . sodium chloride  3 mL Intravenous Q12H   Continuous Infusions: . sodium chloride    . sodium chloride    . 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     Hartley Barefoot A  [facility] Pager [phone]. If 7PM-7AM, please contact night-coverage at [email or web address], password [facility] [place] [facility] 03/20/2014, 4:42 PM  LOS: 4 days

## 2014-03-20 NOTE — Progress Notes (Signed)
Pt reported brief left side chest pain 3/10.  EKG performed.  VSS, pt currently experiencing no pain.  Regalado MD paged, reviewed EKG, ordered magnesium level.  Will continue to monitor.  Erenest Rasheraldwell,Damisha Wolff B, RN

## 2014-03-21 LAB — CBC
HEMATOCRIT: 36 % — AB (ref 39.0–52.0)
HEMOGLOBIN: 12.3 g/dL — AB (ref 13.0–17.0)
MCH: 26.4 pg (ref 26.0–34.0)
MCHC: 34.2 g/dL (ref 30.0–36.0)
MCV: 77.3 fL — ABNORMAL LOW (ref 78.0–100.0)
Platelets: 237 10*3/uL (ref 150–400)
RBC: 4.66 MIL/uL (ref 4.22–5.81)
RDW: 14.2 % (ref 11.5–15.5)
WBC: 6.9 10*3/uL (ref 4.0–10.5)

## 2014-03-21 LAB — GLUCOSE, CAPILLARY: Glucose-Capillary: 174 mg/dL — ABNORMAL HIGH (ref 70–99)

## 2014-03-21 MED ORDER — INSULIN PEN NEEDLE 31G X 5 MM MISC: 1.0000 [IU] | Freq: Once | Status: DC

## 2014-03-21 MED ORDER — RIVAROXABAN 15 MG PO TABS: 15.0000 mg | ORAL_TABLET | Freq: Two times a day (BID) | ORAL | Status: DC

## 2014-03-21 MED ORDER — INSULIN GLARGINE 100 UNIT/ML SOLOSTAR PEN: 20.0000 [IU] | PEN_INJECTOR | Freq: Every day | SUBCUTANEOUS | Status: DC

## 2014-03-21 MED ORDER — BLOOD GLUCOSE MONITOR KIT: PACK | Status: AC

## 2014-03-21 MED ORDER — INSULIN ASPART 100 UNIT/ML FLEXPEN: 4.0000 [IU] | PEN_INJECTOR | Freq: Three times a day (TID) | SUBCUTANEOUS | Status: DC

## 2014-03-21 MED ORDER — RIVAROXABAN 20 MG PO TABS: 20.0000 mg | ORAL_TABLET | Freq: Every day | ORAL | Status: DC

## 2014-03-21 NOTE — Progress Notes (Signed)
Assessment unchanged. Discussed D/C instructions with pt including f/u appointments, new medications, signs and symptoms of hypoglycemia and hyperglycemia, foot care, insulin management, and bleeding precautions with Xarelto. Verbalized understanding. RX given to pt. IV and tele removed. Pt left with belongings accompanied by RN.

## 2014-03-21 NOTE — Discharge Summary (Signed)
Physician Discharge Summary  Jordan Cline DGL:[id number] DOB: January 19, 1976 DOA: 03/16/2014  PCP: No primary care provider on file.  Admit date: 03/16/2014 Discharge date: 03/21/2014  Time spent: 35 minutes  Recommendations for Outpatient Follow-up:  Need further adjustment of insulin.  Needs to follow up with Dr Burt Knack for further evaluation and treatment of PFO.  Follow up with pulmonary for Sleep study and further care PE.  Need evaluation for hypercoagulable state, might benefit from hematologist evaluation.   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   Discharge Condition: Stable.   Diet recommendation: Carb Modified.   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 Last few weeks has been having dyspnea, cough and passed it off as bronchitis. Some worse this past week along with gerd and atypical chest pain. On 03/16/14 upon entering car after leaving office building became very dyspneic, light headed and felt like passing out. [facility] to [facility] - and submassive PE with RV strain on CT angio confirmed (duplex LE data not available). Initial troponin normal. Started on IV heparin and transffered to [facility]. Prior to transfer decompensated from [facility] o2 to 100% face mask (? 15L via NRB). Dyspneic and anxious upon arrival in 2SICU at [facility]. PCCM admitting 03/16/2014 . Denies prololnged travel  Hospital Course:  1-Saddle PE, possible lingular infarct:  -ECHO; Ventricular septum: D-shaped  interventricular septum suggesting RV pressure/volume overload. Atrial septum: Bubble study difficult, possibly positive suggesting PFO. -S/P IR infusion Thrombolysis 2-2  -Vitals stable.  -Stared on xarelto. Follow up with Dr Burt Knack for further evaluation treatment of PFO.  -No need for IVC filter per Dr Elsworth Soho.  -chest x ray stable. Patient stable to be discharge today.   2-DVT right Popliteal Vein: Continue with xarelto.   3-Diabetes; history of pre-diabetic. Hb-A 1c at 13. He will need to be discharge on insulin.  lantus 20 units today. Diabetic education.   4-Acute hypoxic Respiratory failure; secondary to PE. Stable. See problem Number one.   Procedures:  Doppler LE; - Findings consistent with acute deep vein thrombosis involving the right popliteal vein. - No obvious evidence of deep vein thrombosis involving the left lower extremity. - No evidence of Baker&'s cyst on the right or left.  ECHO; - Normal LV size with mild LV hypertrophy. EF 60-65%. The RV was moderately dilated with moderately decreased systolic function. D-shaped septum suggestive of RV pressure/volume overload. No significant valvular abnormalities. This picture could be consistent with a hemodynamically significant pulmonary embolus. PFO  IR direct Thrombolysis.  Consultations:  CCM  Discharge Exam: Filed Vitals:   03/21/14 0735  BP:   Pulse: 103  Temp:   Resp:     General: NAD Cardiovascular: S 1, S 2 RRR Respiratory: CTA  Discharge Instructions   Discharge Instructions    Ambulatory referral to Nutrition and Diabetic Education    Complete by:  As directed   New onset diabetes.  A1C=13.0%.  New  to insulin     Diet Carb Modified    Complete by:  As directed      Increase activity slowly    Complete by:  As directed           Discharge Medication List as of 03/21/2014 10:09 AM    START taking these medications   Details  blood glucose meter kit and supplies KIT  Dispense based on patient and insurance preference. Use up to four times daily as directed. (FOR ICD-9 250.00, 250.01)., Print    insulin aspart (NOVOLOG FLEXPEN) 100 UNIT/ML FlexPen Inject 4 Units into the skin 3 (three) times daily with meals., Starting 03/21/2014, Until Discontinued, Print    Insulin Glargine (LANTUS SOLOSTAR) 100 UNIT/ML Solostar Pen Inject 20 Units into the skin daily at 10 pm., Starting 03/21/2014, Until Discontinued, Print    Insulin Pen Needle (B-D UF III MINI PEN NEEDLES) 31G X 5 MM MISC 1 Units by Does not apply route once., Starting 03/21/2014, Print      CONTINUE these medications which have CHANGED   Details  !! Rivaroxaban (XARELTO) 15 MG TABS tablet Take 1 tablet (15 mg total) by mouth 2 (two) times daily with a meal., Starting 03/21/2014, Until Discontinued, Print    !! rivaroxaban (XARELTO) 20 MG TABS tablet Take 1 tablet (20 mg total) by mouth daily with supper., Starting 04/09/2014, Until Discontinued, Print     !! - Potential duplicate medications found. Please discuss with provider.    CONTINUE these medications which have NOT CHANGED   Details  guaiFENesin (MUCINEX) 600 MG 12 hr tablet Take 600 mg by mouth 2 (two) times daily as needed for cough or to loosen phlegm., Until Discontinued, Historical Med       No Known Allergies Follow-up Information    Follow up with PARRETT,TAMMY, NP On 04/03/2014.   Specialty:  Nurse Practitioner   Why:  2pm   Contact information:   [address] [address] [phone]       Follow up with Rigoberto Noel., MD In 1 week.   Specialty:  Pulmonary Disease   Contact information:   [address] [address] [phone]       Follow up with Sherren Mocha, MD In 1 week.   Specialty:  Cardiology   Contact information:   [address] [place] [address] [phone]        The results of significant diagnostics from this hospitalization (including imaging,  microbiology, ancillary and laboratory) are listed below for reference.    Significant Diagnostic Studies: Dg Chest 2 View  03/20/2014   CLINICAL DATA:  Productive cough, left chest tightness  EXAM: CHEST  2 VIEW  COMPARISON:  03/16/2014  FINDINGS: Cardiomediastinal silhouette is stable. Central mild vascular congestion without convincing pulmonary edema. There is elevation of the right hemidiaphragm again noted. There is hazy atelectasis or infiltrate in lingula and left base.  IMPRESSION: No convincing pulmonary edema. Hazy atelectasis or infiltrate in lingula and left base. Chronic elevation of the right hemidiaphragm.   Electronically Signed   By: Lahoma Crocker M.D.   On: 03/20/2014 09:37   Ct Angio Chest Pe W/cm &/or Wo Cm  03/16/2014   CLINICAL DATA:  Shortness of breath, difficulty breathing.  EXAM: CT ANGIOGRAPHY CHEST WITH CONTRAST  TECHNIQUE: Multidetector CT imaging of the chest was performed using the standard protocol during bolus administration of intravenous contrast. Multiplanar CT image reconstructions and MIPs were obtained to  evaluate the vascular anatomy.  CONTRAST:  122m OMNIPAQUE IOHEXOL 350 MG/ML SOLN  COMPARISON:  None.  FINDINGS: There is a saddle embolus noted with extension of the emboli predominantly into the lower lobes bilaterally, but also noted in both upper lobes. The right ventricle to left ventricle ratio is elevated at 1.59 suggesting right ventricular strain. Heart is normal size. Aorta is normal caliber. No mediastinal, hilar, or axillary adenopathy. Chest wall soft tissues are unremarkable.  Airspace opacification noted in the lingula. There appears to be embolus within the subtending pulmonary artery suggesting this could represent pulmonary infarct. Trace left pleural effusion noted.  Imaging into the upper abdomen shows no acute findings. There appears to be diffuse fatty infiltration of the liver.  Review of the MIP images confirms the above findings.  IMPRESSION: Saddle  embolus would large emboli extending into both lower lobes. Emboli also noted in both upper lobes. Possible early lingular pulmonary infarct.  Elevated RV to LV ratio suggest right heart strain and (at least) sub massive pulmonary embolus.  Critical Value/emergent results were called by telephone at the time of interpretation on 03/16/2014 at 9:53 pm to Dr. MCharlesetta Shanks, who verbally acknowledged these results.   Electronically Signed   By: KRolm BaptiseM.D.   On: 03/16/2014 21:55   Ir Angiogram Pulmonary Bilateral Selective  03/18/2014   CLINICAL DATA:  Acute saddle pulmonary emboli, chest pain, respiratory distress, right heart strain  EXAM: ULTRASOUND GUIDANCE FOR VASCULAR ACCESS  BILATERAL PULMONARY ARTERY CATHETERIZATIONS AND ANGIOGRAMS  SVC VENOGRAM  INITIATION OF TRANSCATHETER ARTERIAL THROMBOLYTIC INFUSION FOR PULMONARY EMBOLUS LYSIS  Date:  2/2/20162/03/2014 5:06 am  Radiologist:  M. TDaryll Brod MD  Guidance:  Ultrasound and fluoroscopic  FLUOROSCOPY TIME:  25 min 48 seconds  MEDICATIONS AND MEDICAL HISTORY: 3 mg Versed, 150 mcg fentanyl  ANESTHESIA/SEDATION: 90 min  CONTRAST:  1073mOMNIPAQUE IOHEXOL 300 MG/ML  SOLN  COMPLICATIONS: None immediate  PROCEDURE: Informed consent was obtained from the patient following explanation of the procedure, risks, benefits and alternatives. The patient understands, agrees and consents for the procedure. All questions were addressed. A time out was performed.  Maximal barrier sterile technique utilized including caps, mask, sterile gowns, sterile gloves, large sterile drape, hand hygiene, and Betadine.  Under sterile conditions and local anesthesia, ultrasound micropuncture access was performed of the right common femoral vein. Images obtained for documentation. Tandem 6 French sheaths were inserted at separate puncture sites into the right common femoral vein. Bernstein catheter and Bentson guidewire were utilized to advance the access into the right atrium.  Catheter and guidewire access were advanced into the left hilar region initially. Contrast injection confirms catheter access within the left pulmonary vein suspicious for an atrial septal defect. This was retracted and advanced into the right hilum. Again, contrast injection confirms position within the pulmonary vein suspicious for an atrial septal defect.  The catheter was then advanced into the right innominate vein. Contrast injection performed for SVC venogram. Innominate vein and SVC are patent.  Eventually, the Berenstein catheter and guidewire were advanced into the pulmonary outflow tract and more peripherally into the left lower lobe pulmonary artery. Contrast injection confirms position within the pulmonary arterial vasculature. Filling defects present extending into the left lower lobe compatible with acute known pulmonary emboli. Rosen guidewire was advanced into the left lower lobe pulmonary artery. This access was secured and attention was directed to right pulmonary artery catheterization.  In a similar fashion, a second Bernstein catheter and Bentson guidewire  were utilized to advance the access into the pulmonary outflow tract and directed to the right lower lobe pulmonary artery. Eventually access was advanced into the right lower lobe. Contrast injection confirms position in the right lower lobe pulmonary artery. A second Rosen guidewire was advanced into the right lower lobe pulmonary artery.  Pulmonary arterial pressure measurements were obtained. Pulmonary artery pressure 54/25.  Over the [facility] guidewires, the EKOS infusion outer catheters were advanced bilaterally into the lower lobe pulmonary arterial positions. The inner EKOS ultrasound wire was advanced through the delivery catheters. Infusion length on the left is 18 cm and the right 12 cm. Images obtained for documentation. Access secured externally.  TPA thrombolytic infusion will be initiated bilaterally at 1 milligram/hour per  catheter for 12 hrs. Total dose 24 mg.  IMPRESSION: Successful insertion of bilateral pulmonary arterial EKOS ultrasound assisted infusion catheters for pulmonary embolus thrombolysis.  findings suspicious for atrial septal defect as detailed above. Recommend follow-up echocardiogram.   Electronically Signed   By: Daryll Brod M.D.   On: 03/18/2014 09:56   Ir Angiogram Selective Each Additional Vessel  03/18/2014   CLINICAL DATA:  Acute saddle pulmonary emboli, chest pain, respiratory distress, right heart strain  EXAM: ULTRASOUND GUIDANCE FOR VASCULAR ACCESS  BILATERAL PULMONARY ARTERY CATHETERIZATIONS AND ANGIOGRAMS  SVC VENOGRAM  INITIATION OF TRANSCATHETER ARTERIAL THROMBOLYTIC INFUSION FOR PULMONARY EMBOLUS LYSIS  Date:  2/2/20162/03/2014 5:06 am  Radiologist:  M. Daryll Brod, MD  Guidance:  Ultrasound and fluoroscopic  FLUOROSCOPY TIME:  25 min 48 seconds  MEDICATIONS AND MEDICAL HISTORY: 3 mg Versed, 150 mcg fentanyl  ANESTHESIA/SEDATION: 90 min  CONTRAST:  155m OMNIPAQUE IOHEXOL 300 MG/ML  SOLN  COMPLICATIONS: None immediate  PROCEDURE: Informed consent was obtained from the patient following explanation of the procedure, risks, benefits and alternatives. The patient understands, agrees and consents for the procedure. All questions were addressed. A time out was performed.  Maximal barrier sterile technique utilized including caps, mask, sterile gowns, sterile gloves, large sterile drape, hand hygiene, and Betadine.  Under sterile conditions and local anesthesia, ultrasound micropuncture access was performed of the right common femoral vein. Images obtained for documentation. Tandem 6 French sheaths were inserted at separate puncture sites into the right common femoral vein. Bernstein catheter and Bentson guidewire were utilized to advance the access into the right atrium. Catheter and guidewire access were advanced into the left hilar region initially. Contrast injection confirms catheter access within  the left pulmonary vein suspicious for an atrial septal defect. This was retracted and advanced into the right hilum. Again, contrast injection confirms position within the pulmonary vein suspicious for an atrial septal defect.  The catheter was then advanced into the right innominate vein. Contrast injection performed for SVC venogram. Innominate vein and SVC are patent.  Eventually, the Berenstein catheter and guidewire were advanced into the pulmonary outflow tract and more peripherally into the left lower lobe pulmonary artery. Contrast injection confirms position within the pulmonary arterial vasculature. Filling defects present extending into the left lower lobe compatible with acute known pulmonary emboli. Rosen guidewire was advanced into the left lower lobe pulmonary artery. This access was secured and attention was directed to right pulmonary artery catheterization.  In a similar fashion, a second Bernstein catheter and Bentson guidewire were utilized to advance the access into the pulmonary outflow tract and directed to the right lower lobe pulmonary artery. Eventually access was advanced into the right lower lobe. Contrast injection confirms position in the right lower lobe pulmonary  artery. A second Rosen guidewire was advanced into the right lower lobe pulmonary artery.  Pulmonary arterial pressure measurements were obtained. Pulmonary artery pressure 54/25.  Over the [facility] guidewires, the EKOS infusion outer catheters were advanced bilaterally into the lower lobe pulmonary arterial positions. The inner EKOS ultrasound wire was advanced through the delivery catheters. Infusion length on the left is 18 cm and the right 12 cm. Images obtained for documentation. Access secured externally.  TPA thrombolytic infusion will be initiated bilaterally at 1 milligram/hour per catheter for 12 hrs. Total dose 24 mg.  IMPRESSION: Successful insertion of bilateral pulmonary arterial EKOS ultrasound assisted infusion  catheters for pulmonary embolus thrombolysis.  findings suspicious for atrial septal defect as detailed above. Recommend follow-up echocardiogram.   Electronically Signed   By: Daryll Brod M.D.   On: 03/18/2014 09:56   Ir Angiogram Selective Each Additional Vessel  03/18/2014   CLINICAL DATA:  Acute saddle pulmonary emboli, chest pain, respiratory distress, right heart strain  EXAM: ULTRASOUND GUIDANCE FOR VASCULAR ACCESS  BILATERAL PULMONARY ARTERY CATHETERIZATIONS AND ANGIOGRAMS  SVC VENOGRAM  INITIATION OF TRANSCATHETER ARTERIAL THROMBOLYTIC INFUSION FOR PULMONARY EMBOLUS LYSIS  Date:  2/2/20162/03/2014 5:06 am  Radiologist:  M. Daryll Brod, MD  Guidance:  Ultrasound and fluoroscopic  FLUOROSCOPY TIME:  25 min 48 seconds  MEDICATIONS AND MEDICAL HISTORY: 3 mg Versed, 150 mcg fentanyl  ANESTHESIA/SEDATION: 90 min  CONTRAST:  165m OMNIPAQUE IOHEXOL 300 MG/ML  SOLN  COMPLICATIONS: None immediate  PROCEDURE: Informed consent was obtained from the patient following explanation of the procedure, risks, benefits and alternatives. The patient understands, agrees and consents for the procedure. All questions were addressed. A time out was performed.  Maximal barrier sterile technique utilized including caps, mask, sterile gowns, sterile gloves, large sterile drape, hand hygiene, and Betadine.  Under sterile conditions and local anesthesia, ultrasound micropuncture access was performed of the right common femoral vein. Images obtained for documentation. Tandem 6 French sheaths were inserted at separate puncture sites into the right common femoral vein. Bernstein catheter and Bentson guidewire were utilized to advance the access into the right atrium. Catheter and guidewire access were advanced into the left hilar region initially. Contrast injection confirms catheter access within the left pulmonary vein suspicious for an atrial septal defect. This was retracted and advanced into the right hilum. Again, contrast  injection confirms position within the pulmonary vein suspicious for an atrial septal defect.  The catheter was then advanced into the right innominate vein. Contrast injection performed for SVC venogram. Innominate vein and SVC are patent.  Eventually, the Berenstein catheter and guidewire were advanced into the pulmonary outflow tract and more peripherally into the left lower lobe pulmonary artery. Contrast injection confirms position within the pulmonary arterial vasculature. Filling defects present extending into the left lower lobe compatible with acute known pulmonary emboli. Rosen guidewire was advanced into the left lower lobe pulmonary artery. This access was secured and attention was directed to right pulmonary artery catheterization.  In a similar fashion, a second Bernstein catheter and Bentson guidewire were utilized to advance the access into the pulmonary outflow tract and directed to the right lower lobe pulmonary artery. Eventually access was advanced into the right lower lobe. Contrast injection confirms position in the right lower lobe pulmonary artery. A second Rosen guidewire was advanced into the right lower lobe pulmonary artery.  Pulmonary arterial pressure measurements were obtained. Pulmonary artery pressure 54/25.  Over the [facility], the EKOS infusion outer catheters were advanced bilaterally into  the lower lobe pulmonary arterial positions. The inner EKOS ultrasound wire was advanced through the delivery catheters. Infusion length on the left is 18 cm and the right 12 cm. Images obtained for documentation. Access secured externally.  TPA thrombolytic infusion will be initiated bilaterally at 1 milligram/hour per catheter for 12 hrs. Total dose 24 mg.  IMPRESSION: Successful insertion of bilateral pulmonary arterial EKOS ultrasound assisted infusion catheters for pulmonary embolus thrombolysis.  findings suspicious for atrial septal defect as detailed above. Recommend follow-up  echocardiogram.   Electronically Signed   By: Daryll Brod M.D.   On: 03/18/2014 09:56   Ir Venocavagram Svc  03/18/2014   CLINICAL DATA:  Acute saddle pulmonary emboli, chest pain, respiratory distress, right heart strain  EXAM: ULTRASOUND GUIDANCE FOR VASCULAR ACCESS  BILATERAL PULMONARY ARTERY CATHETERIZATIONS AND ANGIOGRAMS  SVC VENOGRAM  INITIATION OF TRANSCATHETER ARTERIAL THROMBOLYTIC INFUSION FOR PULMONARY EMBOLUS LYSIS  Date:  2/2/20162/03/2014 5:06 am  Radiologist:  M. Daryll Brod, MD  Guidance:  Ultrasound and fluoroscopic  FLUOROSCOPY TIME:  25 min 48 seconds  MEDICATIONS AND MEDICAL HISTORY: 3 mg Versed, 150 mcg fentanyl  ANESTHESIA/SEDATION: 90 min  CONTRAST:  116m OMNIPAQUE IOHEXOL 300 MG/ML  SOLN  COMPLICATIONS: None immediate  PROCEDURE: Informed consent was obtained from the patient following explanation of the procedure, risks, benefits and alternatives. The patient understands, agrees and consents for the procedure. All questions were addressed. A time out was performed.  Maximal barrier sterile technique utilized including caps, mask, sterile gowns, sterile gloves, large sterile drape, hand hygiene, and Betadine.  Under sterile conditions and local anesthesia, ultrasound micropuncture access was performed of the right common femoral vein. Images obtained for documentation. Tandem 6 French sheaths were inserted at separate puncture sites into the right common femoral vein. Bernstein catheter and Bentson guidewire were utilized to advance the access into the right atrium. Catheter and guidewire access were advanced into the left hilar region initially. Contrast injection confirms catheter access within the left pulmonary vein suspicious for an atrial septal defect. This was retracted and advanced into the right hilum. Again, contrast injection confirms position within the pulmonary vein suspicious for an atrial septal defect.  The catheter was then advanced into the right innominate vein.  Contrast injection performed for SVC venogram. Innominate vein and SVC are patent.  Eventually, the Berenstein catheter and guidewire were advanced into the pulmonary outflow tract and more peripherally into the left lower lobe pulmonary artery. Contrast injection confirms position within the pulmonary arterial vasculature. Filling defects present extending into the left lower lobe compatible with acute known pulmonary emboli. Rosen guidewire was advanced into the left lower lobe pulmonary artery. This access was secured and attention was directed to right pulmonary artery catheterization.  In a similar fashion, a second Bernstein catheter and Bentson guidewire were utilized to advance the access into the pulmonary outflow tract and directed to the right lower lobe pulmonary artery. Eventually access was advanced into the right lower lobe. Contrast injection confirms position in the right lower lobe pulmonary artery. A second Rosen guidewire was advanced into the right lower lobe pulmonary artery.  Pulmonary arterial pressure measurements were obtained. Pulmonary artery pressure 54/25.  Over the [facility], the EKOS infusion outer catheters were advanced bilaterally into the lower lobe pulmonary arterial positions. The inner EKOS ultrasound wire was advanced through the delivery catheters. Infusion length on the left is 18 cm and the right 12 cm. Images obtained for documentation. Access secured externally.  TPA thrombolytic infusion will  be initiated bilaterally at 1 milligram/hour per catheter for 12 hrs. Total dose 24 mg.  IMPRESSION: Successful insertion of bilateral pulmonary arterial EKOS ultrasound assisted infusion catheters for pulmonary embolus thrombolysis.  findings suspicious for atrial septal defect as detailed above. Recommend follow-up echocardiogram.   Electronically Signed   By: Daryll Brod M.D.   On: 03/18/2014 09:56   Ir US Guide Vasc Access Right  03/18/2014   CLINICAL DATA:  Acute  saddle pulmonary emboli, chest pain, respiratory distress, right heart strain  EXAM: ULTRASOUND GUIDANCE FOR VASCULAR ACCESS  BILATERAL PULMONARY ARTERY CATHETERIZATIONS AND ANGIOGRAMS  SVC VENOGRAM  INITIATION OF TRANSCATHETER ARTERIAL THROMBOLYTIC INFUSION FOR PULMONARY EMBOLUS LYSIS  Date:  2/2/20162/03/2014 5:06 am  Radiologist:  M. Daryll Brod, MD  Guidance:  Ultrasound and fluoroscopic  FLUOROSCOPY TIME:  25 min 48 seconds  MEDICATIONS AND MEDICAL HISTORY: 3 mg Versed, 150 mcg fentanyl  ANESTHESIA/SEDATION: 90 min  CONTRAST:  190m OMNIPAQUE IOHEXOL 300 MG/ML  SOLN  COMPLICATIONS: None immediate  PROCEDURE: Informed consent was obtained from the patient following explanation of the procedure, risks, benefits and alternatives. The patient understands, agrees and consents for the procedure. All questions were addressed. A time out was performed.  Maximal barrier sterile technique utilized including caps, mask, sterile gowns, sterile gloves, large sterile drape, hand hygiene, and Betadine.  Under sterile conditions and local anesthesia, ultrasound micropuncture access was performed of the right common femoral vein. Images obtained for documentation. Tandem 6 French sheaths were inserted at separate puncture sites into the right common femoral vein. Bernstein catheter and Bentson guidewire were utilized to advance the access into the right atrium. Catheter and guidewire access were advanced into the left hilar region initially. Contrast injection confirms catheter access within the left pulmonary vein suspicious for an atrial septal defect. This was retracted and advanced into the right hilum. Again, contrast injection confirms position within the pulmonary vein suspicious for an atrial septal defect.  The catheter was then advanced into the right innominate vein. Contrast injection performed for SVC venogram. Innominate vein and SVC are patent.  Eventually, the Berenstein catheter and guidewire were advanced into  the pulmonary outflow tract and more peripherally into the left lower lobe pulmonary artery. Contrast injection confirms position within the pulmonary arterial vasculature. Filling defects present extending into the left lower lobe compatible with acute known pulmonary emboli. Rosen guidewire was advanced into the left lower lobe pulmonary artery. This access was secured and attention was directed to right pulmonary artery catheterization.  In a similar fashion, a second Bernstein catheter and Bentson guidewire were utilized to advance the access into the pulmonary outflow tract and directed to the right lower lobe pulmonary artery. Eventually access was advanced into the right lower lobe. Contrast injection confirms position in the right lower lobe pulmonary artery. A second Rosen guidewire was advanced into the right lower lobe pulmonary artery.  Pulmonary arterial pressure measurements were obtained. Pulmonary artery pressure 54/25.  Over the [facility]ires, the EKOS infusion outer catheters were advanced bilaterally into the lower lobe pulmonary arterial positions. The inner EKOS ultrasound wire was advanced through the delivery catheters. Infusion length on the left is 18 cm and the right 12 cm. Images obtained for documentation. Access secured externally.  TPA thrombolytic infusion will be initiated bilaterally at 1 milligram/hour per catheter for 12 hrs. Total dose 24 mg.  IMPRESSION: Successful insertion of bilateral pulmonary arterial EKOS ultrasound assisted infusion catheters for pulmonary embolus thrombolysis.  findings suspicious for atrial septal defect  as detailed above. Recommend follow-up echocardiogram.   Electronically Signed   By: Daryll Brod M.D.   On: 03/18/2014 09:56   Dg Chest Port 1 View  03/16/2014   CLINICAL DATA:  Acute onset of shortness of breath and difficulty breathing. Initial encounter.  EXAM: PORTABLE CHEST - 1 VIEW  COMPARISON:  None.  FINDINGS: The lungs are well-aerated.  Vascular congestion is noted. Mildly increased interstitial markings could reflect mild interstitial edema. There is no evidence of pleural effusion or pneumothorax.  The cardiomediastinal silhouette is within normal limits. No acute osseous abnormalities are seen.  IMPRESSION: Vascular congestion noted. Mildly increased interstitial markings could reflect mild interstitial edema, given the patient's symptoms.   Electronically Signed   By: Garald Balding M.D.   On: 03/16/2014 20:27   Ir Infusion Thrombol Arterial Initial (ms)  03/18/2014   CLINICAL DATA:  Acute saddle pulmonary emboli, chest pain, respiratory distress, right heart strain  EXAM: ULTRASOUND GUIDANCE FOR VASCULAR ACCESS  BILATERAL PULMONARY ARTERY CATHETERIZATIONS AND ANGIOGRAMS  SVC VENOGRAM  INITIATION OF TRANSCATHETER ARTERIAL THROMBOLYTIC INFUSION FOR PULMONARY EMBOLUS LYSIS  Date:  2/2/20162/03/2014 5:06 am  Radiologist:  M. Daryll Brod, MD  Guidance:  Ultrasound and fluoroscopic  FLUOROSCOPY TIME:  25 min 48 seconds  MEDICATIONS AND MEDICAL HISTORY: 3 mg Versed, 150 mcg fentanyl  ANESTHESIA/SEDATION: 90 min  CONTRAST:  180m OMNIPAQUE IOHEXOL 300 MG/ML  SOLN  COMPLICATIONS: None immediate  PROCEDURE: Informed consent was obtained from the patient following explanation of the procedure, risks, benefits and alternatives. The patient understands, agrees and consents for the procedure. All questions were addressed. A time out was performed.  Maximal barrier sterile technique utilized including caps, mask, sterile gowns, sterile gloves, large sterile drape, hand hygiene, and Betadine.  Under sterile conditions and local anesthesia, ultrasound micropuncture access was performed of the right common femoral vein. Images obtained for documentation. Tandem 6 French sheaths were inserted at separate puncture sites into the right common femoral vein. Bernstein catheter and Bentson guidewire were utilized to advance the access into the right atrium.  Catheter and guidewire access were advanced into the left hilar region initially. Contrast injection confirms catheter access within the left pulmonary vein suspicious for an atrial septal defect. This was retracted and advanced into the right hilum. Again, contrast injection confirms position within the pulmonary vein suspicious for an atrial septal defect.  The catheter was then advanced into the right innominate vein. Contrast injection performed for SVC venogram. Innominate vein and SVC are patent.  Eventually, the Berenstein catheter and guidewire were advanced into the pulmonary outflow tract and more peripherally into the left lower lobe pulmonary artery. Contrast injection confirms position within the pulmonary arterial vasculature. Filling defects present extending into the left lower lobe compatible with acute known pulmonary emboli. Rosen guidewire was advanced into the left lower lobe pulmonary artery. This access was secured and attention was directed to right pulmonary artery catheterization.  In a similar fashion, a second Bernstein catheter and Bentson guidewire were utilized to advance the access into the pulmonary outflow tract and directed to the right lower lobe pulmonary artery. Eventually access was advanced into the right lower lobe. Contrast injection confirms position in the right lower lobe pulmonary artery. A second Rosen guidewire was advanced into the right lower lobe pulmonary artery.  Pulmonary arterial pressure measurements were obtained. Pulmonary artery pressure 54/25.  Over the [facility]ires, the EKOS infusion outer catheters were advanced bilaterally into the lower lobe pulmonary arterial positions. The inner  EKOS ultrasound wire was advanced through the delivery catheters. Infusion length on the left is 18 cm and the right 12 cm. Images obtained for documentation. Access secured externally.  TPA thrombolytic infusion will be initiated bilaterally at 1 milligram/hour per  catheter for 12 hrs. Total dose 24 mg.  IMPRESSION: Successful insertion of bilateral pulmonary arterial EKOS ultrasound assisted infusion catheters for pulmonary embolus thrombolysis.  findings suspicious for atrial septal defect as detailed above. Recommend follow-up echocardiogram.   Electronically Signed   By: Daryll Brod M.D.   On: 03/18/2014 09:56   Ir Jacolyn Reedy F/u Eval Art/ven Final Day (ms)  03/18/2014   CLINICAL DATA:  39 year old with bilateral pulmonary emboli. Follow-up bilateral pulmonary artery 12 hr tPA infusion.  EXAM: FOLLOW-UP THROMBOLYTIC THERAPY WITH FLUOROSCOPY  Physician: Stephan Minister. Henn, MD  FLUOROSCOPY TIME:  48 seconds, 29.2 mGy  MEDICATIONS: None  ANESTHESIA/SEDATION: Moderate sedation time: None  PROCEDURE: Patient was brought into the interventional suite. The wire was removed from the left pulmonary artery infusion catheter. The left pulmonary artery infusion catheter was connected to a transducer. Catheter was pulled back into the main left pulmonary artery. Catheter was further pulled back into the main pulmonary artery. Pressures were obtained from the left pulmonary artery catheter. The entire catheter was removed. The right pulmonary catheter was removed. Both right venous sheath were removed with manual compression. Manual compression was applied to the puncture sites. Patient was transferred back to the intensive care unit.  FINDINGS: Left pulmonary artery pressure: 47/33 mmHg, mean 55  Main pulmonary artery:  49/28 mmHg, mean 37  Stable position of the bilateral pulmonary artery catheters. Catheters are located in the lower lobe branches.  COMPLICATIONS: None  IMPRESSION: Completion of the catheter-directed thrombolysis for bilateral pulmonary emboli.  Minimal decrease in the pulmonary artery pressures following the thrombolytic infusion.  Removal of the pulmonary artery catheters and right groin sheaths.   Electronically Signed   By: Markus Daft M.D.   On: 03/18/2014 08:41     Microbiology: Recent Results (from the past 240 hour(s))  MRSA PCR Screening     Status: None   Collection Time: 03/16/14 11:12 PM  Result Value Ref Range Status   MRSA by PCR NEGATIVE NEGATIVE Final    Comment:        The GeneXpert MRSA Assay (FDA approved for NASAL specimens only), is one component of a comprehensive MRSA colonization surveillance program. It is not intended to diagnose MRSA infection nor to guide or monitor treatment for MRSA infections.      Labs: Basic Metabolic Panel:  Recent Labs Lab 03/16/14 1935 03/16/14 2350 03/17/14 0605 03/18/14 0422 03/20/14 0534  NA 132* 133* 135 137 137  K 3.6 4.5 3.9 4.0 3.5  CL 99 100 106 104 104  CO2 24 20 21 24 26   GLUCOSE 344* 383* 308* 278* 199*  BUN 10 8 6 6 7   CREATININE 0.95 1.05 0.91 1.06 0.89  CALCIUM 9.1 9.3 7.8* 8.6 8.8  MG  --  1.6 1.5  --  1.7  PHOS  --  3.9 3.5  --   --    Liver Function Tests:  Recent Labs Lab 03/16/14 1935  AST 27  ALT 28  ALKPHOS 101  BILITOT 0.5  PROT 7.9  ALBUMIN 4.2   No results for input(s): LIPASE, AMYLASE in the last 168 hours. No results for input(s): AMMONIA in the last 168 hours. CBC:  Recent Labs Lab 03/16/14 1935  03/17/14 2200 03/18/14 0422  03/19/14 0500 03/20/14 0534 03/21/14 0345  WBC 8.3  < > 9.9 9.7 8.0 6.3 6.9  NEUTROABS 5.2  --   --   --   --   --   --   HGB 15.3  < > 12.3* 12.8* 11.9* 12.3* 12.3*  HCT 43.1  < > 35.8* 37.8* 34.8* 36.8* 36.0*  MCV 75.3*  < > 77.0* 77.8* 77.9* 79.0 77.3*  PLT 243  < > 168 187 185 215 237  < > = values in this interval not displayed. Cardiac Enzymes:  Recent Labs Lab 03/16/14 1935 03/16/14 2350 03/17/14 0605 03/17/14 1500  TROPONINI 0.03 0.22* 0.35* 0.27*   BNP: BNP (last 3 results)  Recent Labs  03/16/14 2103 03/16/14 2350  BNP 31.3 14.8    ProBNP (last 3 results) No results for input(s): PROBNP in the last 8760 hours.  CBG:  Recent Labs Lab 03/20/14 0552 03/20/14 1149  03/20/14 1627 03/20/14 2056 03/21/14 0618  GLUCAP 179* 220* 202* 155* 174*       Signed:  Lynnae Ludemann A  [facility] 03/21/2014, 8:22 PM

## 2014-03-23 ENCOUNTER — Other Ambulatory Visit: Payer: Self-pay | Admitting: Pulmonary Disease

## 2014-03-23 DIAGNOSIS — G4733 Obstructive sleep apnea (adult) (pediatric): Secondary | ICD-10-CM

## 2014-04-03 ENCOUNTER — Inpatient Hospital Stay: Payer: Managed Care, Other (non HMO) | Admitting: Adult Health

## 2014-04-20 ENCOUNTER — Ambulatory Visit (INDEPENDENT_AMBULATORY_CARE_PROVIDER_SITE_OTHER): Payer: Managed Care, Other (non HMO) | Admitting: Adult Health

## 2014-04-20 ENCOUNTER — Encounter: Payer: Self-pay | Admitting: Adult Health

## 2014-04-20 VITALS — BP 126/82 | HR 82 | Temp 98.2°F | Ht 73.5 in | Wt 335.6 lb

## 2014-04-20 DIAGNOSIS — I82431 Acute embolism and thrombosis of right popliteal vein: Secondary | ICD-10-CM

## 2014-04-20 DIAGNOSIS — I2602 Saddle embolus of pulmonary artery with acute cor pulmonale: Secondary | ICD-10-CM

## 2014-04-20 DIAGNOSIS — Q211 Atrial septal defect: Secondary | ICD-10-CM

## 2014-04-20 DIAGNOSIS — Q2112 Patent foramen ovale: Secondary | ICD-10-CM

## 2014-04-20 DIAGNOSIS — G4719 Other hypersomnia: Secondary | ICD-10-CM

## 2014-04-20 DIAGNOSIS — I2699 Other pulmonary embolism without acute cor pulmonale: Secondary | ICD-10-CM

## 2014-04-20 NOTE — Assessment & Plan Note (Signed)
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 Follow-up in 2 months with Dr. Vassie LollAlva Hematology referral has been made for an unprovoked DVT and PE as there is concern for possible underlying clotting disorder with family history of PE.

## 2014-04-20 NOTE — Patient Instructions (Signed)
Continue on Xarelto 20mg  daily .  Keep legs elevated as able  Call if you see any bleeding immediately.  We are referring you to hematology for your pulmonary embolism.  Follow up with cardiology as planned .  Follow up Dr. Vassie LollAlva  In 2 months and As needed

## 2014-04-20 NOTE — Assessment & Plan Note (Signed)
Questionable PFO on bubble study Patient was seen with cardiology during his hospitalization as been recommended to follow-up in outpatient setting Referral , has been sent.

## 2014-04-20 NOTE — Assessment & Plan Note (Signed)
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 Patient education given on Xarelto. She says he has plenty of refills and is now on his maintenance dose of Xarelto 20 mg daily. Patient advised to keep leg elevated. Follow-up in 2 months with Dr. Vassie LollAlva Hematology referral has been made for an unprovoked DVT and PE as there is concern for possible underlying clotting disorder with family history of PE.

## 2014-04-20 NOTE — Progress Notes (Signed)
Subjective:    Patient ID: Jordan Cline, male    DOB: 30-May-1975, 39 y.o.   MRN: [id number]  HPI 39 yo male , previous smoker, admitted with acute shortness of breath found to have a submassive PE with RV strain on CT angiogram, right DVT. Patient was started on heparin . However, was unstable and underwent catheter directed thrombolysis. Discharged on Xarelto.  Echo showed .Mild LVH, grade 1 diastolic dysfunction, RV moderately dilated with decreased systolic function. ? Bubble study difficult , possibly positive suggesting PFO.   04/20/2014 Post Hospital follow up  Patient returns for a post hospital follow-up Patient was admitted February 1 to February 6 for saddle pulmonary embolus with acute cor pulmonale requiring catheter directed thrombolysis.  Pt was presented with acute dyspnea and near syncope. He is a  smoker sedenatary life style (sits > 8h per day), grandmother possibly sufered PE. PTA-Last few weeks has been having dyspnea, cough and passed it off as bronchitis. Some worse this past week along with gerd and atypical chest pain. On 03/16/14 upon entering car after leaving office building became very dyspneic, light headed and felt like passing out. [facility] to [facility] - and submassive PE with RV strain on CT angio confirmed (duplex LE data not available). Initial troponin normal. Started on IV heparin and transffered to [facility]. Prior to transfer decompensated from [place] o2 to 100% face mask (? 15L via NR[facility]). Had no  prololnged travel, injury or surgery. Patient was treated with catheter directed from thrombolysis. DVT positive on right -popliteal.  He was started on Xarelto . Recommended Hematology referral for unprovoked PE . And Cardiology follow up for possible PFO .  Since discharge. Patient is feeling much improved with resolved shortness of breath. He says he has returned back to his normal activity. He denies any chest pain, hemoptysis, orthopnea, PND or leg swelling. Patient was  found to have a new diagnosis of diabetes during his hospitalization. He was started on a insulin regimen and has been referred to a primary care physician.  There was also some concern for possible underlying sleep apnea as patient has significant snoring and reported daytime sleepiness. We discussed this , he feels this is is much improved since discharge.  Review of Systems Constitutional:   No  weight loss, night sweats,  Fevers, chills, fatigue, or  lassitude.  HEENT:   No headaches,  Difficulty swallowing,  Tooth/dental problems, or  Sore throat,                No sneezing, itching, ear ache, nasal congestion, post nasal drip,   CV:  No chest pain,  Orthopnea, PND, swelling in lower extremities, anasarca, dizziness, palpitations, syncope.   GI  No heartburn, indigestion, abdominal pain, nausea, vomiting, diarrhea, change in bowel habits, loss of appetite, bloody stools.   Resp: No shortness of breath with exertion or at rest.  No excess mucus, no productive cough,  No non-productive cough,  No coughing up of blood.  No change in color of mucus.  No wheezing.  No chest wall deformity  Skin: no rash or lesions.  GU: no dysuria, change in color of urine, no urgency or frequency.  No flank pain, no hematuria   MS:  No joint pain or swelling.  No decreased range of motion.  No back pain.  Psych:  No change in mood or affect. No depression or anxiety.  No memory loss.  Objective:   Physical Exam GEN: A/Ox3; pleasant , NAD, well nourished   HEENT:  [place]/AT,  EACs-clear, TMs-wnl, NOSE-clear, THROAT-clear, no lesions, no postnasal drip or exudate noted.   NECK:  Supple w/ fair ROM; no JVD; normal carotid impulses w/o bruits; no thyromegaly or nodules palpated; no lymphadenopathy.  RESP  Clear  P & A; w/o, wheezes/ rales/ or rhonchi.no accessory muscle use, no dullness to percussion  CARD:  RRR, no m/r/g  , no peripheral edema, pulses intact, no cyanosis or clubbing.  GI:    Soft & nt; nml bowel sounds; no organomegaly or masses detected.  Musco: Warm bil, no deformities or joint swelling noted.   Neuro: alert, no focal deficits noted.    Skin: Warm, no lesions or rashes         Assessment & Plan:

## 2014-04-20 NOTE — Assessment & Plan Note (Signed)
Possible underlying sleep disorder Patient is at risk for underlying sleep apnea with snoring, daytime sleepiness, and morbid obesity Patient education given Patient says he will consider sleep study. However, would like to wait until next visit to discuss all of his details.

## 2014-04-22 NOTE — Progress Notes (Signed)
Reviewed & agree with plan  

## 2014-04-24 ENCOUNTER — Encounter: Payer: Managed Care, Other (non HMO) | Attending: Pulmonary Disease | Admitting: *Deleted

## 2014-04-24 ENCOUNTER — Encounter: Payer: Self-pay | Admitting: *Deleted

## 2014-04-24 VITALS — Ht 72.5 in | Wt 327.8 lb

## 2014-04-24 DIAGNOSIS — Z794 Long term (current) use of insulin: Secondary | ICD-10-CM | POA: Diagnosis not present

## 2014-04-24 DIAGNOSIS — Z713 Dietary counseling and surveillance: Secondary | ICD-10-CM | POA: Insufficient documentation

## 2014-04-24 DIAGNOSIS — E118 Type 2 diabetes mellitus with unspecified complications: Secondary | ICD-10-CM | POA: Insufficient documentation

## 2014-04-24 NOTE — Progress Notes (Signed)
Diabetes Self-Management Education  Visit Type:  Initial  Appt. Start Time: 0730 Appt. End Time: 0900  04/24/2014  Jordan Cline, identified by name and date of birth, is a 39 y.o. male with a diagnosis of Diabetes: Type 2.  Other people present during visit:  Patient   ASSESSMENT  Height 6' 0.5" (1.842 m), weight 327 lb 12.8 oz (148.689 kg). Body mass index is 43.82 kg/(m^2).  Initial Visit Information:  Are you currently following a meal plan?: Yes What type of meal plan do you follow?: healthier options now Are you taking your medications as prescribed?: Yes Are you checking your feet?: Yes How many days per week are you checking your feet?: 7 How often do you need to have someone help you when you read instructions, pamphlets, or other written materials from your doctor or pharmacy?: 1 - Never    Psychosocial:     Patient Belief/Attitude about Diabetes: Motivated to manage diabetes Self-care barriers: None Self-management support: Family (fiancee is supportive) Other persons present: Patient Patient Concerns: Nutrition/Meal planning Preferred Learning Style: Auditory Learning Readiness: Change in progress  Complications:   Last HgB A1C per patient/outside source: 13 % How often do you check your blood sugar?: 1-2 times/day Fasting Blood glucose range (mg/dL): [phone] Number of hypoglycemic episodes per month: 0 Have you had a dilated eye exam in the past 12 months?: Yes Have you had a dental exam in the past 12 months?: Yes  Diet Intake:  Breakfast: cereal with 1% milk, OR eggs with [place] bacon, whole wheat toast with butter, water instead of fruit juice now Snack (morning): bag of almonds Lunch: brings or buys in cafeteria at work, salad occasionally with fresh fruit, water Snack (afternoon): light popcorn Presenter, broadcastingoccasionlly Dinner: cooks at home: [employer] meat, vegetables and maybe brown rice, diet iced tea Snack (evening): not usually Beverage(s): water, coffee  with creamer and sweetener, diet soda or tea  Exercise:  Exercise: Moderate (swimming / aerobic walking) (gym, weights, cardio with elyptical or treadmill 2 days a week) Moderate Exercise amount of time (min / week): 120  Individualized Plan for Diabetes Self-Management Training:   Learning Objective:  Patient will have a greater understanding of diabetes self-management.  Patient education plan per assessed needs and concerns is to attend individual sessions for     Education Topics Reviewed with Patient Today:    Role of diet in the treatment of diabetes and the relationship between the three main macronutrients and blood glucose level, Food label reading, portion sizes and measuring food., Carbohydrate counting, Reviewed blood glucose goals for pre and post meals and how to evaluate the patients' food intake on their blood glucose level. Role of exercise on diabetes management, blood pressure control and cardiac health. Reviewed patients medication for diabetes, action, purpose, timing of dose and side effects. Purpose and frequency of SMBG., Identified appropriate SMBG and/or A1C goals. Taught treatment of hypoglycemia - the 15 rule. Relationship between chronic complications and blood glucose control Role of stress on diabetes      PATIENTS GOALS/Plan (Developed by the patient):  Nutrition: Follow meal plan discussed Physical Activity: Exercise 1-2 times per week Medications: take my medication as prescribed Monitoring : test blood glucose pre and post meals as discussed Reducing Risk: treat hypoglycemia with 15 grams of carbs if blood glucose less than 70mg /dL  Plan:   Patient Instructions  Plan:  Aim for 4 Carb Choices per meal (60 grams) +/- 1 either way  Aim for 0-2 Carbs per  snack if hungry  Include protein in moderation with your meals and snacks Consider reading food labels for Total Carbohydrate of foods Continue  increasing your activity level as  tolerated Consider checking BG at alternate times per day   Continue taking medication insulin as directed by MD      Expected Outcomes:  Demonstrated interest in learning. Expect positive outcomes  Education material provided: Living Well with Diabetes, Food label handouts, A1C conversion sheet, Meal plan card and Carbohydrate counting sheet  If problems or questions, patient to contact team via:  Phone and Email  Future DSME appointment: PRN

## 2014-04-24 NOTE — Patient Instructions (Signed)
Plan:  Aim for 4 Carb Choices per meal (60 grams) +/- 1 either way  Aim for 0-2 Carbs per snack if hungry  Include protein in moderation with your meals and snacks Consider reading food labels for Total Carbohydrate of foods Continue  increasing your activity level as tolerated Consider checking BG at alternate times per day   Continue taking medication insulin as directed by MD

## 2014-04-29 ENCOUNTER — Other Ambulatory Visit: Payer: Self-pay | Admitting: Pulmonary Disease

## 2014-04-30 ENCOUNTER — Other Ambulatory Visit: Payer: Self-pay | Admitting: Pulmonary Disease

## 2014-05-04 DIAGNOSIS — G4730 Sleep apnea, unspecified: Secondary | ICD-10-CM | POA: Diagnosis not present

## 2014-05-05 ENCOUNTER — Telehealth: Payer: Self-pay | Admitting: Pulmonary Disease

## 2014-05-05 DIAGNOSIS — G4733 Obstructive sleep apnea (adult) (pediatric): Secondary | ICD-10-CM

## 2014-05-05 NOTE — Telephone Encounter (Signed)
Home study showed severe OSA stopped breathing 85 times/h He will need CPAP therapy CPAP titration study

## 2014-05-06 DIAGNOSIS — G4730 Sleep apnea, unspecified: Secondary | ICD-10-CM | POA: Diagnosis not present

## 2014-05-07 NOTE — Telephone Encounter (Signed)
LMTCB

## 2014-05-07 NOTE — Telephone Encounter (Signed)
Pt is aware of sleep study results. Order will be placed for CPAP titration study.

## 2014-05-07 NOTE — Telephone Encounter (Signed)
lmtcb

## 2014-05-11 ENCOUNTER — Other Ambulatory Visit: Payer: Self-pay | Admitting: *Deleted

## 2014-05-11 DIAGNOSIS — G4733 Obstructive sleep apnea (adult) (pediatric): Secondary | ICD-10-CM

## 2014-05-12 ENCOUNTER — Encounter: Payer: Self-pay | Admitting: Cardiovascular Disease

## 2014-05-12 ENCOUNTER — Ambulatory Visit (INDEPENDENT_AMBULATORY_CARE_PROVIDER_SITE_OTHER): Payer: Managed Care, Other (non HMO) | Admitting: Cardiovascular Disease

## 2014-05-12 VITALS — BP 136/94 | HR 98 | Ht 72.5 in | Wt 335.4 lb

## 2014-05-12 DIAGNOSIS — Q211 Atrial septal defect: Secondary | ICD-10-CM | POA: Diagnosis not present

## 2014-05-12 DIAGNOSIS — I2602 Saddle embolus of pulmonary artery with acute cor pulmonale: Secondary | ICD-10-CM

## 2014-05-12 DIAGNOSIS — Q2112 Patent foramen ovale: Secondary | ICD-10-CM

## 2014-05-12 NOTE — Patient Instructions (Signed)
Your physician recommends that you continue on your current medications as directed. Please refer to the Current Medication list given to you today.  Your physician wants you to follow-up in: 6 months with Dr. Nahser.  You will receive a reminder letter in the mail two months in advance. If you don't receive a letter, please call our office to schedule the follow-up appointment.  

## 2014-05-12 NOTE — Progress Notes (Signed)
Cardiology Office Note   Date:  05/12/2014   ID:  Jordan Cline, DOB 1975-07-28, MRN [id number]  PCP:  No primary care provider on file.  Cardiologist:   Dammon Makarewicz, Wonda Cheng, MD   Chief Complaint  Patient presents with  . Shortness of Breath   Problem list: 1. Pulmonary list 2. Patent foramen ovale 3. Diabetes mellitus 4. Morbid obesity.  5. Obstructive sleep apnea   History of Present Illness: Jordan Cline is a 39 y.o. male who presents for possible PFO - diagnosed incidentally when he presented with a pulmonary blister the hospital.  Jordan Cline presented to the hospital in February with cough and severe palpitations associated with severe dyspnea  He thought that he had bronchitis.   He went to the ER. He had a CT angiogram which revealed: IMPRESSION: Saddle embolus would large emboli extending into both lower lobes. Emboli also noted in both upper lobes. Possible early lingular pulmonary infarct.  Elevated RV to LV ratio suggest right heart strain and (at least) sub massive pulmonary embolus.  The following day he had catheter guided thrombin lysis. He feels quite a bit better after this procedure.  During that workup, he had an echocardiogram which showed right ventricular enlargement. It also showed flow across the atrial septum and a positive bubble study with right to left shunting of bubble  Contrast.  He was also diagnosed with diabetes mellitus during that hospitalization. He was diagnosed as prediabetic several years ago and was started on metformin. He discontinued the medication and has not been on anything for quite some time. He was just started on insulin last month.  He is now exercising regularly. Glucose levels are ok.   He still feels well.    Past Medical History  Diagnosis Date  . Diabetes mellitus type 2 in obese 03/2014    HgbA1c 13.0  . Borderline hypertension   . 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 Acute deep vein thrombosis (DVT) of popliteal vein of right lower extremity 03/2014    No past surgical history on file.   Current Outpatient Prescriptions  Medication Sig Dispense Refill  . blood glucose meter kit and supplies KIT Dispense based on patient and insurance preference. Use up to four times daily as directed. (FOR ICD-9 250.00, 250.01). 1 each 0  . insulin aspart (NOVOLOG FLEXPEN) 100 UNIT/ML FlexPen Inject 4 Units into the skin 3 (three) times daily with meals. 15 mL 11  . Insulin Glargine (LANTUS SOLOSTAR) 100 UNIT/ML Solostar Pen Inject 20 Units into the skin daily at 10 pm. 15 mL 11  . Insulin Pen Needle (B-D UF III MINI PEN NEEDLES) 31G X 5 MM MISC 1 Units by Does not apply route once. 30 each 0  . XARELTO 20 MG TABS tablet TAKE 1 TABLET BY MOUTH EVERY DAY WITH SUPPER 30 tablet 0   No current facility-administered medications for this visit.    Allergies:   Review of patient's allergies indicates no known allergies.    Social History:  The patient  reports that he quit smoking about 2 months ago. His smoking use included Cigarettes. He has a 6 pack-year smoking history. He does not have any smokeless tobacco history on file. He reports that he drinks alcohol.   Family History:  The patient's family history includes Arrhythmia in his mother.    ROS:  Please see the history of present illness.  Review of Systems: Constitutional:  denies fever, chills, diaphoresis, appetite change and fatigue.  HEENT: denies photophobia, eye pain, redness, hearing loss, ear pain, congestion, sore throat, rhinorrhea, sneezing, neck pain, neck stiffness and tinnitus.  Respiratory: denies SOB, DOE, cough, chest tightness, and wheezing.  Cardiovascular: denies chest pain, palpitations and leg swelling.  Gastrointestinal: denies nausea, vomiting, abdominal pain, diarrhea, constipation, blood in stool.  Genitourinary: denies dysuria, urgency, frequency, hematuria, flank pain and  difficulty urinating.  Musculoskeletal: denies  myalgias, back pain, joint swelling, arthralgias and gait problem.   Skin: denies pallor, rash and wound.  Neurological: denies dizziness, seizures, syncope, weakness, light-headedness, numbness and headaches.   Hematological: denies adenopathy, easy bruising, personal or family bleeding history.  Psychiatric/ Behavioral: denies suicidal ideation, mood changes, confusion, nervousness, sleep disturbance and agitation.       All other systems are reviewed and negative.    PHYSICAL EXAM: VS:  BP 136/94 mmHg  Pulse 98  Ht 6' 0.5" (1.842 m)  Wt 335 lb 6.4 oz (152.136 kg)  BMI 44.84 kg/m2  SpO2 96% , BMI Body mass index is 44.84 kg/(m^2). GEN: Well nourished, well developed, in no acute distress HEENT: normal Neck: no JVD, carotid bruits, or masses Cardiac: RRR; no murmurs, rubs, or gallops,no edema  Respiratory:  clear to auscultation bilaterally, normal work of breathing GI: soft, nontender, nondistended, + BS MS: no deformity or atrophy Skin: warm and dry, no rash Neuro:  Strength and sensation are intact Psych: normal   EKG:  EKG is not ordered today.    Recent Labs: 03/16/2014: ALT 28; B Natriuretic Peptide 14.8 03/20/2014: BUN 7; Creatinine 0.89; Magnesium 1.7; Potassium 3.5; Sodium 137 03/21/2014: Hemoglobin 12.3*; Platelets 237    Lipid Panel No results found for: CHOL, TRIG, HDL, CHOLHDL, VLDL, LDLCALC, LDLDIRECT    Wt Readings from Last 3 Encounters:  05/12/14 335 lb 6.4 oz (152.136 kg)  04/24/14 327 lb 12.8 oz (148.689 kg)  04/20/14 335 lb 9.6 oz (152.227 kg)      Other studies Reviewed: Additional studies/ records that were reviewed today include: . Review of the above records demonstrates:    ASSESSMENT AND PLAN:  1.  Patent foramen ovale: The patient was instructed to have a patent foraminal valley during the time of this pulmonary list. Had significant right to left shunting of bubble contrast.  I suspect  that the significant right to left shunting of contrast was due to the elevated right-sided pressures which were due to his acute pulmonary loss. He also has a history of some obstructive sleep apnea and may also have hypoventilation related to obesity. I think that he will improve significant with a plan of weight loss, treatment of his obstructive sleep apnea, and continued treatment of his pulmonary and was. We discussed doing a transesophageal echo. He would like to wait and see how he does so that 6 months and I would agree that there is no acute indication to do a transesophageal echo since he's on Xarelto and is protected from having a stroke. The echocardiogram did not suggest that this was an ASD. He did have some right ventricular enlargement but it did not appear to be consistent with an ASD. I'll see him again in 6 months for follow-up visit.   Current medicines are reviewed at length with the patient today.  The patient does not have concerns regarding medicines.  The following changes have been made:  no change  Labs/ tests ordered today include:  No orders of  the defined types were placed in this encounter.     Disposition:   FU with me in 6 months     Signed, Meyer Dockery, Wonda Cheng, MD  05/12/2014 11:21 AM    [facility] [address], [place], [place]  [address] Phone: [phone]; Fax: [phone]

## 2014-05-13 ENCOUNTER — Encounter: Payer: Self-pay | Admitting: Family

## 2014-05-13 ENCOUNTER — Ambulatory Visit (INDEPENDENT_AMBULATORY_CARE_PROVIDER_SITE_OTHER): Payer: Managed Care, Other (non HMO) | Admitting: Family

## 2014-05-13 VITALS — BP 120/74 | HR 96 | Temp 98.6°F | Resp 18 | Ht 72.0 in | Wt 333.0 lb

## 2014-05-13 DIAGNOSIS — I2699 Other pulmonary embolism without acute cor pulmonale: Secondary | ICD-10-CM

## 2014-05-13 DIAGNOSIS — G4730 Sleep apnea, unspecified: Secondary | ICD-10-CM | POA: Diagnosis not present

## 2014-05-13 DIAGNOSIS — E119 Type 2 diabetes mellitus without complications: Secondary | ICD-10-CM | POA: Diagnosis not present

## 2014-05-13 DIAGNOSIS — G4733 Obstructive sleep apnea (adult) (pediatric): Secondary | ICD-10-CM | POA: Insufficient documentation

## 2014-05-13 NOTE — Progress Notes (Signed)
Pre visit review using our clinic review tool, if applicable. No additional management support is needed unless otherwise documented below in the visit note. 

## 2014-05-13 NOTE — Patient Instructions (Addendum)
Thank you for choosing [facility].  Summary/Instructions:  Your prescription(s) have been submitted to your pharmacy or been printed and provided for you. Please take as directed and contact our office if you believe you are having problem(s) with the medication(s) or have any questions.  If your symptoms worsen or fail to improve, please contact our office for further instruction, or in case of emergency go directly to the emergency room at the closest medical facility.   Please schedule a time for your physical.

## 2014-05-13 NOTE — Assessment & Plan Note (Signed)
Stable with no evidence of exacerbation. Maintained on Xarelto. Continue current dosage of Xarelto.

## 2014-05-13 NOTE — Assessment & Plan Note (Signed)
Stable with current regimen. Diabetic foot exam completed today which was normal. Patient is due for diabetic eye exam which she will schedule independently. Continue current dosages of Lantus and NovoLog. Follow up in 1 month for A1c recheck.

## 2014-05-13 NOTE — Assessment & Plan Note (Signed)
Stable and awaiting CPAP adjustment study in June. Follow up with sleep medicine as needed.

## 2014-05-13 NOTE — Progress Notes (Addendum)
   Subjective:    Patient ID: Jordan Cline, male    DOB: 1975/12/11, 39 y.o.   MRN: [id number]  Chief Complaint  Patient presents with  . Establish Care  . Diabetes    HPI:  Jordan Cline is a 39 y.o. male who presents today to establish care and discuss his medical conditions.   1) Diabetes - Diagnosed 4 years ago as pre-diabetic and was taking metformin at the time. Was found to have a1c of 13 when in the hospital. Currently taking Lantus and Novolog. Morning blood sugars are running around 115-120. Postpradinal around 109. Reports decreases in thirst, hunger and urination.   Lab Results  Component Value Date   HGBA1C 13.0* 03/18/2014    2) DVT - In February was noted to have increased heart rate and difficulty breathing. He though initially he was having a heart attack. He was seen in the Emergency Room and was determined to have a pulmonary embolism.    3) Sleep Apnea - completed sleep study and is awaiting sleep study adjustment for his CPAP.   No Known Allergies   Current Outpatient Prescriptions on File Prior to Visit  Medication Sig Dispense Refill  . blood glucose meter kit and supplies KIT Dispense based on patient and insurance preference. Use up to four times daily as directed. (FOR ICD-9 250.00, 250.01). 1 each 0  . insulin aspart (NOVOLOG FLEXPEN) 100 UNIT/ML FlexPen Inject 4 Units into the skin 3 (three) times daily with meals. 15 mL 11  . Insulin Glargine (LANTUS SOLOSTAR) 100 UNIT/ML Solostar Pen Inject 20 Units into the skin daily at 10 pm. 15 mL 11  . Insulin Pen Needle (B-D UF III MINI PEN NEEDLES) 31G X 5 MM MISC 1 Units by Does not apply route once. 30 each 0  . XARELTO 20 MG TABS tablet TAKE 1 TABLET BY MOUTH EVERY DAY WITH SUPPER 30 tablet 0   No current facility-administered medications on file prior to visit.    Review of Systems  Respiratory: Negative for cough, chest tightness and shortness of breath.   Cardiovascular: Negative for  chest pain, palpitations and leg swelling.  Psychiatric/Behavioral: Negative for sleep disturbance.      Objective:    BP 120/74 mmHg  Pulse 96  Temp(Src) 98.6 F (37 C) (Oral)  Resp 18  Ht 6' (1.829 m)  Wt 333 lb (151.048 kg)  BMI 45.15 kg/m2  SpO2 95% Nursing note and vital signs reviewed.  Physical Exam  Constitutional: He is oriented to person, place, and time. He appears well-developed and well-nourished. No distress.  Cardiovascular: Normal rate, regular rhythm, normal heart sounds and intact distal pulses.   Pulmonary/Chest: Effort normal and breath sounds normal.  Neurological: He is alert and oriented to person, place, and time.  Diabetic foot exam - bilateral feet are free from skin breakdown, cuts, and abrasions. Toenails are neatly trimmed. Pulses are intact and appropriate. Sensation is intact to monofilament bilaterally.  Skin: Skin is warm and dry.  Psychiatric: He has a normal mood and affect. His behavior is normal. Judgment and thought content normal.       Assessment & Plan:

## 2014-05-22 ENCOUNTER — Ambulatory Visit (INDEPENDENT_AMBULATORY_CARE_PROVIDER_SITE_OTHER): Payer: Managed Care, Other (non HMO) | Admitting: Adult Health

## 2014-05-22 ENCOUNTER — Encounter: Payer: Self-pay | Admitting: Adult Health

## 2014-05-22 VITALS — BP 124/84 | HR 85 | Temp 97.8°F | Ht 72.5 in | Wt 331.0 lb

## 2014-05-22 DIAGNOSIS — R05 Cough: Secondary | ICD-10-CM | POA: Diagnosis not present

## 2014-05-22 DIAGNOSIS — G4730 Sleep apnea, unspecified: Secondary | ICD-10-CM | POA: Diagnosis not present

## 2014-05-22 DIAGNOSIS — J209 Acute bronchitis, unspecified: Secondary | ICD-10-CM

## 2014-05-22 DIAGNOSIS — R059 Cough, unspecified: Secondary | ICD-10-CM

## 2014-05-22 MED ORDER — LEVALBUTEROL HCL 0.63 MG/3ML IN NEBU
0.6300 mg | INHALATION_SOLUTION | Freq: Once | RESPIRATORY_TRACT | Status: AC
  Administered 2014-05-22: 0.63 mg via RESPIRATORY_TRACT

## 2014-05-22 MED ORDER — AZITHROMYCIN 250 MG PO TABS: ORAL_TABLET | ORAL | Status: AC

## 2014-05-22 NOTE — Patient Instructions (Signed)
Zpack take as directed . Mucinex DM Twice daily  As needed  Cough/congestion .  Fluids and rest.  We will set up CPAP At bedtime  With auto titration  Download in 1 month .  Follow up Dr. Vassie LollAlva  In 6 weeks and As needed   Please contact office for sooner follow up if symptoms do not improve or worsen or seek emergency care

## 2014-05-26 DIAGNOSIS — J209 Acute bronchitis, unspecified: Secondary | ICD-10-CM | POA: Insufficient documentation

## 2014-05-26 NOTE — Progress Notes (Signed)
Subjective:    Patient ID: Jordan Cline, male    DOB: Jun 21, 1975, 39 y.o.   MRN: [id number]  HPI 39 yo male , previous smoker, admitted with acute shortness of breath found to have a submassive PE with RV strain on CT angiogram, right DVT. Patient was started on heparin . However, was unstable and underwent catheter directed thrombolysis. Discharged on Xarelto.  Echo showed .Mild LVH, grade 1 diastolic dysfunction, RV moderately dilated with decreased systolic function. ? Bubble study difficult , possibly positive suggesting PFO.   04/20/14  Post Hospital follow up  Patient returns for a post hospital follow-up Patient was admitted February 1 to February 6 for saddle pulmonary embolus with acute cor pulmonale requiring catheter directed thrombolysis.  Pt was presented with acute dyspnea and near syncope. He is a  smoker sedenatary life style (sits > 8h per day), grandmother possibly sufered PE. PTA-Last few weeks has been having dyspnea, cough and passed it off as bronchitis. Some worse this past week along with gerd and atypical chest pain. On 03/16/14 upon entering car after leaving office building became very dyspneic, light headed and felt like passing out. [facility] - and submassive PE with RV strain on CT angio confirmed (duplex LE data not available). Initial troponin normal. Started on IV heparin and transffered to [facility]. Prior to transfer decompensated from [place] o2 to 100% face mask (? 15L via NRB). Had no  prololnged travel, injury or surgery. Patient was treated with catheter directed from thrombolysis. DVT positive on right -popliteal.  He was started on Xarelto . Recommended Hematology referral for unprovoked PE . And Cardiology follow up for possible PFO .  Since discharge. Patient is feeling much improved with resolved shortness of breath. He says he has returned back to his normal activity. He denies any chest pain, hemoptysis, orthopnea, PND or leg swelling. Patient  was found to have a new diagnosis of diabetes during his hospitalization. He was started on a insulin regimen and has been referred to a primary care physician.  There was also some concern for possible underlying sleep apnea as patient has significant snoring and reported daytime sleepiness. We discussed this , he feels this is is much improved since discharge.    05/22/14 Acute OV  Patient presents for an acute office visit. Complains of chest congestion, prod cough with pink/clear mucus at onset that is now yellow,  Denies any hemoptysis, orthopnea, PND, or increased leg swelling He has been found to have severe sleep apnea and has been set up for C-peptide titration study. However , there are no appointments until May. We discussed beginning a C Pap trial at home with auto titration. Patient is in agreement.    Review of Systems Constitutional:   No  weight loss, night sweats,   + fatigue, or  lassitude.  HEENT:   No headaches,  Difficulty swallowing,  Tooth/dental problems, or  Sore throat,                No sneezing, itching, ear ache,  +nasal congestion, post nasal drip,   CV:  No chest pain,  Orthopnea, PND, swelling in lower extremities, anasarca, dizziness, palpitations, syncope.   GI  No heartburn, indigestion, abdominal pain, nausea, vomiting, diarrhea, change in bowel habits, loss of appetite, bloody stools.   Resp:   No chest wall deformity  Skin: no rash or lesions.  GU: no dysuria, change in color of urine, no urgency or frequency.  No flank pain, no hematuria   MS:  No joint pain or swelling.  No decreased range of motion.  No back pain.  Psych:  No change in mood or affect. No depression or anxiety.  No memory loss.         Objective:   Physical Exam GEN: A/Ox3; pleasant , NAD, well nourished   HEENT:  [place]/AT,  EACs-clear, TMs-wnl, NOSE-clear, THROAT-clear, no lesions, no postnasal drip or exudate noted.   NECK:  Supple w/ fair ROM; no JVD; normal carotid  impulses w/o bruits; no thyromegaly or nodules palpated; no lymphadenopathy.  RESP  Clear  P & A; w/o, wheezes/ rales/ or rhonchi.no accessory muscle use, no dullness to percussion  CARD:  RRR, no m/r/g  , no peripheral edema, pulses intact, no cyanosis or clubbing.  GI:   Soft & nt; nml bowel sounds; no organomegaly or masses detected.  Musco: Warm bil, no deformities or joint swelling noted.   Neuro: alert, no focal deficits noted.    Skin: Warm, no lesions or rashes         Assessment & Plan:

## 2014-05-26 NOTE — Assessment & Plan Note (Signed)
We will set up CPAP At bedtime  With auto titration  Download in 1 month .  Follow up Dr. Vassie LollAlva  In 6 weeks and As needed   Please contact office for sooner follow up if symptoms do not improve or worsen or seek emergency care

## 2014-05-26 NOTE — Assessment & Plan Note (Signed)
Zpack take as directed . Mucinex DM Twice daily  As needed  Cough/congestion .  Fluids and rest.  Follow up Dr. Vassie LollAlva  In 6 weeks and As needed   Please contact office for sooner follow up if symptoms do not improve or worsen or seek emergency care

## 2014-05-29 ENCOUNTER — Other Ambulatory Visit: Payer: Self-pay | Admitting: Pulmonary Disease

## 2014-07-02 ENCOUNTER — Other Ambulatory Visit: Payer: Self-pay | Admitting: Pulmonary Disease

## 2014-07-08 ENCOUNTER — Ambulatory Visit: Payer: Managed Care, Other (non HMO) | Admitting: Pulmonary Disease

## 2014-07-14 ENCOUNTER — Encounter (HOSPITAL_BASED_OUTPATIENT_CLINIC_OR_DEPARTMENT_OTHER): Payer: Managed Care, Other (non HMO)

## 2014-07-21 ENCOUNTER — Encounter: Payer: Managed Care, Other (non HMO) | Admitting: Oncology

## 2014-07-27 ENCOUNTER — Other Ambulatory Visit: Payer: Self-pay | Admitting: Pulmonary Disease

## 2014-07-27 NOTE — Telephone Encounter (Signed)
Pt requesting Zarelto 20 mg refill Last office visit with TP 05/22/14 Patient was to f/u Alva in 6 weeks 07/08/14 appt patient NO SHOW Are will refilling medication?

## 2014-07-28 ENCOUNTER — Telehealth: Payer: Self-pay | Admitting: *Deleted

## 2014-07-28 NOTE — Telephone Encounter (Signed)
He needs FU ASAP with me or TP OK to refill x1 week

## 2014-07-28 NOTE — Telephone Encounter (Signed)
Patient had a1c done on 05/13/2014

## 2014-07-29 NOTE — Telephone Encounter (Signed)
OK after

## 2014-08-10 ENCOUNTER — Encounter (HOSPITAL_BASED_OUTPATIENT_CLINIC_OR_DEPARTMENT_OTHER): Payer: Managed Care, Other (non HMO)

## 2014-08-18 ENCOUNTER — Telehealth: Payer: Self-pay | Admitting: Family

## 2014-08-18 MED ORDER — INSULIN PEN NEEDLE 31G X 5 MM MISC: 1.0000 [IU] | Freq: Once | Status: DC

## 2014-08-18 NOTE — Telephone Encounter (Signed)
Needles sent. LVM letting pt know

## 2014-08-18 NOTE — Telephone Encounter (Signed)
Patient need refill of his pen needles for Novalog and Lantus.

## 2014-09-08 ENCOUNTER — Other Ambulatory Visit: Payer: Self-pay | Admitting: Pulmonary Disease

## 2014-09-11 ENCOUNTER — Encounter (HOSPITAL_BASED_OUTPATIENT_CLINIC_OR_DEPARTMENT_OTHER): Payer: Managed Care, Other (non HMO)

## 2014-09-22 ENCOUNTER — Encounter: Payer: Self-pay | Admitting: Family

## 2014-09-22 ENCOUNTER — Ambulatory Visit (INDEPENDENT_AMBULATORY_CARE_PROVIDER_SITE_OTHER): Payer: Managed Care, Other (non HMO) | Admitting: Family

## 2014-09-22 ENCOUNTER — Other Ambulatory Visit (INDEPENDENT_AMBULATORY_CARE_PROVIDER_SITE_OTHER): Payer: Managed Care, Other (non HMO)

## 2014-09-22 VITALS — BP 140/82 | HR 91 | Temp 98.6°F | Resp 18 | Ht 72.05 in | Wt 345.0 lb

## 2014-09-22 DIAGNOSIS — E119 Type 2 diabetes mellitus without complications: Secondary | ICD-10-CM

## 2014-09-22 DIAGNOSIS — E1169 Type 2 diabetes mellitus with other specified complication: Secondary | ICD-10-CM

## 2014-09-22 DIAGNOSIS — N521 Erectile dysfunction due to diseases classified elsewhere: Secondary | ICD-10-CM | POA: Diagnosis not present

## 2014-09-22 LAB — HEMOGLOBIN A1C: HEMOGLOBIN A1C: 7.2 % — AB (ref 4.6–6.5)

## 2014-09-22 LAB — MICROALBUMIN / CREATININE URINE RATIO
Creatinine,U: 154.5 mg/dL
MICROALB/CREAT RATIO: 14.4 mg/g (ref 0.0–30.0)
Microalb, Ur: 22.3 mg/dL — ABNORMAL HIGH (ref 0.0–1.9)

## 2014-09-22 MED ORDER — TADALAFIL 10 MG PO TABS: 5.0000 mg | ORAL_TABLET | Freq: Every day | ORAL | Status: DC | PRN

## 2014-09-22 NOTE — Assessment & Plan Note (Signed)
Diabetes appears to have improved control with current regimen based on home readings. Has been compliant with medication regimen. Obtain A1c and urine microalbumin. Diabetic eye exam is scheduled for end of the month. Continue current dosage of Lantus and Novolog. Follow up and changes pending A1c results.

## 2014-09-22 NOTE — Patient Instructions (Addendum)
Thank you for choosing [facility].  Summary/Instructions:  Your prescription(s) have been submitted to your pharmacy or been printed and provided for you. Please take as directed and contact our office if you believe you are having problem(s) with the medication(s) or have any questions.  Please stop by the lab on the basement level of the building for your blood work. Your results will be released to MyChart (or called to you) after review, usually within 72 hours after test completion. If any changes need to be made, you will be notified at that same time.  If your symptoms worsen or fail to improve, please contact our office for further instruction, or in case of emergency go directly to the emergency room at the closest medical facility.   Tadalafil tablets (Cialis) What is this medicine? TADALAFIL (tah DA la fil) is used to treat erection problems in men. It is also used for enlargement of the prostate gland in men, a condition called benign prostatic hyperplasia or BPH. This medicine improves urine flow and reduces BPH symptoms. This medicine can also treat both erection problems and BPH when they occur together. This medicine may be used for other purposes; ask your health care provider or pharmacist if you have questions. COMMON BRAND NAME(S): Cialis What should I tell my health care provider before I take this medicine? They need to know if you have any of these conditions: -bleeding disorders -eye or vision problems, including a rare inherited eye disease called retinitis pigmentosa -anatomical deformation of the penis, Peyronie's disease, or history of priapism (painful and prolonged erection) -heart disease, angina, a history of heart attack, irregular heart beats, or other heart problems -high or low blood pressure -history of blood diseases, like sickle cell anemia or leukemia -history of stomach bleeding -kidney disease -liver disease -stroke -an unusual or allergic  reaction to tadalafil, other medicines, foods, dyes, or preservatives -pregnant or trying to get pregnant -breast-feeding How should I use this medicine? Take this medicine by mouth with a glass of water. Follow the directions on the prescription label. You may take this medicine with or without meals. When this medicine is used for erection problems, your doctor may prescribe it to be taken once daily or as needed. If you are taking the medicine as needed, you may be able to have sexual activity 30 minutes after taking it and for up to 36 hours after taking it. Whether you are taking the medicine as needed or once daily, you should not take more than one dose per day. If you are taking this medicine for symptoms of benign prostatic hyperplasia (BPH) or to treat both BPH and an erection problem, take the dose once daily at about the same time each day. Do not take your medicine more often than directed. Talk to your pediatrician regarding the use of this medicine in children. Special care may be needed. Overdosage: If you think you have taken too much of this medicine contact a poison control center or emergency room at once. NOTE: This medicine is only for you. Do not share this medicine with others. What if I miss a dose? If you are taking this medicine as needed for erection problems, this does not apply. If you miss a dose while taking this medicine once daily for an erection problem, benign prostatic hyperplasia, or both, take it as soon as you remember, but do not take more than one dose per day. What may interact with this medicine? Do not take this  medicine with any of the following medications: -nitrates like amyl nitrite, isosorbide dinitrate, isosorbide mononitrate, nitroglycerin -other medicines for erectile dysfunction like avanafil, sildenafil, vardenafil -other tadalafil products (Adcirca) This medicine may also interact with the following medications: -certain drugs for high blood  pressure -certain drugs for the treatment of HIV infection or AIDS -certain drugs used for fungal or yeast infections, like fluconazole, itraconazole, ketoconazole, and voriconazole -certain drugs used for seizures like carbamazepine, phenytoin, and phenobarbital -grapefruit juice -macrolide antibiotics like clarithromycin, erythromycin, troleandomycin -medicines for prostate problems -rifabutin, rifampin or rifapentine This list may not describe all possible interactions. Give your health care provider a list of all the medicines, herbs, non-prescription drugs, or dietary supplements you use. Also tell them if you smoke, drink alcohol, or use illegal drugs. Some items may interact with your medicine. What should I watch for while using this medicine? If you notice any changes in your vision while taking this drug, call your doctor or health care professional as soon as possible. Stop using this medicine and call your health care provider right away if you have a loss of sight in one or both eyes. Contact your doctor or health care professional right away if the erection lasts longer than 4 hours or if it becomes painful. This may be a sign of serious problem and must be treated right away to prevent permanent damage. If you experience symptoms of nausea, dizziness, chest pain or arm pain upon initiation of sexual activity after taking this medicine, you should refrain from further activity and call your doctor or health care professional as soon as possible. Do not drink alcohol to excess (examples, 5 glasses of wine or 5 shots of whiskey) when taking this medicine. When taken in excess, alcohol can increase your chances of getting a headache or getting dizzy, increasing your heart rate or lowering your blood pressure. Using this medicine does not protect you or your partner against HIV infection (the virus that causes AIDS) or other sexually transmitted diseases. What side effects may I notice from  receiving this medicine? Side effects that you should report to your doctor or health care professional as soon as possible: -allergic reactions like skin rash, itching or hives, swelling of the face, lips, or tongue -breathing problems -changes in hearing -changes in vision -chest pain -fast, irregular heartbeat -prolonged or painful erection -seizures Side effects that usually do not require medical attention (report to your doctor or health care professional if they continue or are bothersome): -back pain -dizziness -flushing -headache -indigestion -muscle aches -nausea -stuffy or runny nose This list may not describe all possible side effects. Call your doctor for medical advice about side effects. You may report side effects to FDA at 1-800-FDA-1088. Where should I keep my medicine? Keep out of the reach of children. Store at room temperature between 15 and 30 degrees C (59 and 86 degrees F). Throw away any unused medicine after the expiration date. NOTE: This sheet is a summary. It may not cover all possible information. If you have questions about this medicine, talk to your doctor, pharmacist, or health care provider.  2015, Elsevier/Gold Standard. (2012-01-31 13:42:23)

## 2014-09-22 NOTE — Assessment & Plan Note (Signed)
Symptoms consistent with erectile dysfunction secondary to Type II diabetes. Start cialis. Discussed risks and benefits of medication including proper usage and when to seek further care. Follow up pending usage of medication.

## 2014-09-22 NOTE — Progress Notes (Signed)
Pre visit review using our clinic review tool, if applicable. No additional management support is needed unless otherwise documented below in the visit note. 

## 2014-09-22 NOTE — Progress Notes (Signed)
Subjective:    Patient ID: Jordan Cline, male    DOB: 1975-03-27, 39 y.o.   MRN: [id number]  Chief Complaint  Patient presents with  . Follow-up    says his sugars have been around 120s and 130s does not check it as often as he should    HPI:  Jordan Cline is a 39 y.o. male with a PMH of pulmonary embolism, PFO, Type 2 diabetes and sleep apnea who presents today for an office follow up.     1.) Diabetes - Previously noted to have an A1c of 13.0 and is currently maintained on Lantus and Novolog.  Takes his medications as prescribed and denies adverse side effects. Takes the medication as prescribed and denies adverse side effects or hypoglycemic events. Takes his blood sugars but not as often as he should and reports average of less than 140. Diabetic eye exam scheduled for the end of this month.   Lab Results  Component Value Date   HGBA1C 7.2* 09/22/2014   2.)  Erectile dysfunction - This is a new problem.  Associated symptom of decreased erection strength has been going on for a couple of months. Denies any modifying factors or treatmetns that make it better or worse. Timing of symptoms waxes and wanes. Described as a softened erection. Would like to know if diabetes effects it and if there is any medication for it.    No Known Allergies  Current Outpatient Prescriptions on File Prior to Visit  Medication Sig Dispense Refill  . blood glucose meter kit and supplies KIT Dispense based on patient and insurance preference. Use up to four times daily as directed. (FOR ICD-9 250.00, 250.01). 1 each 0  . insulin aspart (NOVOLOG FLEXPEN) 100 UNIT/ML FlexPen Inject 4 Units into the skin 3 (three) times daily with meals. 15 mL 11  . Insulin Glargine (LANTUS SOLOSTAR) 100 UNIT/ML Solostar Pen Inject 20 Units into the skin daily at 10 pm. 15 mL 11  . Insulin Pen Needle (B-D UF III MINI PEN NEEDLES) 31G X 5 MM MISC 1 Units by Does not apply route once. 100 each 3  . XARELTO 20 MG  TABS tablet TAKE 1 TABLET BY MOUTH EVERY DAY WITH SUPPER 30 tablet 0   No current facility-administered medications on file prior to visit.    Past Medical History  Diagnosis Date  . Borderline hypertension   . 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 Acute deep vein thrombosis (DVT) of popliteal vein of right lower extremity 03/2014  . Sleep apnea   . Diabetes mellitus type 2 in obese 03/2014    HgbA1c 13.0  . Allergy      Review of Systems  Eyes:       Negative for changes in vision.   Respiratory: Negative for chest tightness and shortness of breath.   Cardiovascular: Negative for chest pain, palpitations and leg swelling.  Endocrine: Negative for polydipsia, polyphagia and polyuria.  Neurological: Negative for headaches.      Objective:    BP 140/82 mmHg  Pulse 91  Temp(Src) 98.6 F (37 C) (Oral)  Resp 18  Ht 6' 0.05" (1.83 m)  Wt 345 lb (156.491 kg)  BMI 46.73 kg/m2  SpO2 97% Nursing note and vital signs reviewed.  Physical Exam  Constitutional: He is oriented to person, place, and time. He appears well-developed and well-nourished. No distress.  Cardiovascular: Normal rate, regular rhythm, normal heart  sounds and intact distal pulses.   Pulmonary/Chest: Effort normal and breath sounds normal.  Neurological: He is alert and oriented to person, place, and time.  Skin: Skin is warm and dry.  Psychiatric: He has a normal mood and affect. His behavior is normal. Judgment and thought content normal.       Assessment & Plan:   Problem List Items Addressed This Visit      Endocrine   Type II diabetes mellitus - Primary    Diabetes appears to have improved control with current regimen based on home readings. Has been compliant with medication regimen. Obtain A1c and urine microalbumin. Diabetic eye exam is scheduled for end of the month. Continue current dosage of Lantus and Novolog. Follow up and changes pending A1c  results.       Relevant Orders   Urine Microalbumin w/creat. ratio (Completed)   Hemoglobin A1c (Completed)   Erectile dysfunction associated with type 2 diabetes mellitus   Relevant Medications   tadalafil (CIALIS) 10 MG tablet

## 2014-10-09 ENCOUNTER — Other Ambulatory Visit: Payer: Self-pay | Admitting: Pulmonary Disease

## 2014-11-08 ENCOUNTER — Other Ambulatory Visit: Payer: Self-pay | Admitting: Pulmonary Disease

## 2014-11-11 ENCOUNTER — Telehealth: Payer: Self-pay | Admitting: Pulmonary Disease

## 2014-11-11 ENCOUNTER — Telehealth: Payer: Self-pay | Admitting: *Deleted

## 2014-11-11 NOTE — Telephone Encounter (Signed)
lmomtcb for pt 

## 2014-11-11 NOTE — Telephone Encounter (Signed)
Left msg on triage stating son hasn't receive lab results from 8/9. Can call pt @ [phone] with results. Called pt no answer LMOM Tammy Sours has sent a email pertaining to lab results, but did relay Tammy Sours response that was on email...Raechel Chute

## 2014-11-12 NOTE — Telephone Encounter (Signed)
lmtcb

## 2014-11-13 NOTE — Telephone Encounter (Signed)
Left message for pt to call back  °

## 2014-11-13 NOTE — Telephone Encounter (Signed)
Pt returning call.Jordan Cline ° °

## 2014-11-13 NOTE — Telephone Encounter (Signed)
lmmtcb x3

## 2014-11-16 NOTE — Telephone Encounter (Signed)
lmtcb for pt.  

## 2014-11-17 NOTE — Telephone Encounter (Signed)
lmtcb x3 for pt. Per triage protocol, message will be closed.  

## 2014-11-24 ENCOUNTER — Ambulatory Visit (HOSPITAL_BASED_OUTPATIENT_CLINIC_OR_DEPARTMENT_OTHER): Payer: Managed Care, Other (non HMO)

## 2014-12-08 ENCOUNTER — Ambulatory Visit (INDEPENDENT_AMBULATORY_CARE_PROVIDER_SITE_OTHER): Payer: Managed Care, Other (non HMO) | Admitting: Adult Health

## 2014-12-08 ENCOUNTER — Encounter: Payer: Self-pay | Admitting: Adult Health

## 2014-12-08 VITALS — BP 136/84 | HR 87 | Temp 98.9°F | Ht 72.0 in | Wt 345.0 lb

## 2014-12-08 DIAGNOSIS — R911 Solitary pulmonary nodule: Secondary | ICD-10-CM

## 2014-12-08 DIAGNOSIS — I2699 Other pulmonary embolism without acute cor pulmonale: Secondary | ICD-10-CM | POA: Diagnosis not present

## 2014-12-08 DIAGNOSIS — G4730 Sleep apnea, unspecified: Secondary | ICD-10-CM

## 2014-12-08 MED ORDER — RIVAROXABAN 20 MG PO TABS: 20.0000 mg | ORAL_TABLET | Freq: Every day | ORAL | Status: DC

## 2014-12-08 NOTE — Progress Notes (Signed)
Subjective:    Patient ID: Jordan Cline, male    DOB: April 19, 1975, 39 y.o.   MRN: [id number]  HPI 39 yo male , previous smoker, admitted 03/16/14  with acute shortness of breath found to have a submassive PE with RV strain on CT angiogram, right DVT. Patient was started on heparin . However, was unstable and underwent catheter directed thrombolysis. Discharged on Xarelto.   TEST :  Echo 03/2014 showed .Mild LVH, grade 1 diastolic dysfunction, RV moderately dilated with decreased systolic function. ? Bubble study difficult , possibly positive suggesting PFO.  CT chest angio 03/2014 >Saddle embolus would large emboli extending into both lower lobes. Emboli also noted in both upper lobes. Possible early lingular  pulmonary infarct. Home sleep study March 2016>  AHI of 85   12/08/2014 Follow up : PE, OSA, Patient returns for a six-month follow-up.  Patient was seen earlier this year after a hospitalization for an acute submassive PE with right-sided heart strain and a right-sided DVT.  He did require catheter directed thrombolysis. Echo did show evidence of right heart strain. He was treated with heparin and discharged on Xarelto. Patient missed his follow-up appointment at our office informing me that he got busy with work. He was also referred to hematology, unfortunately, he missed his hematology appointment.  PE was considered unprovoked. He does have a family history of PE. No hypercoagulable studies were unable to be done during his initial hospitalization. Patient says he's been doing well on Xarelto without any missed doses. He denies any bleeding. Breathing and activity levels have returned to normal. He denies any leg swelling   Patient had a home sleep study and March that showed severe sleep apnea with an AHI of 85. He was started on nocturnal C Pap. He says that he wears it a few nights a week. Falls asleep and forgets to put it on. Download shows poor compliance. We discussed C Pap  compliance and patient education on untreated sleep apnea. Advised on the importance of weight loss and wearing his C Pap every night.     Review of Systems Constitutional:   No  weight loss, night sweats,   + fatigue, or  lassitude.  HEENT:   No headaches,  Difficulty swallowing,  Tooth/dental problems, or  Sore throat,                No sneezing, itching, ear ache, nasal congestion, post nasal drip,   CV:  No chest pain,  Orthopnea, PND, swelling in lower extremities, anasarca, dizziness, palpitations, syncope.   GI  No heartburn, indigestion, abdominal pain, nausea, vomiting, diarrhea, change in bowel habits, loss of appetite, bloody stools.   Resp:   No chest wall deformity  Skin: no rash or lesions.  GU: no dysuria, change in color of urine, no urgency or frequency.  No flank pain, no hematuria   MS:  No joint pain or swelling.  No decreased range of motion.  No back pain.  Psych:  No change in mood or affect. No depression or anxiety.  No memory loss.         Objective:   Physical Exam GEN: A/Ox3; pleasant , NAD, morbidly obese Vital signs reviewed  HEENT:  [place]/AT,  EACs-clear, TMs-wnl, NOSE-clear, THROAT-clear, no lesions, no postnasal drip or exudate noted. Class 2-3 airway  NECK:  Supple w/ fair ROM; no JVD; normal carotid impulses w/o bruits; no thyromegaly or nodules palpated; no lymphadenopathy.  RESP  Clear  P &  A; w/o, wheezes/ rales/ or rhonchi.no accessory muscle use, no dullness to percussion  CARD:  RRR, no m/r/g  , no peripheral edema, pulses intact, no cyanosis or clubbing.  GI:   Soft & nt; nml bowel sounds; no organomegaly or masses detected.  Musco: Warm bil, no deformities or joint swelling noted.   Neuro: alert, no focal deficits noted.    Skin: Warm, no lesions or rashes         Assessment & Plan:

## 2014-12-08 NOTE — Patient Instructions (Addendum)
Continue on Xarelto 20mg  daily  Report any signs of bleeding.  Set up for 2 D echo .  Reschedule Hematology consult .  Try to wear CPAP At bedtime   Do not drive if sleepy.  Goal is at least 4-6 hr each night  Work on weight loss.  Follow up Dr. Vassie LollAlva  In 3-4 months and As needed

## 2014-12-08 NOTE — Addendum Note (Signed)
Addended by: Karalee HeightOX, Abdulaziz Toman P on: 12/08/2014 10:32 AM   Modules accepted: Orders

## 2014-12-08 NOTE — Assessment & Plan Note (Signed)
Poor C Pap compliance Patient education given  Plan Try to wear CPAP At bedtime   Do not drive if sleepy.  Goal is at least 4-6 hr each night  Work on weight loss.  Follow up Dr. Vassie LollAlva  In 3-4 months and As needed

## 2014-12-08 NOTE — Assessment & Plan Note (Signed)
Unprovoked PE and March 2016> Patient appears to be doing well on Xarelto. Patient reportedly missed his hematology referral and will resend this. He is to continue on Xarelto 20 mg daily. We'll repeat his 2-D echo to check to evaluate for previous right heart strain.  Plan Continue on Xarelto 20mg  daily  Report any signs of bleeding.  Set up for 2 D echo .  Reschedule Hematology consult .  Follow up Dr. Vassie LollAlva  In 3-4 months and As needed

## 2014-12-09 NOTE — Progress Notes (Signed)
Reviewed & agree with plan  

## 2014-12-10 ENCOUNTER — Telehealth: Payer: Self-pay | Admitting: Hematology & Oncology

## 2014-12-10 ENCOUNTER — Other Ambulatory Visit: Payer: Self-pay | Admitting: Pulmonary Disease

## 2014-12-10 NOTE — Telephone Encounter (Signed)
Called patient's home # and left message for Pt to rtn my call regarding New Pt. Appt.       [facility].

## 2014-12-13 ENCOUNTER — Encounter (HOSPITAL_COMMUNITY): Payer: Self-pay | Admitting: Emergency Medicine

## 2014-12-13 ENCOUNTER — Emergency Department (HOSPITAL_COMMUNITY): Payer: Managed Care, Other (non HMO)

## 2014-12-13 ENCOUNTER — Emergency Department (HOSPITAL_COMMUNITY)
Admission: EM | Admit: 2014-12-13 | Discharge: 2014-12-13 | Disposition: A | Discharge status: Home / Self Care | Payer: Managed Care, Other (non HMO) | Attending: Emergency Medicine | Admitting: Emergency Medicine

## 2014-12-13 DIAGNOSIS — E669 Obesity, unspecified: Secondary | ICD-10-CM | POA: Insufficient documentation

## 2014-12-13 DIAGNOSIS — R000 Tachycardia, unspecified: Secondary | ICD-10-CM | POA: Diagnosis not present

## 2014-12-13 DIAGNOSIS — Z86711 Personal history of pulmonary embolism: Secondary | ICD-10-CM | POA: Diagnosis not present

## 2014-12-13 DIAGNOSIS — S8991XA Unspecified injury of right lower leg, initial encounter: Secondary | ICD-10-CM | POA: Insufficient documentation

## 2014-12-13 DIAGNOSIS — Y9289 Other specified places as the place of occurrence of the external cause: Secondary | ICD-10-CM | POA: Diagnosis not present

## 2014-12-13 DIAGNOSIS — Z8669 Personal history of other diseases of the nervous system and sense organs: Secondary | ICD-10-CM | POA: Diagnosis not present

## 2014-12-13 DIAGNOSIS — Z794 Long term (current) use of insulin: Secondary | ICD-10-CM | POA: Diagnosis not present

## 2014-12-13 DIAGNOSIS — Z87891 Personal history of nicotine dependence: Secondary | ICD-10-CM | POA: Insufficient documentation

## 2014-12-13 DIAGNOSIS — Z79899 Other long term (current) drug therapy: Secondary | ICD-10-CM | POA: Diagnosis not present

## 2014-12-13 DIAGNOSIS — Y998 Other external cause status: Secondary | ICD-10-CM | POA: Diagnosis not present

## 2014-12-13 DIAGNOSIS — S60222A Contusion of left hand, initial encounter: Secondary | ICD-10-CM | POA: Insufficient documentation

## 2014-12-13 DIAGNOSIS — Y9389 Activity, other specified: Secondary | ICD-10-CM | POA: Insufficient documentation

## 2014-12-13 DIAGNOSIS — Z7901 Long term (current) use of anticoagulants: Secondary | ICD-10-CM | POA: Insufficient documentation

## 2014-12-13 DIAGNOSIS — E119 Type 2 diabetes mellitus without complications: Secondary | ICD-10-CM | POA: Insufficient documentation

## 2014-12-13 DIAGNOSIS — W010XXA Fall on same level from slipping, tripping and stumbling without subsequent striking against object, initial encounter: Secondary | ICD-10-CM | POA: Diagnosis not present

## 2014-12-13 DIAGNOSIS — Z86718 Personal history of other venous thrombosis and embolism: Secondary | ICD-10-CM | POA: Insufficient documentation

## 2014-12-13 DIAGNOSIS — S6992XA Unspecified injury of left wrist, hand and finger(s), initial encounter: Secondary | ICD-10-CM | POA: Diagnosis present

## 2014-12-13 MED ORDER — TRAMADOL HCL 50 MG PO TABS: 50.0000 mg | ORAL_TABLET | Freq: Four times a day (QID) | ORAL | Status: DC | PRN

## 2014-12-13 NOTE — ED Provider Notes (Signed)
By signing my name below, I, Hansel Feinstein, attest that this documentation has been prepared under the direction and in the presence of Ly[place], DO. Electronically Signed: Hansel Feinstein, ED Scribe. 12/13/2014. 3:55 AM.   TIME SEEN: 3:53 AM   CHIEF COMPLAINT:  Chief Complaint  Patient presents with  . Hand Injury    HPI: HPI Comments: Jordan Cline is a 39 y.o. male who is right-hand-dominant, presents to the Emergency Department complaining of a left hand injury that occurred after a mechanical fall that occurred this evening. Pt states he slipped and fell onto his left hand and right knee. No LOC, head injury. He states associated moderate left hand pain and mild right knee pain. No other injuries. Pt is ambulatory. He denies abdominal pain or back pain, numbness, focal weakness. Has been able to angulate without difficulty.  ROS: See HPI Constitutional: no fever  Eyes: no drainage  ENT: no runny nose   Cardiovascular:  no chest pain  Resp: no SOB  GI: no vomiting, abdominal pain GU: no dysuria Integumentary: no rash  Allergy: no hives  Musculoskeletal: no leg swelling, back pain. Positive for hand pain, knee pain  Neurological: no slurred speech, numbness ROS otherwise negative  PAST MEDICAL HISTORY/PAST SURGICAL HISTORY:  Past Medical History  Diagnosis Date  . Borderline hypertension   . 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 Acute deep vein thrombosis (DVT) of popliteal vein of right lower extremity ([place]) 03/2014  . Sleep apnea   . Diabetes mellitus type 2 in obese ([place]) 03/2014    HgbA1c 13.0  . Allergy     MEDICATIONS:  Prior to Admission medications   Medication Sig Start Date End Date Taking? Authorizing Provider  blood glucose meter kit and supplies KIT Dispense based on patient and insurance preference. Use up to four times daily as directed. (FOR ICD-9 250.00, 250.01). 03/21/14   Belkys A Regalado, MD   insulin aspart (NOVOLOG FLEXPEN) 100 UNIT/ML FlexPen Inject 4 Units into the skin 3 (three) times daily with meals. 03/21/14   Belkys A Regalado, MD  Insulin Glargine (LANTUS SOLOSTAR) 100 UNIT/ML Solostar Pen Inject 20 Units into the skin daily at 10 pm. 03/21/14   Belkys A Regalado, MD  Insulin Pen Needle (B-D UF III MINI PEN NEEDLES) 31G X 5 MM MISC 1 Units by Does not apply route once. 08/18/14   [place], FNP  rivaroxaban (XARELTO) 20 MG TABS tablet Take 1 tablet (20 mg total) by mouth daily with supper. 12/08/14   Tammy S Parrett, NP  tadalafil (CIALIS) 10 MG tablet Take 0.5-1 tablets (5-10 mg total) by mouth daily as needed for erectile dysfunction. 09/22/14   [place], FNP    ALLERGIES:  No Known Allergies  SOCIAL HISTORY:  Social History  Substance Use Topics  . Smoking status: Former Smoker -- 0.00 packs/day for 12 years    Types: Cigarettes    Quit date: 02/13/2014  . Smokeless tobacco: Never Used  . Alcohol Use: 0.0 oz/week    0 Standard drinks or equivalent per week     Comment: occasionallly    FAMILY HISTORY: Family History  Problem Relation Age of Onset  . Arrhythmia Mother   . Breast cancer Mother   . Hyperlipidemia Mother   . Diabetes Father   . Hypertension Father   . Cancer Maternal Uncle   . Cancer Maternal Grandfather   .  Breast cancer Maternal Grandmother   . Stroke Paternal Grandmother     EXAM: BP 122/60 mmHg  Pulse 128  Temp(Src) 98.2 F (36.8 C) (Oral)  Resp 24  Ht 6' 0.5" (1.842 m)  Wt 335 lb (151.955 kg)  BMI 44.79 kg/m2  SpO2 94% CONSTITUTIONAL: Alert and oriented and responds appropriately to questions. Well-appearing; well-nourished HEAD: Normocephalic, atraumatic EYES: Conjunctivae clear, PERRL ENT: normal nose; no rhinorrhea; moist mucous membranes; pharynx without lesions noted NECK: Supple, no meningismus, no LAD , no midline spinal tenderness or step-off or deformity CARD: Regular and tachycardic; S1 and S2 appreciated;  no murmurs, no clicks, no rubs, no gallops RESP: Normal chest excursion without splinting or tachypnea; breath sounds clear and equal bilaterally; no wheezes, no rhonchi, no rales, chest wall is nontender without crepitus, ecchymosis or deformity ABD/GI: Normal bowel sounds; non-distended; soft, non-tender, no rebound, no guarding BACK:  The back appears normal and is non-tender to palpation, there is no CVA tenderness EXT: No tenderness over the right knee or ligamentous laxity or swelling, tender over the left hand without bony deformity, Normal ROM in all joints; no edema; normal capillary refill; no cyanosis. TTP over left 5th digit of the left hand without bony deformity, edema or ecchymosis. NVI.   2+ radial pulses bilaterally. Sensation normal diffusely. SKIN: Normal color for age and race; warm NEURO: Moves all extremities equally PSYCH: The patient's mood and manner are appropriate. Grooming and personal hygiene are appropriate.  MEDICAL DECISION MAKING: Patient here with mechanical fall. States he is having left hand pain.  X-ray shows no fracture or dislocation. Neurovascular intact distally. He does have complete the pain of the right knee. No bony tenderness. No sign of swelling or deformity. Able to angulate. Have offered x-ray but he declines. He declines pain medication. I feel he is safe to be discharged home with supportive care instructions including rest, elevation, ice and pain medicine as needed. Discussed return precautions. He verbalizes understanding and is comfortable with plan.    Of note, EKG documented under this patient is documented under the wrong patient. This EKG is actually for patient Margaretha Glassing MRN [id number]    I personally performed the services described in this documentation, which was scribed in my presence. The recorded information has been reviewed and is accurate.   [place], DO 12/13/14 [phone]

## 2014-12-13 NOTE — Discharge Instructions (Signed)

## 2014-12-13 NOTE — ED Notes (Signed)
Pt. tripped and fell this evening injured his left hand with pain / no swelling .

## 2014-12-15 ENCOUNTER — Other Ambulatory Visit: Payer: Self-pay

## 2014-12-15 ENCOUNTER — Telehealth: Payer: Self-pay | Admitting: Pulmonary Disease

## 2014-12-15 ENCOUNTER — Ambulatory Visit (HOSPITAL_COMMUNITY): Payer: Managed Care, Other (non HMO) | Attending: Cardiovascular Disease

## 2014-12-15 DIAGNOSIS — R911 Solitary pulmonary nodule: Secondary | ICD-10-CM

## 2014-12-15 DIAGNOSIS — I10 Essential (primary) hypertension: Secondary | ICD-10-CM | POA: Insufficient documentation

## 2014-12-15 DIAGNOSIS — I2699 Other pulmonary embolism without acute cor pulmonale: Secondary | ICD-10-CM | POA: Diagnosis not present

## 2014-12-15 DIAGNOSIS — Z6841 Body Mass Index (BMI) 40.0 and over, adult: Secondary | ICD-10-CM | POA: Insufficient documentation

## 2014-12-15 NOTE — Telephone Encounter (Signed)
Patient notified of Echo results.  Verbalized understanding, no questions or concerns. Nothing further needed. Closing encounter

## 2014-12-15 NOTE — Telephone Encounter (Signed)
lmtcb for pt.   Notes Recorded by Julio Sicksammy S Parrett, NP on 12/15/2014 at 8:43 AM Echo is improved /return to normal >pump function is good  Cont w/ ov recs

## 2014-12-15 NOTE — Telephone Encounter (Signed)
Patient Returned call   Call back [phone]

## 2014-12-15 NOTE — Progress Notes (Signed)
Quick Note:  LVM for pt to return call ______ 

## 2014-12-15 NOTE — Progress Notes (Signed)
Quick Note:  Please see phone note from 11.1.2016. ______ 

## 2014-12-25 ENCOUNTER — Other Ambulatory Visit: Payer: Managed Care, Other (non HMO)

## 2014-12-25 ENCOUNTER — Ambulatory Visit: Payer: Managed Care, Other (non HMO)

## 2014-12-25 ENCOUNTER — Ambulatory Visit: Payer: Managed Care, Other (non HMO) | Admitting: Hematology & Oncology

## 2015-01-08 ENCOUNTER — Ambulatory Visit: Payer: Managed Care, Other (non HMO)

## 2015-01-08 ENCOUNTER — Ambulatory Visit (HOSPITAL_BASED_OUTPATIENT_CLINIC_OR_DEPARTMENT_OTHER): Payer: Managed Care, Other (non HMO)

## 2015-01-08 ENCOUNTER — Ambulatory Visit (HOSPITAL_BASED_OUTPATIENT_CLINIC_OR_DEPARTMENT_OTHER): Payer: Managed Care, Other (non HMO) | Admitting: Hematology & Oncology

## 2015-01-08 ENCOUNTER — Encounter: Payer: Self-pay | Admitting: Hematology & Oncology

## 2015-01-08 ENCOUNTER — Telehealth: Payer: Self-pay | Admitting: *Deleted

## 2015-01-08 ENCOUNTER — Other Ambulatory Visit: Payer: Self-pay | Admitting: *Deleted

## 2015-01-08 VITALS — BP 137/83 | HR 105 | Temp 98.5°F | Resp 16 | Ht 72.0 in | Wt 333.0 lb

## 2015-01-08 DIAGNOSIS — I82401 Acute embolism and thrombosis of unspecified deep veins of right lower extremity: Secondary | ICD-10-CM | POA: Diagnosis not present

## 2015-01-08 DIAGNOSIS — E1122 Type 2 diabetes mellitus with diabetic chronic kidney disease: Secondary | ICD-10-CM

## 2015-01-08 DIAGNOSIS — I2602 Saddle embolus of pulmonary artery with acute cor pulmonale: Secondary | ICD-10-CM

## 2015-01-08 DIAGNOSIS — E119 Type 2 diabetes mellitus without complications: Secondary | ICD-10-CM

## 2015-01-08 LAB — CBC WITH DIFFERENTIAL (CANCER CENTER ONLY)
BASO#: 0 10*3/uL (ref 0.0–0.2)
BASO%: 0.2 % (ref 0.0–2.0)
EOS ABS: 0.1 10*3/uL (ref 0.0–0.5)
EOS%: 2.3 % (ref 0.0–7.0)
HEMATOCRIT: 43.7 % (ref 38.7–49.9)
HEMOGLOBIN: 15.2 g/dL (ref 13.0–17.1)
LYMPH#: 1.5 10*3/uL (ref 0.9–3.3)
LYMPH%: 31.1 % (ref 14.0–48.0)
MCH: 26.7 pg — AB (ref 28.0–33.4)
MCHC: 34.8 g/dL (ref 32.0–35.9)
MCV: 77 fL — AB (ref 82–98)
MONO#: 0.4 10*3/uL (ref 0.1–0.9)
MONO%: 9 % (ref 0.0–13.0)
NEUT%: 57.4 % (ref 40.0–80.0)
NEUTROS ABS: 2.8 10*3/uL (ref 1.5–6.5)
Platelets: 278 10*3/uL (ref 145–400)
RBC: 5.7 10*6/uL (ref 4.20–5.70)
RDW: 15 % (ref 11.1–15.7)
WBC: 4.8 10*3/uL (ref 4.0–10.0)

## 2015-01-08 LAB — CMP (CANCER CENTER ONLY)
ALBUMIN: 3.7 g/dL (ref 3.3–5.5)
ALT(SGPT): 47 U/L (ref 10–47)
AST: 30 U/L (ref 11–38)
Alkaline Phosphatase: 110 U/L — ABNORMAL HIGH (ref 26–84)
BUN, Bld: 11 mg/dL (ref 7–22)
CHLORIDE: 93 meq/L — AB (ref 98–108)
CO2: 28 mEq/L (ref 18–33)
CREATININE: 1.1 mg/dL (ref 0.6–1.2)
Calcium: 10 mg/dL (ref 8.0–10.3)
Glucose, Bld: 557 mg/dL (ref 73–118)
Potassium: 4.3 mEq/L (ref 3.3–4.7)
SODIUM: 139 meq/L (ref 128–145)
TOTAL PROTEIN: 7.9 g/dL (ref 6.4–8.1)
Total Bilirubin: 0.6 mg/dl (ref 0.20–1.60)

## 2015-01-08 MED ORDER — FOLIC ACID 1 MG PO TABS: 1.0000 mg | ORAL_TABLET | Freq: Every day | ORAL | Status: DC

## 2015-01-08 MED ORDER — INSULIN REGULAR HUMAN 100 UNIT/ML IJ SOLN
20.0000 [IU] | Freq: Once | INTRAMUSCULAR | Status: AC
  Administered 2015-01-08: 20 [IU] via SUBCUTANEOUS

## 2015-01-08 NOTE — Patient Instructions (Signed)
Insulin Aspart injection What is this medicine? INSULIN ASPART (IN su lin AS part) is a human-made form of insulin. This drug lowers the amount of sugar in your blood. It is a fast acting insulin that starts working faster than regular insulin. It will not work as long as regular insulin. This medicine may be used for other purposes; ask your health care provider or pharmacist if you have questions. What should I tell my health care provider before I take this medicine? They need to know if you have any of these conditions: -episodes of hypoglycemia -kidney disease -liver disease -an unusual or allergic reaction to insulin, metacresol, other medicines, foods, dyes, or preservatives -pregnant or trying to get pregnant -breast-feeding How should I use this medicine? This medicine is for injection under the skin. Use exactly as directed. It is important to follow the directions given to you by your health care professional or doctor. You should start your meal within 5 to 10 minutes after injection. Have food ready before injection. Do not delay eating. You will be taught how to use this medicine and how to adjust doses for activities and illness. Do not use more insulin than prescribed. Do not use more or less often than prescribed. Always check the appearance of your insulin before using it. This medicine should be clear and colorless like water. Do not use if it is cloudy, thickened, colored, or has solid particles in it. It is important that you put your used needles and syringes in a special sharps container. Do not put them in a trash can. If you do not have a sharps container, call your pharmacist or healthcare provider to get one. Talk to your pediatrician regarding the use of this medicine in children. While this drug may be prescribed for children as young as 6 years of age for selected conditions, precautions do apply. Overdosage: If you think you have taken too much of this medicine contact  a poison control center or emergency room at once. NOTE: This medicine is only for you. Do not share this medicine with others. What if I miss a dose? It is important not to miss a dose. Your health care professional or doctor should discuss a plan for missed doses with you. If you do miss a dose, follow their plan. Do not take double doses. What may interact with this medicine? -other medicines for diabetes Many medications may cause an increase or decrease in blood sugar, these include: -alcohol containing beverages -aspirin and aspirin-like drugs -chloramphenicol -chromium -diuretics -male hormones, like estrogens or progestins and birth control pills -heart medicines -isoniazid -male hormones or anabolic steroids -medicines for weight loss -medicines for allergies, asthma, cold, or cough -medicines for mental problems -medicines called MAO Inhibitors like Nardil, Parnate, Marplan, Eldepryl -niacin -NSAIDs, medicines for pain and inflammation, like ibuprofen or naproxen -pentamidine -phenytoin -probenecid -quinolone antibiotics like ciprofloxacin, levofloxacin, ofloxacin -some herbal dietary supplements -steroid medicines like prednisone or cortisone -thyroid medicine Some medications can hide the warning symptoms of low blood sugar. You may need to monitor your blood sugar more closely if you are taking one of these medications. These include: -beta-blockers such as atenolol, metoprolol, propranolol -clonidine -guanethidine -reserpine This list may not describe all possible interactions. Give your health care provider a list of all the medicines, herbs, non-prescription drugs, or dietary supplements you use. Also tell them if you smoke, drink alcohol, or use illegal drugs. Some items may interact with your medicine. What should I watch for while   using this medicine? Visit your health care professional or doctor for regular checks on your progress. A test called the HbA1C  (A1C) will be monitored. This is a simple blood test. It measures your blood sugar control over the last 2 to 3 months. You will receive this test every 3 to 6 months. Learn how to check your blood sugar. Learn the symptoms of low and high blood sugar and how to manage them. Always carry a quick-source of sugar with you in case you have symptoms of low blood sugar. Examples include hard sugar candy or glucose tablets. Make sure others know that you can choke if you eat or drink when you develop serious symptoms of low blood sugar, such as seizures or unconsciousness. They must get medical help at once. Tell your doctor or health care professional if you have high blood sugar. You might need to change the dose of your medicine. If you are sick or exercising more than usual, you might need to change the dose of your medicine. Do not skip meals. Ask your doctor or health care professional if you should avoid alcohol. Many nonprescription cough and cold products contain sugar or alcohol. These can affect blood sugar. Make sure that you have the right kind of syringe for the type of insulin you use. Try not to change the brand and type of insulin or syringe unless your health care professional or doctor tells you to. Switching insulin brand or type can cause dangerously high or low blood sugar. Always keep an extra supply of insulin, syringes, and needles on hand. Use a syringe one time only. Throw away syringe and needle in a closed container to prevent accidental needle sticks. Insulin pens and cartridges should never be shared. Even if the needle is changed, sharing may result in passing of viruses like hepatitis or HIV. Wear a medical ID bracelet or chain, and carry a card that describes your disease and details of your medicine and dosage times. What side effects may I notice from receiving this medicine? Side effects that you should report to your health care professional or doctor as soon as  possible: -allergic reactions like skin rash, itching or hives, swelling of the face, lips, or tongue -breathing problems -signs and symptoms of high blood sugar such as dizziness, dry mouth, dry skin, fruity breath, nausea, stomach pain, increased hunger or thirst, increased urination -signs and symptoms of low blood sugar such as feeling anxious, confusion, dizziness, increased hunger, unusually weak or tired, sweating, shakiness, cold, irritable, headache, blurred vision, fast heartbeat, loss of consciousness Side effects that usually do not require medical attention (report to your health care professional or doctor if they continue or are bothersome): -increase or decrease in fatty tissue under the skin due to overuse of a particular injection site -itching, burning, swelling, or rash at site where injected This list may not describe all possible side effects. Call your doctor for medical advice about side effects. You may report side effects to FDA at 1-800-FDA-1088. Where should I keep my medicine? Keep out of the reach of children. Store unopened insulin vials in a refrigerator between 2 and 8 degrees C (36 and 46 degrees F). Do not freeze or use if the insulin has been frozen. Opened vials (vials currently in use) may be stored in the refrigerator or at room temperature, at approximately 30 degrees C (86 degrees F) or cooler. Keeping your insulin at room temperature decreases the amount of pain during injection. Once opened,   your insulin can be used for 28 days. After 28 days, the vial of insulin should be thrown away. Store unopened cartridges, FlexPens, or Novalog Innolet systems in a refrigerator between 2 and 8 degrees C (36 and 46 degrees F.) Do not freeze or use if the insulin has been frozen. Once opened, the Novalog Innolet system, FlexPen, and cartridges that are inserted into pens should be kept at room temperature, approximately 25 degrees C (77 degrees F) or cooler. Do not store in  the refrigerator. Once opened, the insulin can be used for 28 days. After 28 days, the cartridge, Novalog Innolet system or FlexPen should be thrown away. Protect from light and excessive heat. Throw away any unused medicine after the expiration date or after the specified time for room temperature storage has passed. NOTE: This sheet is a summary. It may not cover all possible information. If you have questions about this medicine, talk to your doctor, pharmacist, or health care provider.    2016, [employer]vier/[employer] Standard. (2013-04-25 10:23:01)  

## 2015-01-08 NOTE — Telephone Encounter (Signed)
Critical Value Glucose 557 Dr Myna HidalgoEnnever notified. No orders at this time.

## 2015-01-08 NOTE — Progress Notes (Signed)
Referral MD  Reason for Referral: Saddle pulmonary embolism and right lower extremity thrombus   Chief Complaint  Patient presents with  . OTHER    New Patient  : I'm here because at a blood clot in February.  HPI: Mr. Jordan Cline is a very nice 39 year old African-American male. He has a history of sleep apnea. His history of insulin-dependent diabetes.  He works at a desk job.  Back in February, he got short of breath. He had no cough. He had no hemoptysis. He had no leg swelling. Per every 1 to the hospital. Is found to have a large saddle pulmonary embolism. He had right heart strain. He did receive thrombolytic therapy. He was subsequently discharged on Xarelto.  While hospital lost, he had a thrombus noted in the right popliteal vein on Doppler.  He was referred to hematology. Apparently, the hematologist that he was supposed to see could not see him. As such, he finally made it out to [place]. He only works about 5 minutes from our office.  He feels okay. He does not have any chest wall pain. He does not have any shortness of breath. He does not have any swelling in the right leg that is unusual. He says his right leg has was been a little bit bigger than his left leg.  He has a patent foramen ovale. He says this does not need to be treated.  He denies any kind of trauma. His been no long-distance travel before this thromboembolic event.  He does have sleep apnea. He does wear CPAP.  He does smoke. He probably has about a 20-pack-year history of tobacco use. He stopped after he had a blood clot.  He has not had any additional studies done to see if there's been resolution of his thromboembolic disease.  There is no family history of blood clots outside of a cousin who has had 2 blood clots already.  He does have sickle cell trait. He is not on folic acid. I will call folic acid in for him.  He has not had any change in bowel or bladder habits. He's had no nausea or vomiting. He's had  no rashes. He's had no weight loss or weight gain.  Currently, his performance status is ECOG 0.  He says that he drank quite a bit yesterday on Thanksgiving. He did not take his insulin this morning.                 Past Medical History  Diagnosis Date  . Borderline hypertension   . 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 Acute deep vein thrombosis (DVT) of popliteal vein of right lower extremity ([facility]) 03/2014  . Sleep apnea   . Diabetes mellitus type 2 in obese ([facility]) 03/2014    HgbA1c 13.0  . Allergy   :  No past surgical history on file.:   Current outpatient prescriptions:  .  blood glucose meter kit and supplies KIT, Dispense based on patient and insurance preference. Use up to four times daily as directed. (FOR ICD-9 250.00, 250.01)., Disp: 1 each, Rfl: 0 .  insulin aspart (NOVOLOG FLEXPEN) 100 UNIT/ML FlexPen, Inject 4 Units into the skin 3 (three) times daily with meals., Disp: 15 mL, Rfl: 11 .  Insulin Glargine (LANTUS SOLOSTAR) 100 UNIT/ML Solostar Pen, Inject 20 Units into the skin daily at 10 pm., Disp: 15 mL, Rfl: 11 .  Insulin Pen Needle (B-D UF  III MINI PEN NEEDLES) 31G X 5 MM MISC, 1 Units by Does not apply route once., Disp: 100 each, Rfl: 3 .  rivaroxaban (XARELTO) 20 MG TABS tablet, Take 1 tablet (20 mg total) by mouth daily with supper., Disp: 30 tablet, Rfl: 3  Current facility-administered medications:  .  insulin regular (NOVOLIN R,HUMULIN R) 100 units/mL injection 20 Units, 20 Units, Subcutaneous, Once, Volanda Napoleon, MD:  . insulin regular  20 Units Subcutaneous Once  :  No Known Allergies:  Family History  Problem Relation Age of Onset  . Arrhythmia Mother   . Breast cancer Mother   . Hyperlipidemia Mother   . Diabetes Father   . Hypertension Father   . Cancer Maternal Uncle   . Cancer Maternal Grandfather   . Breast cancer Maternal Grandmother   . Stroke Paternal Grandmother    :  Social History   Social History  . Marital Status: Significant Other    Spouse Name: N/A  . Number of Children: 0  . Years of Education: 16   Occupational History  . Health Insurance    Social History Main Topics  . Smoking status: Former Smoker -- 0.00 packs/day for 12 years    Types: Cigarettes    Quit date: 02/13/2014  . Smokeless tobacco: Never Used  . Alcohol Use: 0.0 oz/week    0 Standard drinks or equivalent per week     Comment: occasionallly  . Drug Use: No  . Sexual Activity: Not on file   Other Topics Concern  . Not on file   Social History Narrative   Getting married on June 11th to his fiance. Fun: Outdoors, traveling, movies, some sports.    Denies any religious beliefs effecting health care.   :  Pertinent items are noted in HPI.  Exam: _0 @  obese African-American male. Vital signs show temperature of 98.5. Pulse 105. Blood pressure 137/83. Weight is 333 pounds. Head and neck exam shows no ocular or oral lesions. He has no palpable cervical or supraclavicular lymph nodes. Lungs are clear bilaterally. No rales, wheezes or rhonchi are noted. Cardiac exam regular rate and rhythm with no murmurs, rubs or bruits. Abdomen is soft. He has good bowel sounds. There is no fluid wave. There is no palpable liver or spleen tip. Back exam shows no tenderness over the spine, ribs or hips. Extremities shows some chronic nonpitting edema of the right leg. No venous cord is noted in the right lower leg. He has a negative Homans sign. Left leg is unremarkable. Skin exam shows no rashes, ecchymoses or petechia. Neurological exam shows no focal neurological deficits.    Recent Labs  01/08/15 1312  WBC 4.8  HGB 15.2  HCT 43.7  PLT 278    Recent Labs  01/08/15 1311  NA 139  K 4.3  CL 93*  CO2 28  GLUCOSE 557*  BUN 11  CREATININE 1.1  CALCIUM 10.0    Blood smear review:  None  Pathology: None     Assessment and Plan:  Mr. Jordan Cline is a 39 year old  African-American male. He has looks like an idiopathic thromboembolic event. He had a significant saddle embolism in his lung. He had a thrombus in his right lower leg.  I think that he probably does not have a thrombophilic condition. However, we will check him.  The fact that he has sleep apnea, diabetes, and smoking would all be factors for him to have thromboembolic disease.  I think that he is going  to need to years of Xarelto. I think he had a very severe pulmonary embolism. Given that fact, plus his other risk factors, I think that 2 years would be reasonable.  His blood sugars are 557 today. This is on acceptable to me. I think his blood sugars need to be under much better control. If not, I think this will also increase his risk of thromboembolism. We did go ahead and give him 20 units of regular insulin today.  We will go ahead and plan to repeat a CT antigram of his chest and Doppler of his right leg about 2 weeks. I really need to see how well things have improved.  I will like to see him back in 3 months.  I don't anticipate making any changes to the Xarelto. Again, I think 2 years is going be necessary.  I spent about 45 minutes with him today.

## 2015-01-09 ENCOUNTER — Other Ambulatory Visit: Payer: Self-pay | Admitting: Family

## 2015-01-11 ENCOUNTER — Other Ambulatory Visit: Payer: Self-pay | Admitting: *Deleted

## 2015-01-11 DIAGNOSIS — I82431 Acute embolism and thrombosis of right popliteal vein: Secondary | ICD-10-CM

## 2015-01-11 LAB — RFX DRVVT SCR W/RFLX CONF 1:1 MIX: DRVVT SCREEN: 73 s — AB (ref <=45)

## 2015-01-11 LAB — RFLX DRVVT CONFRIM: Drvvt confirmation: NEGATIVE

## 2015-01-11 LAB — RFX PTT-LA W/RFX TO HEX PHASE CONF: PTT-LA Screen: 38 s (ref <=40)

## 2015-01-12 ENCOUNTER — Telehealth: Payer: Self-pay | Admitting: Family

## 2015-01-12 LAB — HYPERCOAGULABLE PANEL, COMPREHENSIVE
ANTITHROMB III FUNC: 102 %{activity} (ref 80–120)
Anticardiolipin IgA: <11 [APL'U]
Anticardiolipin IgG: <14 [GPL'U]
BETA-2-GLYCOPROTEIN I IGA: 9 SAU (ref <=20)
Beta-2 Glyco I IgG: <9 SGU (ref <=20)
PROTEIN C ANTIGEN: 118 % (ref 70–140)
PROTEIN S ANTIGEN, TOTAL: 155 % — AB (ref 70–140)
Protein C Activity: 154 % (ref 70–180)
Protein S Activity: 173 % — ABNORMAL HIGH (ref 70–150)

## 2015-01-12 NOTE — Telephone Encounter (Signed)
Patients wife is calling to follow up. She is looking for direction on how to proceed with the below symptoms. Please give her a call.

## 2015-01-12 NOTE — Telephone Encounter (Signed)
Spoke with Clydie BraunKaren regarding blood sugars. Indicates that he is taking the medication as prescribed and denies missing dosages. Blood sugars have been running elevated in the 200-400 range. Instructions provided to increase Lantus to 25 units. If no response in 5 days, increase to 30 units. If 30 units is reached instructed to follow up with office. Continue current dosage of Novolog.

## 2015-01-12 NOTE — Telephone Encounter (Signed)
Patient Name: Jordan Cline  DOB: [birth year]    Initial Comment Caller states husband has been having high BS; diabetic; just began monitoring; Friday went to Cancer center and it was over 500, last night 400; today between 200+ and 300+;; not with him now; no sx;   Nurse Assessment  Nurse: Annye Englisharmon, RN, Angelique Blonderenise Date/Time (Eastern Time): 01/12/2015 2:57:35 PM  Confirm and document reason for call. If symptomatic, describe symptoms. ---Caller states the pt BG have been high and running 200-500 over the last couple of days. States the pt is at work and she is not sure he will make the call to the [facility] himself for asst.  Has the patient traveled out of the country within the last 30 days? ---Not Applicable  Does the patient have any new or worsening symptoms? ---Yes  Will a triage be completed? ---No  Select reason for no triage. ---Other  Please document clinical information provided and list any resource used. ---Pt not avail for assess/triage. Advised caller I would need to speak directly to the pt for assess/triage due to inability to verify POA or her ability to speak on the pt behalf. Caller insists I speak to her as the pt is not present. Advised I will trans her to the [facility] for further asst as they will have the ability to verify if she has authority to speak on pt behalf. Verb understanding. Trans call to the [facility] main line.     Guidelines    Guideline Title Affirmed Question Affirmed Notes       Final Disposition User        Comments  Advised caller to have the pt call back at his convenience for assess/triage/advice.

## 2015-01-13 ENCOUNTER — Telehealth: Payer: Self-pay | Admitting: *Deleted

## 2015-01-13 NOTE — Telephone Encounter (Addendum)
Patient aware of results  ----- Message from Josph MachoPeter R Ennever, MD sent at 01/13/2015  8:13 AM EST ----- Call - blood clotting studies are all normal!!  Thanks!!

## 2015-01-27 ENCOUNTER — Ambulatory Visit (HOSPITAL_COMMUNITY)
Admission: RE | Admit: 2015-01-27 | Discharge: 2015-01-27 | Disposition: A | Discharge status: Home / Self Care | Payer: Managed Care, Other (non HMO) | Source: Ambulatory Visit | Attending: Hematology & Oncology | Admitting: Hematology & Oncology

## 2015-01-27 ENCOUNTER — Encounter (HOSPITAL_COMMUNITY): Payer: Self-pay

## 2015-01-27 ENCOUNTER — Ambulatory Visit (HOSPITAL_BASED_OUTPATIENT_CLINIC_OR_DEPARTMENT_OTHER)
Admission: RE | Admit: 2015-01-27 | Discharge: 2015-01-27 | Disposition: A | Discharge status: Home / Self Care | Payer: Managed Care, Other (non HMO) | Source: Ambulatory Visit | Attending: Hematology & Oncology | Admitting: Hematology & Oncology

## 2015-01-27 DIAGNOSIS — I2602 Saddle embolus of pulmonary artery with acute cor pulmonale: Secondary | ICD-10-CM | POA: Insufficient documentation

## 2015-01-27 DIAGNOSIS — G4730 Sleep apnea, unspecified: Secondary | ICD-10-CM | POA: Diagnosis not present

## 2015-01-27 DIAGNOSIS — E1122 Type 2 diabetes mellitus with diabetic chronic kidney disease: Secondary | ICD-10-CM | POA: Diagnosis present

## 2015-01-27 DIAGNOSIS — R030 Elevated blood-pressure reading, without diagnosis of hypertension: Secondary | ICD-10-CM | POA: Diagnosis not present

## 2015-01-27 DIAGNOSIS — E669 Obesity, unspecified: Secondary | ICD-10-CM | POA: Diagnosis not present

## 2015-01-27 DIAGNOSIS — Z7901 Long term (current) use of anticoagulants: Secondary | ICD-10-CM | POA: Insufficient documentation

## 2015-01-27 DIAGNOSIS — Z86718 Personal history of other venous thrombosis and embolism: Secondary | ICD-10-CM | POA: Diagnosis not present

## 2015-01-27 DIAGNOSIS — I82431 Acute embolism and thrombosis of right popliteal vein: Secondary | ICD-10-CM | POA: Diagnosis not present

## 2015-01-27 MED ORDER — IOHEXOL 350 MG/ML SOLN
100.0000 mL | Freq: Once | INTRAVENOUS | Status: AC | PRN
  Administered 2015-01-27: 91 mL via INTRAVENOUS

## 2015-01-27 NOTE — Progress Notes (Signed)
VASCULAR LAB PRELIMINARY  PRELIMINARY  PRELIMINARY  PRELIMINARY  Right lower extremity venous duplex completed.    Preliminary report:  Right:  No evidence of DVT, superficial thrombosis, or Baker's cyst. Previous DVT of the right popliteal vein noted 03/17/2014 appear to have resolved  Yacob Wilkerson, RVS 01/27/2015, 9:50 AM

## 2015-01-28 ENCOUNTER — Other Ambulatory Visit: Payer: Self-pay

## 2015-01-28 MED ORDER — GLUCOSE BLOOD VI STRP: ORAL_STRIP | Status: AC

## 2015-03-16 ENCOUNTER — Ambulatory Visit (INDEPENDENT_AMBULATORY_CARE_PROVIDER_SITE_OTHER): Payer: Managed Care, Other (non HMO) | Admitting: Adult Health

## 2015-03-16 ENCOUNTER — Encounter: Payer: Self-pay | Admitting: Adult Health

## 2015-03-16 VITALS — BP 130/76 | Temp 98.9°F | Ht 72.0 in | Wt 340.4 lb

## 2015-03-16 DIAGNOSIS — S0502XA Injury of conjunctiva and corneal abrasion without foreign body, left eye, initial encounter: Secondary | ICD-10-CM

## 2015-03-16 MED ORDER — ERYTHROMYCIN 5 MG/GM OP OINT: TOPICAL_OINTMENT | Freq: Four times a day (QID) | OPHTHALMIC | Status: DC

## 2015-03-16 NOTE — Progress Notes (Signed)
Pre visit review using our clinic review tool, if applicable. No additional management support is needed unless otherwise documented below in the visit note. 

## 2015-03-16 NOTE — Progress Notes (Signed)
 Subjective:    Patient ID: Jordan Cline, male    DOB: 01/30/1976, 40 y.o.   MRN: [id number]  HPI  40 year old male who presents to the office today for discomfort and watery drainage from his left eye. He reports that he work up this morning and felt like something was in his left eye. He flushed it out but he continued to have that sensation. As the day has gone on his eye has felt better.   Denies any blurred vision, photo phobia or trauma to the eye.   Review of Systems  Constitutional: Negative.   Eyes: Positive for pain, discharge and redness. Negative for photophobia, itching and visual disturbance.  All other systems reviewed and are negative.  Past Medical History  Diagnosis Date  . Borderline hypertension   . 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 Acute deep vein thrombosis (DVT) of popliteal vein of right lower extremity ([facility]) 03/2014  . Sleep apnea   . Diabetes mellitus type 2 in obese ([facility]) 03/2014    HgbA1c 13.0  . Allergy     Social History   Social History  . Marital Status: Significant Other    Spouse Name: N/A  . Number of Children: 0  . Years of Education: 16   Occupational History  . Health Insurance    Social History Main Topics  . Smoking status: Former Smoker -- 0.00 packs/day for 12 years    Types: Cigarettes    Quit date: 02/13/2014  . Smokeless tobacco: Never Used  . Alcohol Use: 0.0 oz/week    0 Standard drinks or equivalent per week     Comment: occasionallly  . Drug Use: No  . Sexual Activity: Not on file   Other Topics Concern  . Not on file   Social History Narrative   Getting married on June 11th to his fiance. Fun: Outdoors, traveling, movies, some sports.    Denies any religious beliefs effecting health care.     No past surgical history on file.  Family History  Problem Relation Age of Onset  . Arrhythmia Mother   . Breast cancer Mother   . Hyperlipidemia Mother     . Diabetes Father   . Hypertension Father   . Cancer Maternal Uncle   . Cancer Maternal Grandfather   . Breast cancer Maternal Grandmother   . Stroke Paternal Grandmother     No Known Allergies  Current Outpatient Prescriptions on File Prior to Visit  Medication Sig Dispense Refill  . blood glucose meter kit and supplies KIT Dispense based on patient and insurance preference. Use up to four times daily as directed. (FOR ICD-9 250.00, 250.01). 1 each 0  . folic acid (FOLVITE) 1 MG tablet Take 1 tablet (1 mg total) by mouth daily. 90 tablet 6  . glucose blood (ACCU-CHEK AVIVA PLUS) test strip USE AS DIRECTED 4 TIMES A DAY 100 each 2  . insulin aspart (NOVOLOG FLEXPEN) 100 UNIT/ML FlexPen Inject 4 Units into the skin 3 (three) times daily with meals. 15 mL 11  . Insulin Glargine (LANTUS SOLOSTAR) 100 UNIT/ML Solostar Pen Inject 20 Units into the skin daily at 10 pm. 15 mL 11  . Insulin Pen Needle (B-D UF III MINI PEN NEEDLES) 31G X 5 MM MISC 1 Units by Does not apply route once. 100 each 3  . rivaroxaban (XARELTO) 20 MG TABS tablet Take 1 tablet (20 mg total)   by mouth daily with supper. 30 tablet 3   No current facility-administered medications on file prior to visit.    BP 130/76 mmHg  Temp(Src) 98.9 F (37.2 C) (Oral)  Ht 6' (1.829 m)  Wt 340 lb 6.4 oz (154.404 kg)  BMI 46.16 kg/m2       Objective:   Physical Exam  Constitutional: He is oriented to person, place, and time. He appears well-developed and well-nourished. No distress.  Eyes: Conjunctivae and EOM are normal. Pupils are equal, round, and reactive to light. Right eye exhibits no discharge. Left eye exhibits discharge. No scleral icterus.  No injection of conjunctivia  Neurological: He is alert and oriented to person, place, and time.  Skin: Skin is warm and dry. No rash noted. No erythema. No pallor.  Psychiatric: He has a normal mood and affect. His behavior is normal. Judgment and thought content normal.  Nursing  note and vitals reviewed.     Assessment & Plan:  1. Corneal abrasion, left, initial encounter -Tetracaine eye drop was applied to the left eye. Using a woods lamp and fluorecin staining of the left eye a corneal abrasion was noticed on the distal area of left eye. No foreign body noticed - erythromycin ophthalmic ointment; Place into the left eye 4 (four) times daily. For 3 days  Dispense: 3.5 g; Refill: 0 - Follow up if no improvement.   

## 2015-03-16 NOTE — Patient Instructions (Addendum)
It was great meeting you today.   As discussed you have a corneal abrasion but it does not look infected.   You can use the eye drops prescribed once or twice a day for th next 2 days. After that, if you continue to have discomfort, please follow up.   Keep your eye clean and well lubricated.

## 2015-03-19 ENCOUNTER — Encounter: Payer: Self-pay | Admitting: Pulmonary Disease

## 2015-03-19 ENCOUNTER — Ambulatory Visit (INDEPENDENT_AMBULATORY_CARE_PROVIDER_SITE_OTHER): Payer: Managed Care, Other (non HMO) | Admitting: Pulmonary Disease

## 2015-03-19 VITALS — BP 124/78 | HR 80 | Ht 72.5 in | Wt 336.4 lb

## 2015-03-19 DIAGNOSIS — G4733 Obstructive sleep apnea (adult) (pediatric): Secondary | ICD-10-CM

## 2015-03-19 DIAGNOSIS — I2602 Saddle embolus of pulmonary artery with acute cor pulmonale: Secondary | ICD-10-CM | POA: Diagnosis not present

## 2015-03-19 NOTE — Assessment & Plan Note (Signed)
Stay on Xarelto - readdress in 1 year Discussed risk vs benefit

## 2015-03-19 NOTE — Progress Notes (Signed)
   Subjective:    Patient ID: Jordan Cline, male    DOB: [birth year], 40 y.o.   MRN: [id number]  HPI 40 yo male , previous smoker, admitted 03/16/14  with acute shortness of breath found to have a submassive PE with RV strain on CT angiogram, right DVT-  underwent catheter directed thrombolysis. Maintained on Xarelto.  Only risk factor is obesity  03/19/2015  Chief Complaint  Patient presents with  . Follow-up    patient doing well.  no complaints.   Rpt CT angio & duplex 01/2015 nml  ( ennever) hypercoag panel neg  No dyspnea, leg swelling  Wore CPAP first 6 mnths - now only 2/week Pr too high, nasal mask, DME [facility] -ok No dryness   Significant tests/ events  Echo 03/2014 showed .Mild LVH, grade 1 diastolic dysfunction, RV moderately dilated with decreased systolic function. ? Bubble study difficult , possibly positive suggesting PFO.  CT chest angio 03/2014 >Saddle embolus would large emboli extending into both lower lobes. Emboli also noted in both upper lobes. Possible early lingular  pulmonary infarct. HST 3/ 2016>  AHI of 85   Review of Systems Patient denies significant dyspnea,cough, hemoptysis,  chest pain, palpitations, pedal edema, orthopnea, paroxysmal nocturnal dyspnea, lightheadedness, nausea, vomiting, abdominal or  leg pains      Objective:   Physical Exam  Gen. Pleasant, obese, in no distress, normal affect ENT - no lesions, no post nasal drip, class 2-3 airway Neck: No JVD, no thyromegaly, no carotid bruits Lungs: no use of accessory muscles, no dullness to percussion, decreased without rales or rhonchi  Cardiovascular: Rhythm regular, heart sounds  normal, no murmurs or gallops, no peripheral edema Abdomen: soft and non-tender, no hepatosplenomegaly, BS normal.        Assessment & Plan:

## 2015-03-19 NOTE — Assessment & Plan Note (Signed)
Get back on CPAP regularly Obtain download from [facility] & we will change pressure if needed  Weight loss encouraged, compliance with goal of at least 4-6 hrs every night is the expectation. Advised against medications with sedative side effects Cautioned against driving when sleepy - understanding that sleepiness will vary on a day to day basis

## 2015-03-19 NOTE — Patient Instructions (Signed)
Stay on Xarelto - readdress in 1 year Get back on CPAP regularly Obtain download from [facility] & we will change pressure if needed

## 2015-03-25 ENCOUNTER — Other Ambulatory Visit: Payer: Self-pay | Admitting: Family

## 2015-04-07 ENCOUNTER — Other Ambulatory Visit: Payer: Self-pay | Admitting: Family

## 2015-04-13 ENCOUNTER — Other Ambulatory Visit: Payer: Self-pay | Admitting: *Deleted

## 2015-04-13 DIAGNOSIS — R030 Elevated blood-pressure reading, without diagnosis of hypertension: Secondary | ICD-10-CM

## 2015-04-13 DIAGNOSIS — I2602 Saddle embolus of pulmonary artery with acute cor pulmonale: Secondary | ICD-10-CM

## 2015-04-14 ENCOUNTER — Other Ambulatory Visit: Payer: Self-pay | Admitting: Adult Health

## 2015-04-14 ENCOUNTER — Ambulatory Visit: Payer: Managed Care, Other (non HMO) | Admitting: Hematology & Oncology

## 2015-04-14 ENCOUNTER — Telehealth: Payer: Self-pay | Admitting: *Deleted

## 2015-04-14 ENCOUNTER — Other Ambulatory Visit: Payer: Managed Care, Other (non HMO)

## 2015-04-14 NOTE — Telephone Encounter (Signed)
LMAM to reschedule patient appointment from 04/14/2015

## 2015-05-21 ENCOUNTER — Telehealth: Payer: Self-pay | Admitting: Family

## 2015-05-21 MED ORDER — SILDENAFIL CITRATE 20 MG PO TABS: 20.0000 mg | ORAL_TABLET | Freq: Every day | ORAL | Status: DC | PRN

## 2015-05-21 NOTE — Telephone Encounter (Signed)
Pt called stated insurance will not cover for viagra but they will cover for Sylandisl. Please help, pt use [facility] on [address].

## 2015-05-21 NOTE — Telephone Encounter (Signed)
Done

## 2015-05-21 NOTE — Telephone Encounter (Signed)
Please advise 

## 2015-06-25 ENCOUNTER — Other Ambulatory Visit: Payer: Self-pay | Admitting: Family

## 2015-09-03 ENCOUNTER — Other Ambulatory Visit: Payer: Self-pay | Admitting: *Deleted

## 2015-09-03 MED ORDER — SILDENAFIL CITRATE 20 MG PO TABS: ORAL_TABLET | ORAL | Status: DC

## 2015-09-14 ENCOUNTER — Telehealth: Payer: Self-pay

## 2015-09-14 NOTE — Telephone Encounter (Signed)
PA initiated via CoverMyMeds key [id number]

## 2015-09-15 NOTE — Telephone Encounter (Signed)
Noted. Patient may have to pay cash from one of the other pharmacies.

## 2015-09-15 NOTE — Telephone Encounter (Signed)
PA DENIED - prescription plan does not cover medication for ED

## 2015-10-04 ENCOUNTER — Ambulatory Visit (INDEPENDENT_AMBULATORY_CARE_PROVIDER_SITE_OTHER): Payer: Managed Care, Other (non HMO) | Admitting: Nurse Practitioner

## 2015-10-04 ENCOUNTER — Ambulatory Visit: Payer: Managed Care, Other (non HMO) | Admitting: Family

## 2015-10-04 ENCOUNTER — Encounter: Payer: Self-pay | Admitting: Nurse Practitioner

## 2015-10-04 VITALS — BP 128/84 | HR 88 | Temp 98.7°F | Ht 72.0 in | Wt 342.0 lb

## 2015-10-04 DIAGNOSIS — H6121 Impacted cerumen, right ear: Secondary | ICD-10-CM

## 2015-10-04 MED ORDER — CARBAMIDE PEROXIDE 6.5 % OT SOLN: 5.0000 [drp] | Freq: Two times a day (BID) | OTIC | 0 refills | Status: DC

## 2015-10-04 NOTE — Progress Notes (Signed)
Pre visit review using our clinic review tool, if applicable. No additional management support is needed unless otherwise documented below in the visit note. 

## 2015-10-04 NOTE — Progress Notes (Signed)
Subjective:  Patient ID: Jordan Cline, male    DOB: November 01, 1975  Age: 40 y.o. MRN: [id number]  CC: Ear Problem (RT ear clogged after swimming yesterday)   Ear Fullness   There is pain in the right ear. This is a new problem. The current episode started yesterday. The problem occurs constantly. The problem has been unchanged. There has been no fever. The pain is at a severity of 0/10. The patient is experiencing no pain. Associated symptoms include hearing loss. Pertinent negatives include no coughing, neck pain, rhinorrhea or sore throat. He has tried nothing for the symptoms. There is no history of a chronic ear infection, hearing loss or a tympanostomy tube.   Jordan Cline presents for right clogged ear.  Outpatient Medications Prior to Visit  Medication Sig Dispense Refill  . blood glucose meter kit and supplies KIT Dispense based on patient and insurance preference. Use up to four times daily as directed. (FOR ICD-9 250.00, 250.01). 1 each 0  . erythromycin ophthalmic ointment Place into the left eye 4 (four) times daily. For 3 days 3.5 g 0  . folic acid (FOLVITE) 1 MG tablet Take 1 tablet (1 mg total) by mouth daily. 90 tablet 6  . glucose blood (ACCU-CHEK AVIVA PLUS) test strip USE AS DIRECTED 4 TIMES A DAY 100 each 2  . Insulin Pen Needle (B-D UF III MINI PEN NEEDLES) 31G X 5 MM MISC 1 Units by Does not apply route once. 100 each 3  . LANTUS SOLOSTAR 100 UNIT/ML Solostar Pen INJECT 20 UNITS INTO SKIN EVERY DAY AT 10 PM 3 mL 9  . NOVOLOG FLEXPEN 100 UNIT/ML FlexPen INJECT 4 UNITS INTO SKIN 3 TIMES DAILY WITH MEALS 3 mL 3  . sildenafil (REVATIO) 20 MG tablet Can take 1-5 tablets by mouth daily as needed for Erectile dysfunction Yearly physical due in august must see greg for refills 25 tablet 0  . XARELTO 20 MG TABS tablet TAKE 1 TABLET (20 MG TOTAL) BY MOUTH DAILY WITH SUPPER. 30 tablet 5   No facility-administered medications prior to visit.     ROS Review of Systems   HENT: Positive for hearing loss. Negative for rhinorrhea and sore throat.   Respiratory: Negative for cough.   Musculoskeletal: Negative for neck pain.    Objective:  BP 128/84 (BP Location: Left Arm, Patient Position: Sitting, Cuff Size: Large)   Pulse 88   Temp 98.7 F (37.1 Jordan) (Oral)   Ht 6' (1.829 m)   Wt (!) 342 lb (155.1 kg)   SpO2 97%   BMI 46.38 kg/m   BP Readings from Last 3 Encounters:  10/04/15 128/84  03/19/15 124/78  03/16/15 130/76    Wt Readings from Last 3 Encounters:  10/04/15 (!) 342 lb (155.1 kg)  03/19/15 (!) 336 lb 6.4 oz (152.6 kg)  03/16/15 (!) 340 lb 6.4 oz (154.4 kg)    Physical Exam  Constitutional: He appears well-developed and well-nourished.  HENT:  Right Ear: External ear and ear canal normal. No mastoid tenderness.  Left Ear: Tympanic membrane, external ear and ear canal normal. No mastoid tenderness.  No middle ear effusion.  Nose: Nose normal.  Mouth/Throat: Oropharynx is clear and moist. No oropharyngeal exudate.  Unable to visualize right TM due to presence of cerumen.  Neck: Neck supple.  Lymphadenopathy:    He has no cervical adenopathy.  Vitals reviewed.   Lab Results  Component Value Date   WBC 4.8 01/08/2015   HGB 15.2  01/08/2015   HCT 43.7 01/08/2015   PLT 278 01/08/2015   GLUCOSE 557 (HH) 01/08/2015   ALT 47 01/08/2015   AST 30 01/08/2015   NA 139 01/08/2015   K 4.3 01/08/2015   CL 93 (L) 01/08/2015   CREATININE 1.1 01/08/2015   BUN 11 01/08/2015   CO2 28 01/08/2015   INR 1.15 03/16/2014   HGBA1C 7.2 (H) 09/22/2014   MICROALBUR 22.3 (H) 09/22/2014    Procedure Note :     Procedure :  Ear irrigation   Indication:  Cerumen impaction   Risks, including pain, dizziness, eardrum perforation, bleeding, infection and others as well as benefits were explained to the patient in detail. Verbal consent was obtained and the patient agreed to proceed.  Applied 4drops of debrox solution 40mns prior to irrigation.  We  used "The Elephant Ear Irrigation Device" filled with lukewarm water for irrigation. A large amount wax was recovered. Procedure has also required manual wax removal with an ear loop. Effort: difficult  Tolerated poorly, complain of pain with water pressure.  Complications: None.   Assessment & Plan:   CVimalwas seen today for ear problem.  Diagnoses and all orders for this visit:  Cerumen impaction, right  Other orders -     carbamide peroxide (DEBROX) 6.5 % otic solution; Place 5 drops into the right ear 2 (two) times daily.   I am having Jordan Cline start on carbamide peroxide. I am also having him maintain his blood glucose meter kit and supplies, Insulin Pen Needle, folic acid, glucose blood, erythromycin, NOVOLOG FLEXPEN, LANTUS SOLOSTAR, XARELTO, and sildenafil.  Meds ordered this encounter  Medications  . carbamide peroxide (DEBROX) 6.5 % otic solution    Sig: Place 5 drops into the right ear 2 (two) times daily.    Dispense:  15 mL    Refill:  0    Order Specific Question:   Supervising Provider    Answer:   Jordan Cline[[id number]]     Follow-up: Return in about 2 days (around 10/06/2015) for ear lavage by nurse.  CWilfred Lacy NP

## 2015-10-04 NOTE — Patient Instructions (Signed)
Return to office on Wednesday for ear lavage by nurse

## 2015-10-06 ENCOUNTER — Ambulatory Visit (INDEPENDENT_AMBULATORY_CARE_PROVIDER_SITE_OTHER): Payer: Managed Care, Other (non HMO) | Admitting: Nurse Practitioner

## 2015-10-06 ENCOUNTER — Ambulatory Visit: Payer: Managed Care, Other (non HMO) | Admitting: Family

## 2015-10-06 ENCOUNTER — Encounter: Payer: Self-pay | Admitting: Nurse Practitioner

## 2015-10-06 VITALS — Ht 72.0 in | Wt 340.0 lb

## 2015-10-06 DIAGNOSIS — H6121 Impacted cerumen, right ear: Secondary | ICD-10-CM

## 2015-10-06 NOTE — Progress Notes (Signed)
Subjective:  Patient ID: Jordan Cline, male    DOB: 01-Aug-1975  Age: 40 y.o. MRN: [id number]  CC: Ear Problem (returns for ear lavage)   HPI Jordan Cline presents for ear irrigation after 2days of debrox use in right ear. He denies any new complains since last OV.  Outpatient Medications Prior to Visit  Medication Sig Dispense Refill  . blood glucose meter kit and supplies KIT Dispense based on patient and insurance preference. Use up to four times daily as directed. (FOR ICD-9 250.00, 250.01). 1 each 0  . carbamide peroxide (DEBROX) 6.5 % otic solution Place 5 drops into the right ear 2 (two) times daily. 15 mL 0  . erythromycin ophthalmic ointment Place into the left eye 4 (four) times daily. For 3 days 3.5 g 0  . folic acid (FOLVITE) 1 MG tablet Take 1 tablet (1 mg total) by mouth daily. 90 tablet 6  . glucose blood (ACCU-CHEK AVIVA PLUS) test strip USE AS DIRECTED 4 TIMES A DAY 100 each 2  . Insulin Pen Needle (B-D UF III MINI PEN NEEDLES) 31G X 5 MM MISC 1 Units by Does not apply route once. 100 each 3  . LANTUS SOLOSTAR 100 UNIT/ML Solostar Pen INJECT 20 UNITS INTO SKIN EVERY DAY AT 10 PM 3 mL 9  . NOVOLOG FLEXPEN 100 UNIT/ML FlexPen INJECT 4 UNITS INTO SKIN 3 TIMES DAILY WITH MEALS 3 mL 3  . sildenafil (REVATIO) 20 MG tablet Can take 1-5 tablets by mouth daily as needed for Erectile dysfunction Yearly physical due in august must see greg for refills 25 tablet 0  . XARELTO 20 MG TABS tablet TAKE 1 TABLET (20 MG TOTAL) BY MOUTH DAILY WITH SUPPER. 30 tablet 5   No facility-administered medications prior to visit.     ROS Review of Systems  HENT: Negative for sinus pressure.   Eyes: Negative for pain, discharge, redness, itching and visual disturbance.  Neurological: Negative for dizziness and light-headedness.    Objective:  Ht 6' (1.829 m)   Wt (!) 340 lb (154.2 kg)   BMI 46.11 kg/m   BP Readings from Last 3 Encounters:  10/04/15 128/84  03/19/15 124/78    03/16/15 130/76    Wt Readings from Last 3 Encounters:  10/06/15 (!) 340 lb (154.2 kg)  10/04/15 (!) 342 lb (155.1 kg)  03/19/15 (!) 336 lb 6.4 oz (152.6 kg)    Physical Exam  HENT:  Right Ear: Hearing, tympanic membrane, external ear and ear canal normal. No tenderness. No mastoid tenderness. Tympanic membrane is not injected and not erythematous. No middle ear effusion.  Left Ear: Hearing, tympanic membrane, external ear and ear canal normal. No tenderness. No mastoid tenderness. Tympanic membrane is not injected and not erythematous.  No middle ear effusion.  Ears appear normal post irrigation.    Lab Results  Component Value Date   WBC 4.8 01/08/2015   HGB 15.2 01/08/2015   HCT 43.7 01/08/2015   PLT 278 01/08/2015   GLUCOSE 557 (HH) 01/08/2015   ALT 47 01/08/2015   AST 30 01/08/2015   NA 139 01/08/2015   K 4.3 01/08/2015   CL 93 (L) 01/08/2015   CREATININE 1.1 01/08/2015   BUN 11 01/08/2015   CO2 28 01/08/2015   INR 1.15 03/16/2014   HGBA1C 7.2 (H) 09/22/2014   MICROALBUR 22.3 (H) 09/22/2014   Procedure Note :     Procedure :  Ear irrigation  Indication:  Cerumen impaction  Risks, including  pain, dizziness, eardrum perforation, bleeding, infection and others as well as benefits were explained to the patient in detail. Verbal consent was obtained and the patient agreed to proceed.    We used "The Elephant Ear Irrigation Device" filled with lukewarm water for irrigation. A large amount wax was recovered. Procedure has also required manual wax removal with an ear loop.  Tolerated well. Complications: None.  Postprocedure instructions :  Call if problems.   Assessment & Plan:   Jordan Cline was seen today for ear problem.  Diagnoses and all orders for this visit:  Cerumen impaction, right   I am having Jordan Cline maintain his blood glucose meter kit and supplies, Insulin Pen Needle, folic acid, glucose blood, erythromycin, NOVOLOG FLEXPEN, LANTUS SOLOSTAR,  XARELTO, sildenafil, and carbamide peroxide.  No orders of the defined types were placed in this encounter.    Follow-up: Return if symptoms worsen or fail to improve.  Wilfred Lacy, NP

## 2015-10-06 NOTE — Patient Instructions (Signed)
Avoid using cotton tip to clean ear.

## 2015-10-13 ENCOUNTER — Other Ambulatory Visit: Payer: Self-pay | Admitting: Pulmonary Disease

## 2015-10-19 ENCOUNTER — Other Ambulatory Visit: Payer: Self-pay | Admitting: Family

## 2015-11-14 ENCOUNTER — Other Ambulatory Visit: Payer: Self-pay | Admitting: Pulmonary Disease

## 2015-12-01 ENCOUNTER — Other Ambulatory Visit: Payer: Self-pay | Admitting: Family

## 2016-01-11 ENCOUNTER — Other Ambulatory Visit: Payer: Self-pay | Admitting: Hematology & Oncology

## 2016-02-25 ENCOUNTER — Telehealth: Payer: Self-pay | Admitting: Family

## 2016-02-25 NOTE — Telephone Encounter (Signed)
Pt called stating that PA need to be start for NOVOLOG FLEXPEN 100 UNIT/ML FlexPen. Please help

## 2016-02-29 NOTE — Telephone Encounter (Signed)
Called pt in regards. Let him know that he needs to make an appointment with Tammy SoursGreg has not had diabetes checked or been seen by Tammy SoursGreg in over a year. Pt states he will call back to set something up.

## 2016-05-02 ENCOUNTER — Other Ambulatory Visit: Payer: Self-pay | Admitting: Pulmonary Disease

## 2016-06-12 ENCOUNTER — Other Ambulatory Visit: Payer: Self-pay | Admitting: Pulmonary Disease

## 2016-11-04 ENCOUNTER — Other Ambulatory Visit: Payer: Self-pay | Admitting: Pulmonary Disease

## 2016-11-15 ENCOUNTER — Encounter: Payer: Self-pay | Admitting: Pulmonary Disease

## 2016-11-15 ENCOUNTER — Ambulatory Visit (INDEPENDENT_AMBULATORY_CARE_PROVIDER_SITE_OTHER): Payer: 59 | Admitting: Pulmonary Disease

## 2016-11-15 DIAGNOSIS — G4733 Obstructive sleep apnea (adult) (pediatric): Secondary | ICD-10-CM

## 2016-11-15 DIAGNOSIS — I2602 Saddle embolus of pulmonary artery with acute cor pulmonale: Secondary | ICD-10-CM | POA: Diagnosis not present

## 2016-11-15 MED ORDER — RIVAROXABAN 10 MG PO TABS: 10.0000 mg | ORAL_TABLET | Freq: Every day | ORAL | 5 refills | Status: DC

## 2016-11-15 NOTE — Assessment & Plan Note (Signed)
Sure decision making-we discussed options including stopping anticoagulation altogether. I think his risk factor is obesity and since he has not lost any weight or personal preference is to Continue on Xarelto-decrease to 10 mg daily.

## 2016-11-15 NOTE — Progress Notes (Signed)
   Subjective:    Patient ID: Jordan Cline, male    DOB: December 18, 1975, 41 y.o.   MRN: [id number]  HPI   41 yo male , previous smoker for FU of OSA & h/o PE   admitted 03/16/14  with submassive PE with RV strain on CT angiogram, right DVT-  underwent catheter directed thrombolysis.  Only risk factor is obesity  He has tolerated Xarelto very well over the last 2 years with minimal side effects. His last visit was year and half ago.  Unfortunately he is not very compliant with his CPAP machine, nasal mask. No specific reason, no problems with mask at pressure, just states the inconvenience of using the machine  He has been unable to lose any weight   Significant tests/ events  Echo 03/2014 showed .Mild LVH, grade 1 diastolic dysfunction, RV moderately dilated with decreased systolic function. ? Bubble study difficult , possibly positive suggesting PFO.   CT chest angio 03/2014 >Saddle embolus would large emboli extending into both lower lobes. Emboli also noted in both upper lobes. Possible early lingular  pulmonary infarct.     hypercoag panel neg  HST 3/ 2016>  AHI of 85    Review of Systems Patient denies significant dyspnea,cough, hemoptysis,  chest pain, palpitations, pedal edema, orthopnea, paroxysmal nocturnal dyspnea, lightheadedness, nausea, vomiting, abdominal or  leg pains      Objective:   Physical Exam   Gen. Pleasant, obese, in no distress ENT - no lesions, no post nasal drip Neck: No JVD, no thyromegaly, no carotid bruits Lungs: no use of accessory muscles, no dullness to percussion, decreased without rales or rhonchi  Cardiovascular: Rhythm regular, heart sounds  normal, no murmurs or gallops, no peripheral edema Musculoskeletal: No deformities, no cyanosis or clubbing , no tremors      Assessment & Plan:

## 2016-11-15 NOTE — Addendum Note (Signed)
Addended by: Maurene Capes on: 11/15/2016 11:06 AM   Modules accepted: Orders

## 2016-11-15 NOTE — Patient Instructions (Signed)
Continue on Xarelto-decrease to 10 mg daily.  Get back to using your CPAP machine every night. Check report in one month

## 2016-11-15 NOTE — Assessment & Plan Note (Signed)
We discussed about OSA as a cardiovascular risk factor was initially given his history of diabetes  Get back to using your CPAP machine every night. Check report in one month  Weight loss encouraged, compliance with goal of at least 4-6 hrs every night is the expectation. Advised against medications with sedative side effects Cautioned against driving when sleepy - understanding that sleepiness will vary on a day to day basis

## 2016-12-12 IMAGING — XA IR THROMB F/U EVAL ART/VEN FINAL DAY
1 series · 3 of 3 positions shown · non-contrast
Comparison: none

CLINICAL DATA: 38-year-old with bilateral pulmonary emboli.
Follow-up bilateral pulmonary artery 12 hr tPA infusion.

[Series 1: run · 3 of 3 slices shown]
[im 1/3]
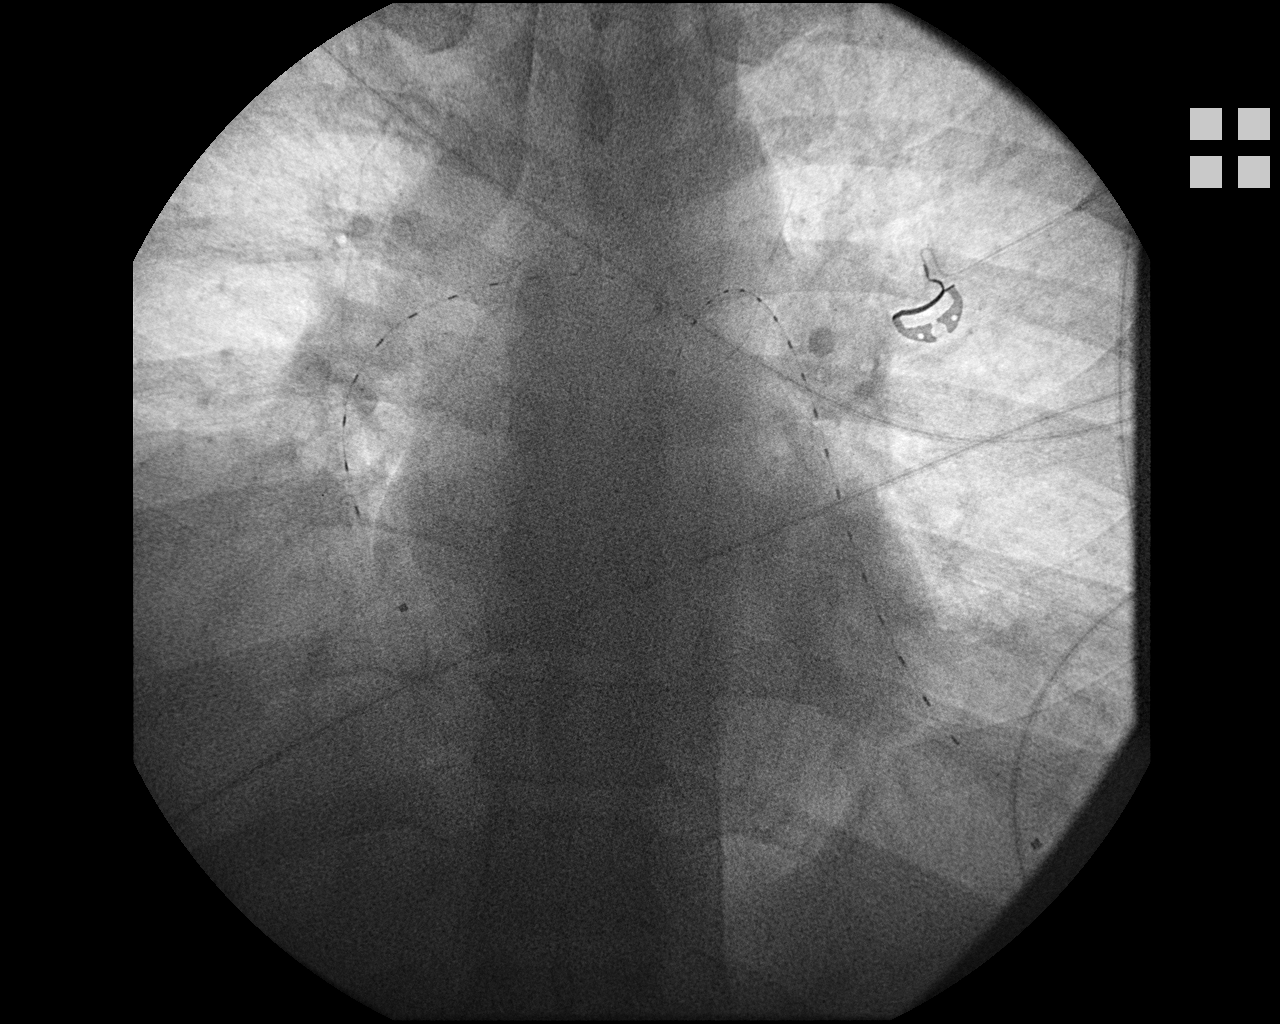
[im 2/3]
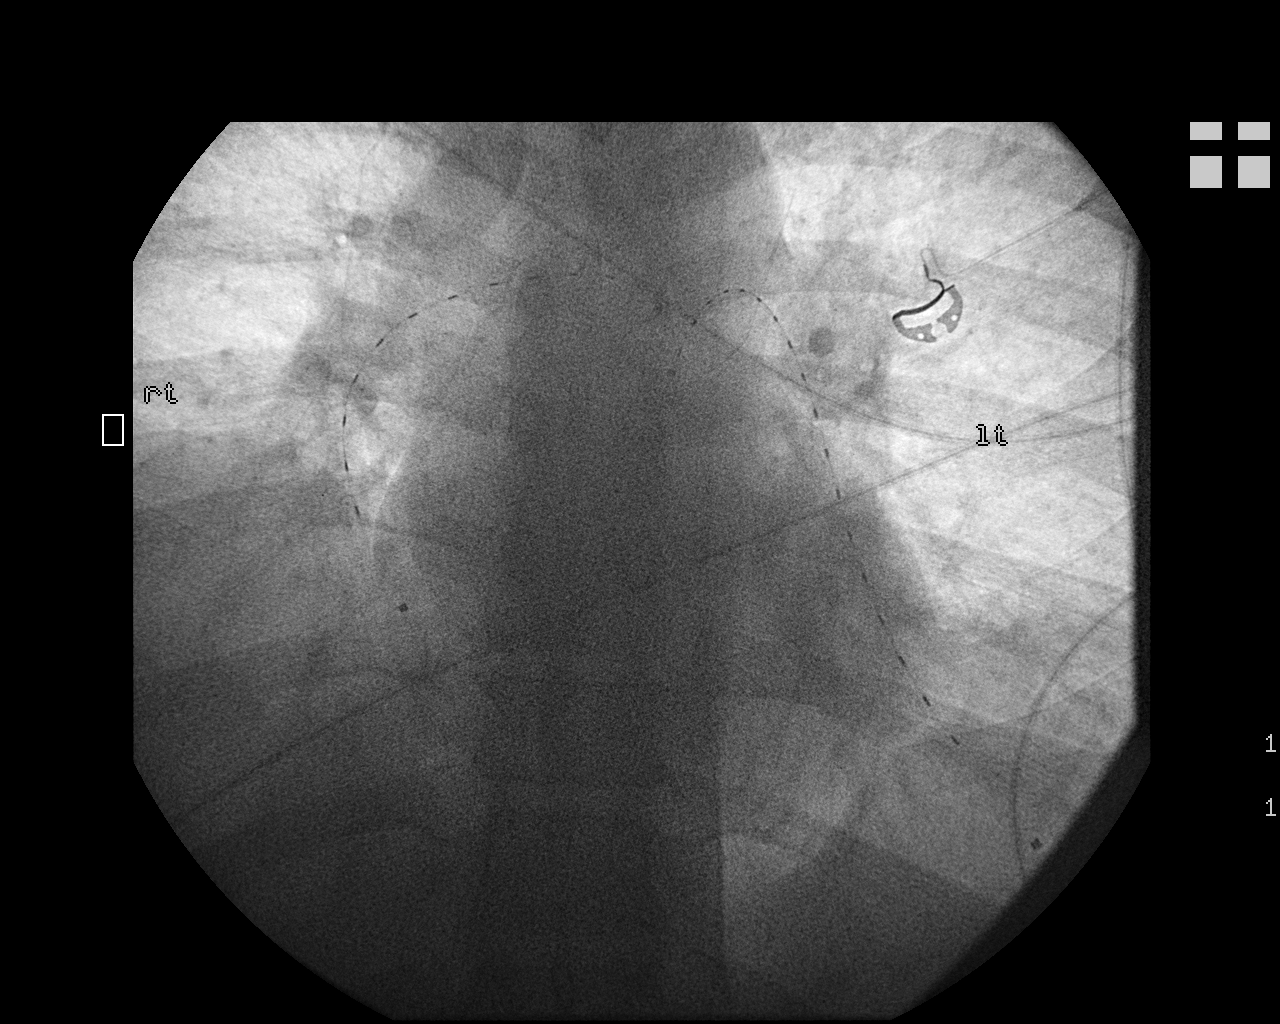
[im 3/3]
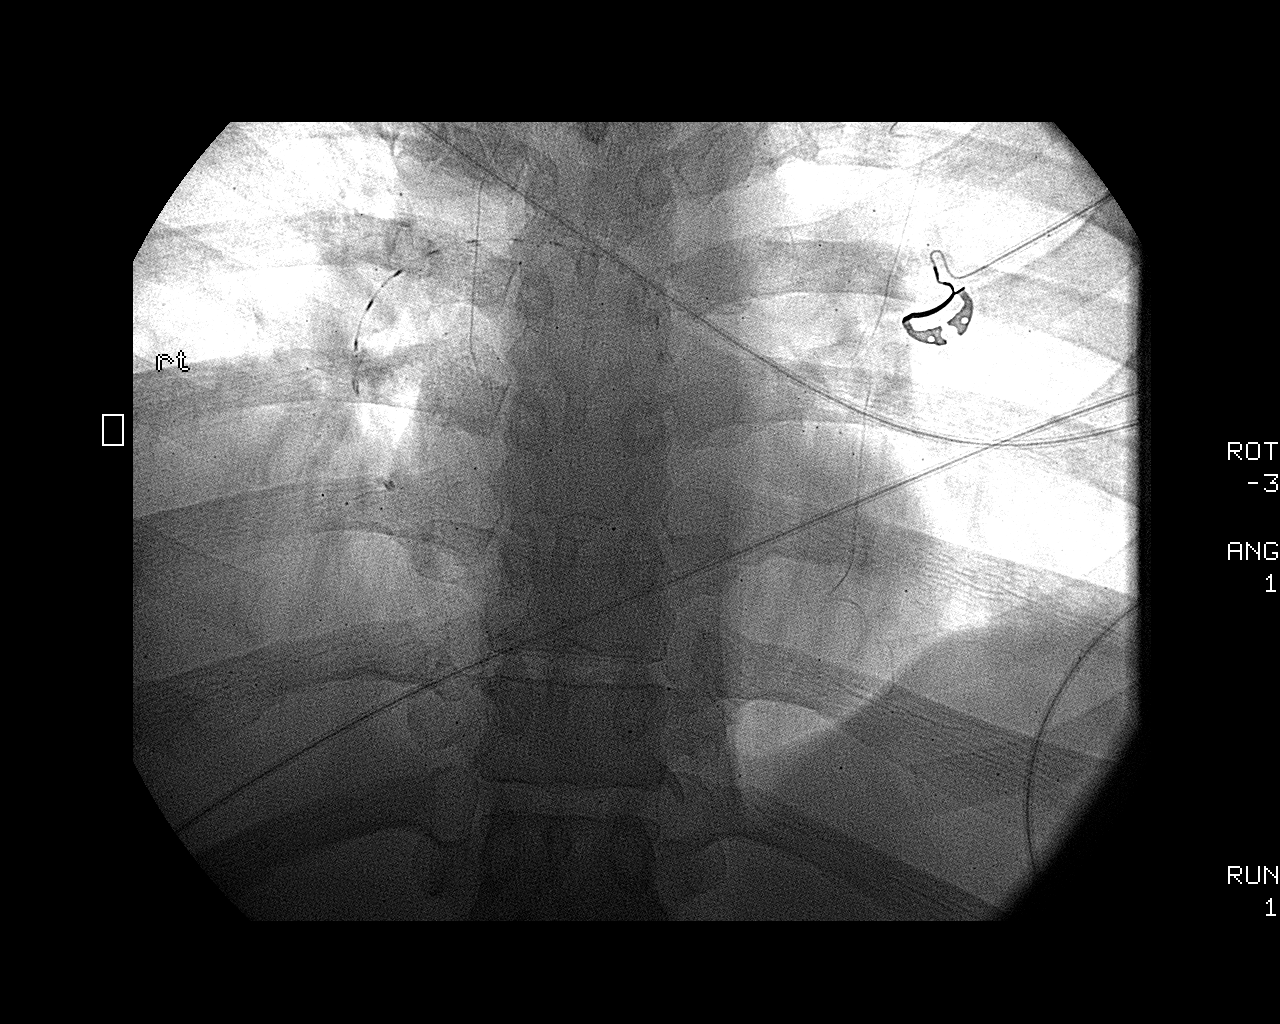

[3 of 3 positions shown; findings below may reference images not displayed]

EXAM:
FOLLOW-UP THROMBOLYTIC THERAPY WITH FLUOROSCOPY

FLUOROSCOPY TIME:  48 seconds, 29.2 mGy

MEDICATIONS:
None

ANESTHESIA/SEDATION:
Moderate sedation time: None

PROCEDURE:
Patient was brought into the interventional suite. The wire was
removed from the left pulmonary artery infusion catheter. The left
pulmonary artery infusion catheter was connected to a transducer.
Catheter was pulled back into the main left pulmonary artery.
Catheter was further pulled back into the main pulmonary artery.
Pressures were obtained from the left pulmonary artery catheter. The
entire catheter was removed. The right pulmonary catheter was
removed. Both right venous sheath were removed with manual
compression. Manual compression was applied to the puncture sites.
Patient was transferred back to the intensive care unit.
FINDINGS: Left pulmonary artery pressure: 47/33 mmHg, mean 39

Main pulmonary artery:  49/28 mmHg, mean 37

Stable position of the bilateral pulmonary artery catheters.
Catheters are located in the lower lobe branches.

COMPLICATIONS:
None
IMPRESSION: Completion of the catheter-directed thrombolysis for bilateral
pulmonary emboli.

Minimal decrease in the pulmonary artery pressures following the
thrombolytic infusion.

Removal of the pulmonary artery catheters and right groin sheaths.

## 2016-12-15 IMAGING — CR DG CHEST 2V
2 series · 2 of 2 positions shown · non-contrast
Comparison: 03/16/2014

CLINICAL DATA: Productive cough, left chest tightness

EXAM:
CHEST  2 VIEW

[chest pa]
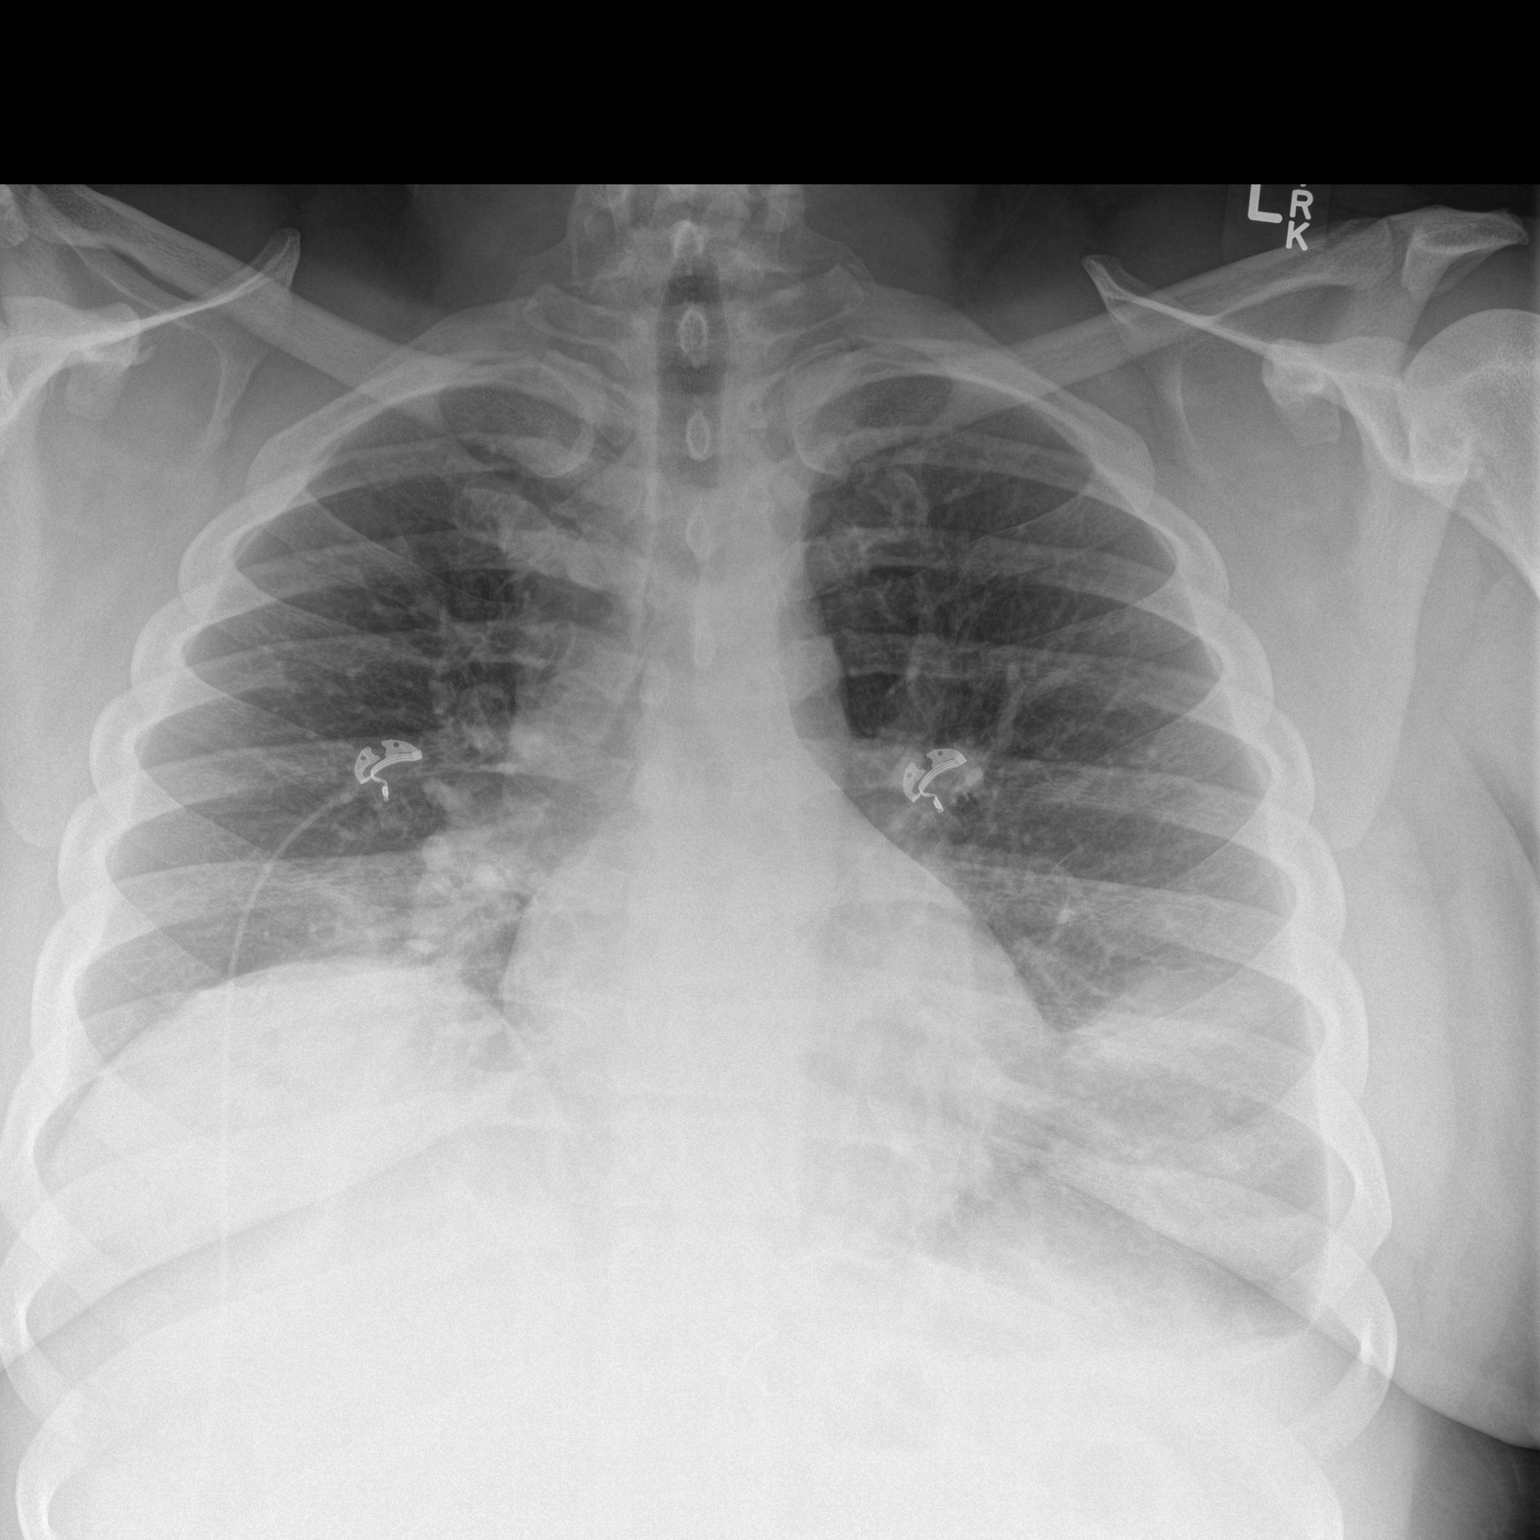

[chest lat]
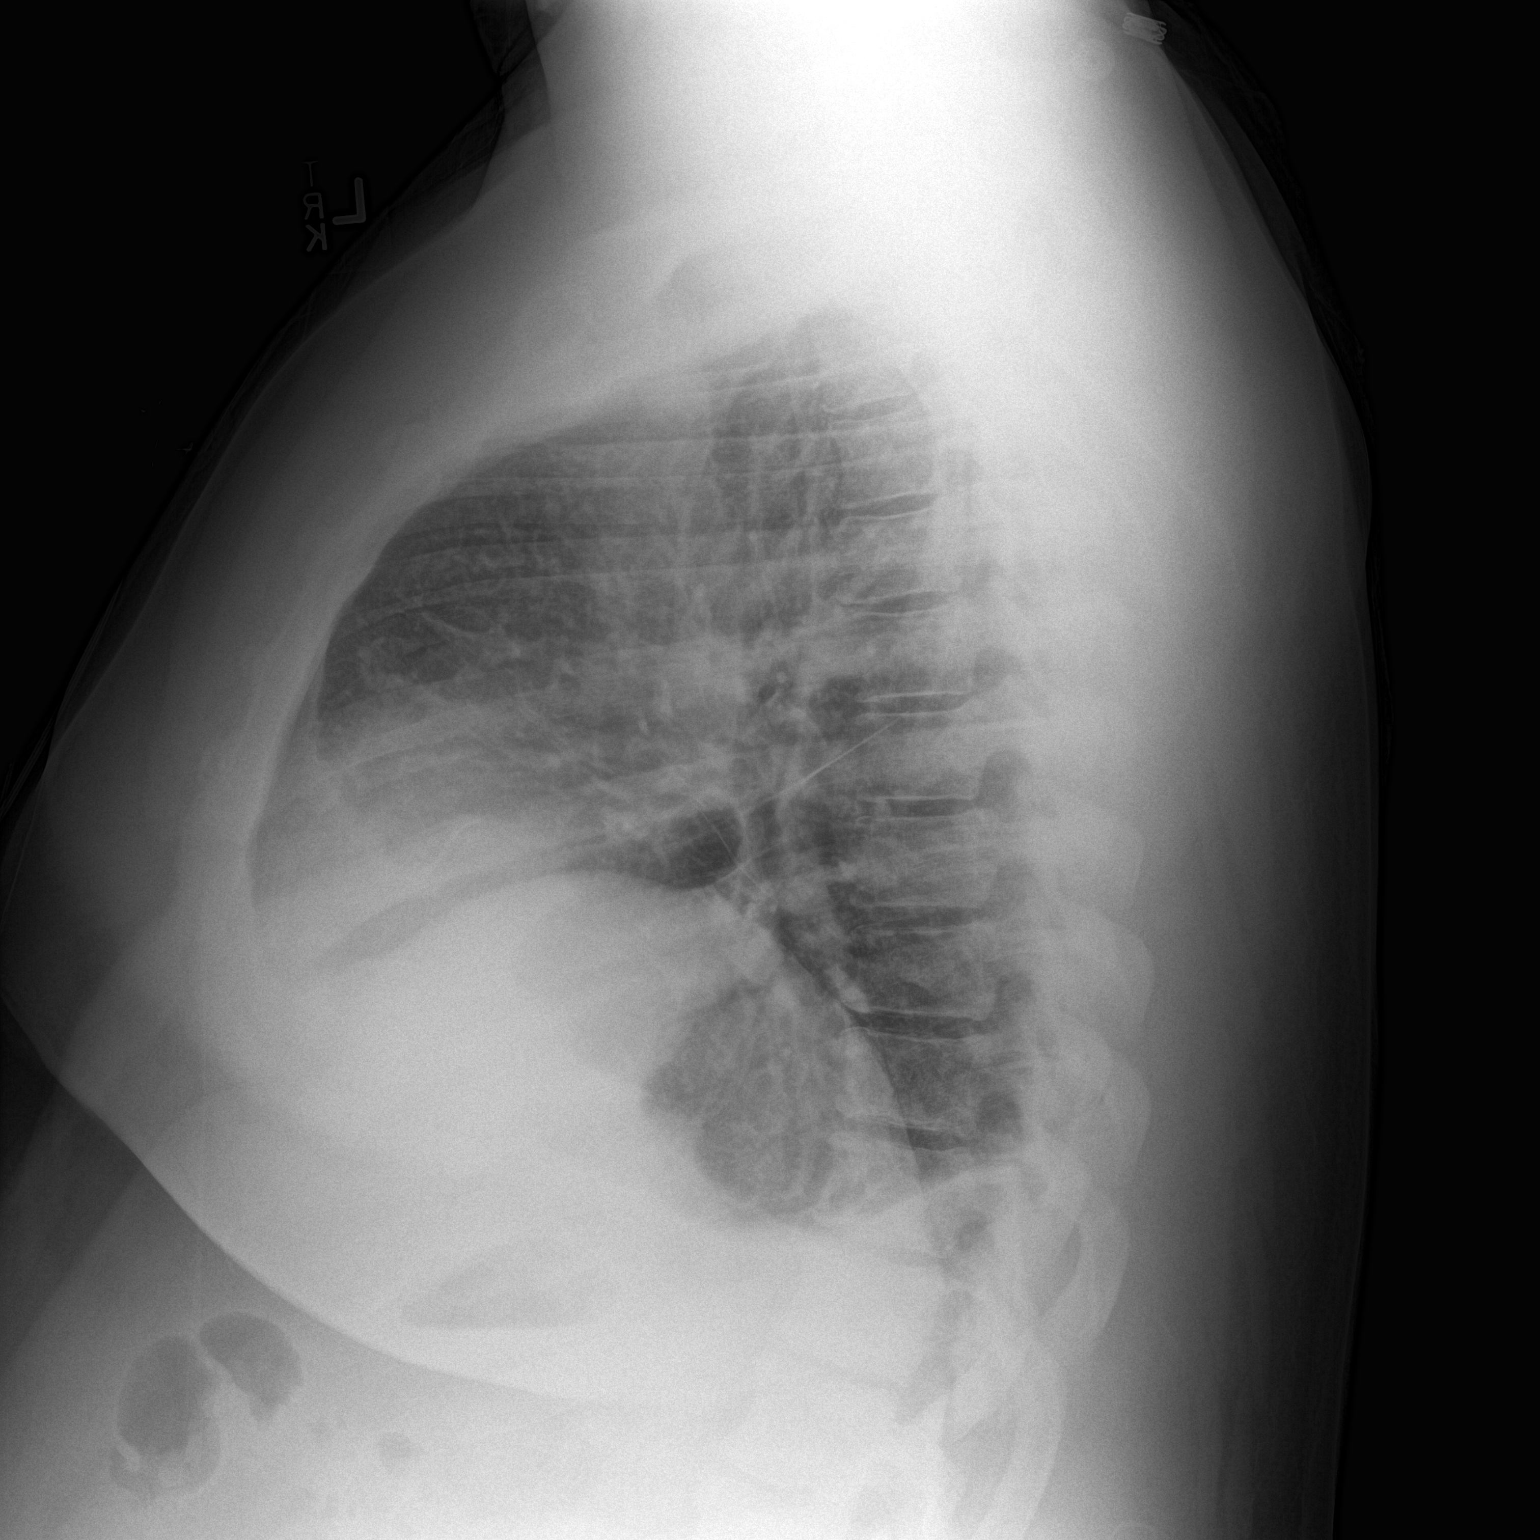

[2 of 2 positions shown; findings below may reference images not displayed]

FINDINGS: Cardiomediastinal silhouette is stable. Central mild vascular
congestion without convincing pulmonary edema. There is elevation of
the right hemidiaphragm again noted. There is hazy atelectasis or
infiltrate in lingula and left base.
IMPRESSION: No convincing pulmonary edema. Hazy atelectasis or infiltrate in
lingula and left base. Chronic elevation of the right hemidiaphragm.

## 2017-06-20 ENCOUNTER — Other Ambulatory Visit: Payer: Self-pay | Admitting: Pulmonary Disease

## 2017-07-13 ENCOUNTER — Encounter: Payer: Self-pay | Admitting: Nurse Practitioner

## 2017-07-13 ENCOUNTER — Encounter

## 2017-07-13 ENCOUNTER — Other Ambulatory Visit (INDEPENDENT_AMBULATORY_CARE_PROVIDER_SITE_OTHER): Payer: 59

## 2017-07-13 ENCOUNTER — Ambulatory Visit (INDEPENDENT_AMBULATORY_CARE_PROVIDER_SITE_OTHER): Payer: 59 | Admitting: Nurse Practitioner

## 2017-07-13 VITALS — BP 160/98 | HR 78 | Temp 98.5°F | Resp 18 | Ht 72.0 in | Wt 328.0 lb

## 2017-07-13 DIAGNOSIS — R030 Elevated blood-pressure reading, without diagnosis of hypertension: Secondary | ICD-10-CM

## 2017-07-13 DIAGNOSIS — E1122 Type 2 diabetes mellitus with diabetic chronic kidney disease: Secondary | ICD-10-CM

## 2017-07-13 LAB — COMPREHENSIVE METABOLIC PANEL
ALBUMIN: 4.1 g/dL (ref 3.5–5.2)
ALT: 46 U/L (ref 0–53)
AST: 26 U/L (ref 0–37)
Alkaline Phosphatase: 70 U/L (ref 39–117)
BILIRUBIN TOTAL: 0.3 mg/dL (ref 0.2–1.2)
BUN: 7 mg/dL (ref 6–23)
CALCIUM: 9.4 mg/dL (ref 8.4–10.5)
CHLORIDE: 104 meq/L (ref 96–112)
CO2: 27 meq/L (ref 19–32)
CREATININE: 0.87 mg/dL (ref 0.40–1.50)
GFR: 124.36 mL/min (ref >60.00)
Glucose, Bld: 159 mg/dL — ABNORMAL HIGH (ref 70–99)
Potassium: 4 mEq/L (ref 3.5–5.1)
SODIUM: 139 meq/L (ref 135–145)
Total Protein: 7.4 g/dL (ref 6.0–8.3)

## 2017-07-13 LAB — MICROALBUMIN / CREATININE URINE RATIO
Creatinine,U: 67.8 mg/dL
MICROALB UR: 14.6 mg/dL — AB (ref 0.0–1.9)
Microalb Creat Ratio: 21.5 mg/g (ref 0.0–30.0)

## 2017-07-13 LAB — POCT GLYCOSYLATED HEMOGLOBIN (HGB A1C): HEMOGLOBIN A1C: 9.4 % — AB (ref 4.0–5.6)

## 2017-07-13 MED ORDER — INSULIN GLARGINE 100 UNIT/ML SOLOSTAR PEN: 10.0000 [IU] | PEN_INJECTOR | Freq: Every day | SUBCUTANEOUS | 9 refills | Status: DC

## 2017-07-13 MED ORDER — METFORMIN HCL 500 MG PO TABS: 500.0000 mg | ORAL_TABLET | Freq: Two times a day (BID) | ORAL | 1 refills | Status: DC

## 2017-07-13 NOTE — Assessment & Plan Note (Signed)
BP elevated today with no recent past readings available and reportedly normal readings at home We discussed keeping a BP log for 1 month and returning for F/U Will consider medication options to treat HTN at F/U visit if BP remains elevated, possibly ACE/ARB for renal protection - Comprehensive metabolic panel; Future

## 2017-07-13 NOTE — Patient Instructions (Addendum)
Please head downstairs for lab work/x-rays. If any of your test results are critically abnormal, you will be contacted right away. Your results may be released to your MyChart for viewing before I am able to provide you with my response. I will contact you within a week about your test results and any recommendations for abnormalities.  Please start metformin 500 mg once daily for 1 week, then increase to twice daily.  I have sent a refill of your lantus. You will begin with 10 units tonight and tomorrow night. After tomorrow night, you will begin checking a fasting blood sugar every morning.  You will add 2 units to your nightly levemir dose each 2 nights until your fasting blood sugars are reading around 120. Each levemir pen can be kept out of the refrigerator for 30 days, but you must store the unused ones in the refrigerator.  I have placed a referral to endocrinology . Our office will call you to schedule this appointment. You should hear from our office in 7-10 days.  Please try to check your blood pressure once daily or at least a few times a week, at the same time each day, and keep a log. Please return in about 1 month with your home blood pressure readings. We can do your annual physical and update routine lab work at this visit

## 2017-07-13 NOTE — Assessment & Plan Note (Signed)
A1c 9.4 today, will restart lantus but not novolog Will also start metformin  Dosing and side effects of lantus and metformin were discussed Referral to endocrinology per patient request - Ambulatory referral to Endocrinology - Insulin Glargine (LANTUS SOLOSTAR) 100 UNIT/ML Solostar Pen; Inject 10 Units into the skin daily at 10 pm.  Dispense: 3 mL; Refill: 9 - Comprehensive metabolic panel; Future - Microalbumin / creatinine urine ratio; Future - metFORMIN (GLUCOPHAGE) 500 MG tablet; Take 1 tablet (500 mg total) by mouth 2 (two) times daily with a meal.  Dispense: 60 tablet; Refill: 1 - POCT HgB A1C-9.4

## 2017-07-13 NOTE — Progress Notes (Signed)
Name: Jordan Cline   MRN: [id number]    DOB: 1975-08-23   Date:07/13/2017       Progress Note  Subjective/  Chief Complaint  Chief Complaint  Patient presents with  . Establish Care    Diabetes has not had medication     HPI Jordan Cline is here today to establish care with me. He is also followed routinely by pulmonology for pulmonary embolus and OSA. He is requesting a refill of his diabetes medications today, he says he has been out of these medications for over 1 year. We will also discuss his elevated blood pressure reading. He was last seen by his PCP here on 09/22/14. He denies any complaints today, says aside from a cold he overall feels well.  Diabetes-he's not had his lantus in 3 months, novolog in 1 year. Prior to 3 months ago, he was only taking his lantus occasionally, to try to make it last longer and to see how he would do without it He says he has made a conscious effort to eat better to improve his glucose control Reports he has occasionally been checking his blood sugars with readings around 170s-200s at home Denies tremor, diaphoresis, polyuria, polydipsia, polyphagia. He actually wanted to see an endocrinologist for his diabetes in the past, but was never referred and is still interested in referral   Lab Results  Component Value Date   HGBA1C 7.2 (H) 09/22/2014    Hypertension -history of elevated blood pressure readings, but has not been maintained on medications for HTN. He feels his blood pressure reading is elevated today because he has been taking OTC medications for cold symptoms, and tells me that he occasionally checks his BP at home with readings in the 409W systolic. Denies headaches, vision changes, chest pain, shortness of breath, edema.  BP Readings from Last 3 Encounters:  07/13/17 (!) 160/98  11/15/16 124/80  10/04/15 128/84      Patient Active Problem List   Diagnosis Date Noted  . Erectile dysfunction associated with type 2 diabetes  mellitus ([place]) 09/22/2014  . Type II diabetes mellitus ([place]) 05/13/2014  . OSA (obstructive sleep apnea) 05/13/2014  . Excessive daytime sleepiness 04/20/2014  . PFO (patent foramen ovale) 03/19/2014  . Borderline hypertension   . 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  Family History  Problem Relation Age of Onset  . Arrhythmia Mother   . Breast cancer Mother   . Hyperlipidemia Mother   . Diabetes Father   . Hypertension Father   . Cancer Maternal Uncle   . Cancer Maternal Grandfather   . Breast cancer Maternal Grandmother   . Stroke Paternal Grandmother     Social History   Socioeconomic History  . Marital status: Significant Other    Spouse name: Not on file  . Number of children: 0  . Years of education: 9  . Highest education level: Not on file  Occupational History  . Occupation: [employer]  Social Needs  . Financial resource strain: Not on file  . Food insecurity:    Worry: Not on file    Inability: Not on file  . Transportation needs:    Medical: Not on file    Non-medical: Not on file  Tobacco Use  . Smoking status: Former Smoker    Packs/day: 0.00    Years: 12.00    Pack years: 0.00    Types: Cigarettes  Last attempt to quit: 02/13/2014    Years since quitting: 3.4  . Smokeless tobacco: Never Used  Substance and Sexual Activity  . Alcohol use: Yes    Alcohol/week: 0.0 oz    Comment: occasionallly  . Drug use: No  . Sexual activity: Not on file  Lifestyle  . Physical activity:    Days per week: Not on file    Minutes per session: Not on file  . Stress: Not on file  Relationships  . Social connections:    Talks on phone: Not on file    Gets together: Not on file    Attends religious service: Not on file    Active member of club or organization: Not on file    Attends meetings of clubs or organizations: Not on file    Relationship status: Not on file  . Intimate partner  violence:    Fear of current or ex partner: Not on file    Emotionally abused: Not on file    Physically abused: Not on file    Forced sexual activity: Not on file  Other Topics Concern  . Not on file  Social History Narrative   Getting married on June 11th to his fiance. Fun: Outdoors, traveling, movies, some sports.    Denies any religious beliefs effecting health care.      Current Outpatient Medications:  .  blood glucose meter kit and supplies KIT, Dispense based on patient and insurance preference. Use up to four times daily as directed. (FOR ICD-9 250.00, 250.01)., Disp: 1 each, Rfl: 0 .  folic acid (FOLVITE) 1 MG tablet, TAKE 1 TABLET BY MOUTH EVERY DAY, Disp: 90 tablet, Rfl: 2 .  glucose blood (ACCU-CHEK AVIVA PLUS) test strip, USE AS DIRECTED 4 TIMES A DAY, Disp: 100 each, Rfl: 2 .  Insulin Pen Needle (B-D UF III MINI PEN NEEDLES) 31G X 5 MM MISC, 1 Units by Does not apply route once., Disp: 100 each, Rfl: 3 .  LANTUS SOLOSTAR 100 UNIT/ML Solostar Pen, INJECT 20 UNITS INTO SKIN EVERY DAY AT 10 PM, Disp: 3 mL, Rfl: 9 .  NOVOLOG FLEXPEN 100 UNIT/ML FlexPen, INJECT 4 UNITS INTO SKIN 3 TIMES DAILY WITH MEALS, Disp: 3 mL, Rfl: 3 .  sildenafil (REVATIO) 20 MG tablet, TAKE 1-5 TABLETS BY MOUTH DAILY AS NEEDED FOR ERICTILE DYSFUNCTION., Disp: 25 tablet, Rfl: 0 .  XARELTO 10 MG TABS tablet, TAKE 1 TABLET BY MOUTH EVERY DAY, Disp: 30 tablet, Rfl: 5  No Known Allergies   ROS See HPI  Objective  Vitals:   07/13/17 1351  BP: (!) 160/98  Pulse: 78  Resp: 18  Temp: 98.5 F (36.9 C)  TempSrc: Oral  SpO2: 97%  Weight: (!) 328 lb (148.8 kg)  Height: 6' (1.829 m)    Body mass index is 44.48 kg/m.  Physical Exam Vital signs reviewed. Constitutional: Patient appears well-developed and well-nourished. Obese. No distress.  HENT: Head: Normocephalic and atraumatic. Nose: Nose normal. Mouth/Throat: Oropharynx is clear and moist. No oropharyngeal exudate.  Eyes: Conjunctivae and  EOM are normal. Pupils are equal, round, and reactive to light. No scleral icterus.  Neck: Normal range of motion. Neck supple.  Cardiovascular: Normal rate, regular rhythm and normal heart sounds.  No murmur heard. No BLE edema. Distal pulses intact. Pulmonary/Chest: Effort normal and breath sounds normal. No respiratory distress. Musculoskeletal: Normal range of motion. No gross deformities Neurological: He is alert and oriented to person, place, and time. Coordination, balance, strength, speech and  gait are normal.  Skin: Skin is warm and dry. No rash noted. No erythema.  Psychiatric: Patient has a normal mood and affect. behavior is normal. Judgment and thought content normal.   Assessment & Plan RTC in 1 month for CPE,  F/U: HTN-bring home log, recheck BP  -Reviewed Health Maintenance:  - POCT HgB A1C - Microalbumin / creatinine urine ratio; Future

## 2017-07-19 ENCOUNTER — Other Ambulatory Visit: Payer: Self-pay | Admitting: Nurse Practitioner

## 2017-07-19 DIAGNOSIS — E118 Type 2 diabetes mellitus with unspecified complications: Secondary | ICD-10-CM

## 2017-07-19 DIAGNOSIS — R030 Elevated blood-pressure reading, without diagnosis of hypertension: Secondary | ICD-10-CM

## 2017-07-19 MED ORDER — LISINOPRIL 10 MG PO TABS: 10.0000 mg | ORAL_TABLET | Freq: Every day | ORAL | 1 refills | Status: DC

## 2017-07-19 NOTE — Progress Notes (Signed)
orders

## 2017-08-09 ENCOUNTER — Ambulatory Visit: Payer: 59 | Admitting: Nurse Practitioner

## 2017-08-17 ENCOUNTER — Other Ambulatory Visit: Payer: Self-pay

## 2017-08-17 MED ORDER — INSULIN PEN NEEDLE 31G X 5 MM MISC: 1.0000 [IU] | Freq: Once | 3 refills | Status: AC

## 2017-08-23 ENCOUNTER — Ambulatory Visit (INDEPENDENT_AMBULATORY_CARE_PROVIDER_SITE_OTHER): Payer: 59 | Admitting: Nurse Practitioner

## 2017-08-23 ENCOUNTER — Encounter: Payer: Self-pay | Admitting: Nurse Practitioner

## 2017-08-23 VITALS — BP 134/78 | HR 91 | Temp 99.3°F | Resp 16 | Ht 72.0 in | Wt 323.0 lb

## 2017-08-23 DIAGNOSIS — R350 Frequency of micturition: Secondary | ICD-10-CM

## 2017-08-23 DIAGNOSIS — Z23 Encounter for immunization: Secondary | ICD-10-CM

## 2017-08-23 DIAGNOSIS — Z114 Encounter for screening for human immunodeficiency virus [HIV]: Secondary | ICD-10-CM

## 2017-08-23 DIAGNOSIS — Z13220 Encounter for screening for lipoid disorders: Secondary | ICD-10-CM

## 2017-08-23 DIAGNOSIS — Z0000 Encounter for general adult medical examination without abnormal findings: Secondary | ICD-10-CM

## 2017-08-23 DIAGNOSIS — E1122 Type 2 diabetes mellitus with diabetic chronic kidney disease: Secondary | ICD-10-CM | POA: Diagnosis not present

## 2017-08-23 DIAGNOSIS — Z1211 Encounter for screening for malignant neoplasm of colon: Secondary | ICD-10-CM | POA: Insufficient documentation

## 2017-08-23 DIAGNOSIS — I10 Essential (primary) hypertension: Secondary | ICD-10-CM | POA: Diagnosis not present

## 2017-08-23 DIAGNOSIS — R69 Illness, unspecified: Secondary | ICD-10-CM | POA: Diagnosis not present

## 2017-08-23 MED ORDER — INSULIN GLARGINE 100 UNIT/ML SOLOSTAR PEN: 18.0000 [IU] | PEN_INJECTOR | Freq: Every day | SUBCUTANEOUS | 9 refills | Status: DC

## 2017-08-23 NOTE — Assessment & Plan Note (Signed)
-  USPSTF grade A and B recommendations reviewed with patient; age-appropriate recommendations, preventive care, screening tests, etc discussed and encouraged; healthy living encouraged; see AVS for patient education given to patient -Discussed importance of regular physical activity and healthy diet, eat 6 servings of fruit/vegetables daily and drink plenty of water and avoid sweet beverages.  -Follow up and care instructions discussed and provided in AVS.   -Reviewed Health Maintenance:  Need for Tdap vaccination- Tdap vaccine greater than or equal to 7yo IM Screening for HIV (human immunodeficiency virus)-- HIV antibody; Future  Screening for cholesterol level He will return for labs when fasting - Lipid panel; Future

## 2017-08-23 NOTE — Assessment & Plan Note (Signed)
Glucose readings have improved on metformin and lantus, will continue at current dosages RTC in 1 month for F/u, repeat A1c He was referred to endocrinology at last OV in May but says now he would prefer not to go, I will recheck his A1c next month and re-evaluate need for referral  - Lipid panel; Future - TSH; Future - Insulin Glargine (LANTUS SOLOSTAR) 100 UNIT/ML Solostar Pen; Inject 18 Units into the skin daily at 10 pm.  Dispense: 3 mL; Refill: 9

## 2017-08-23 NOTE — Progress Notes (Signed)
Name: Jordan Cline   MRN: [id number]    DOB: 06-03-1975   Date:08/23/2017       Progress Note  Subjective  Chief Complaint  Chief Complaint  Patient presents with  . CPE    not fasting    HPI  Patient presents for annual CPE.  USPSTF grade A and B recommendations:  Diet, Exercise: walking on lunch breaks, trying to make healthier food choices  Depression: denies anxiety or depression Depression screen [facility] 2/9 04/24/2014  Decreased Interest 0  Down, Depressed, Hopeless 0  PHQ - 2 Score 0   Hypertension -started on lisinopril 10 daily after last OV on 5/31 for elevated blood pressure and for elevated microalbumin on labwork. He has started the lisinopril daily without any noted adverse reactions. He has been checking blood pressure routinely at home, with recent readings upper 130s/70s.  BP Readings from Last 3 Encounters:  08/23/17 134/78  07/13/17 (!) 160/98  11/15/16 124/80   Obesity: Wt Readings from Last 3 Encounters:  08/23/17 (!) 323 lb (146.5 kg)  07/13/17 (!) 328 lb (148.8 kg)  11/15/16 (!) 327 lb 9.6 oz (148.6 kg)   BMI Readings from Last 3 Encounters:  08/23/17 43.81 kg/m  07/13/17 44.48 kg/m  11/15/16 44.43 kg/m    Lipids: lipid panel today No results found for: CHOL No results found for: HDL No results found for: LDLCALC No results found for: TRIG No results found for: CHOLHDL No results found for: LDLDIRECT  Diabetes-  restarted on lantus and metformin on 5/31 OV for elevated A1c Reports he initially had some diarrhea when he started the metformin but this has resolved and he otherwise feels he is tolerating the medications well. He has been checking blood sugars at home with readings 120s-130 since resuming his medications and is currently up to 18 units lantus qhs. Denies tremor, diaphoresis, polyuria, polydipsia, polyphagia.  Lab Results  Component Value Date   HGBA1C 9.4 (A) 07/13/2017   Alcohol: social drink on the  weekends Tobacco use: current, thinking of quitting  STD testing and prevention (chl/gon/syphilis): no concerns, declines screening HIV: HIV screening ordered today  Skin cancer: does not routinely wear sunscreen Colorectal cancer: No personal or family history of colon ca, no abdominal pain, no bowel changes, no rectal bleeding.  Prostate cancer: family history of prostate cancer, reports some urinary frequency, nocturia recently, denies dysuria or hematuria, PSA ordered today  Aspirin: not taking ECG:  Not indicated  Vaccinations: TDAP today, declines PNA  Advanced Care Planning: A voluntary discussion about advance care planning including the explanation and discussion of advance directives.  Discussed health care proxy and Living will, and the patient DOES have a living will at present time. If patient does have living will, I have requested they bring this to the clinic to be scanned in to their chart.  Patient Active Problem List   Diagnosis Date Noted  . Erectile dysfunction associated with type 2 diabetes mellitus ([place]) 09/22/2014  . Type II diabetes mellitus ([place]) 05/13/2014  . OSA (obstructive sleep apnea) 05/13/2014  . Excessive daytime sleepiness 04/20/2014  . PFO (patent foramen ovale) 03/19/2014  . Borderline hypertension   . 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  Family History  Problem Relation Age of Onset  . Arrhythmia Mother   . Breast cancer Mother   . Hyperlipidemia Mother   . Diabetes Father   . Hypertension Father   .  Cancer Maternal Uncle   . Cancer Maternal Grandfather   . Breast cancer Maternal Grandmother   . Stroke Paternal Grandmother     Social History   Socioeconomic History  . Marital status: Significant Other    Spouse name: Not on file  . Number of children: 0  . Years of education: 53  . Highest education level: Not on file  Occupational History  . Occupation:  [employer]  Social Needs  . Financial resource strain: Not on file  . Food insecurity:    Worry: Not on file    Inability: Not on file  . Transportation needs:    Medical: Not on file    Non-medical: Not on file  Tobacco Use  . Smoking status: Former Smoker    Packs/day: 0.00    Years: 12.00    Pack years: 0.00    Types: Cigarettes    Last attempt to quit: 02/13/2014    Years since quitting: 3.5  . Smokeless tobacco: Never Used  Substance and Sexual Activity  . Alcohol use: Yes    Alcohol/week: 0.0 oz    Comment: occasionallly  . Drug use: No  . Sexual activity: Not on file  Lifestyle  . Physical activity:    Days per week: Not on file    Minutes per session: Not on file  . Stress: Not on file  Relationships  . Social connections:    Talks on phone: Not on file    Gets together: Not on file    Attends religious service: Not on file    Active member of club or organization: Not on file    Attends meetings of clubs or organizations: Not on file    Relationship status: Not on file  . Intimate partner violence:    Fear of current or ex partner: Not on file    Emotionally abused: Not on file    Physically abused: Not on file    Forced sexual activity: Not on file  Other Topics Concern  . Not on file  Social History Narrative   Getting married on June 11th to his fiance. Fun: Outdoors, traveling, movies, some sports.    Denies any religious beliefs effecting health care.      Current Outpatient Medications:  .  blood glucose meter kit and supplies KIT, Dispense based on patient and insurance preference. Use up to four times daily as directed. (FOR ICD-9 250.00, 250.01)., Disp: 1 each, Rfl: 0 .  folic acid (FOLVITE) 1 MG tablet, TAKE 1 TABLET BY MOUTH EVERY DAY, Disp: 90 tablet, Rfl: 2 .  glucose blood (ACCU-CHEK AVIVA PLUS) test strip, USE AS DIRECTED 4 TIMES A DAY, Disp: 100 each, Rfl: 2 .  Insulin Glargine (LANTUS SOLOSTAR) 100 UNIT/ML Solostar Pen, Inject 10  Units into the skin daily at 10 pm., Disp: 3 mL, Rfl: 9 .  lisinopril (PRINIVIL,ZESTRIL) 10 MG tablet, Take 1 tablet (10 mg total) by mouth daily., Disp: 30 tablet, Rfl: 1 .  metFORMIN (GLUCOPHAGE) 500 MG tablet, Take 1 tablet (500 mg total) by mouth 2 (two) times daily with a meal., Disp: 60 tablet, Rfl: 1 .  sildenafil (REVATIO) 20 MG tablet, TAKE 1-5 TABLETS BY MOUTH DAILY AS NEEDED FOR ERICTILE DYSFUNCTION., Disp: 25 tablet, Rfl: 0 .  XARELTO 10 MG TABS tablet, TAKE 1 TABLET BY MOUTH EVERY DAY, Disp: 30 tablet, Rfl: 5  No Known Allergies   ROS  Constitutional: Negative for fever or weight change.  Respiratory: Negative for cough  and shortness of breath.   Cardiovascular: Negative for chest pain or palpitations.  Gastrointestinal: Negative for abdominal pain, no bowel changes.  Musculoskeletal: Negative for gait problem or joint swelling.  Skin: Negative for rash.  Neurological: Negative for dizziness or headache.  No other specific complaints in a complete review of systems (except as listed in HPI above).   Objective  Vitals:   08/23/17 1458  BP: 134/78  Pulse: 91  Resp: 16  Temp: 99.3 F (37.4 C)  TempSrc: Oral  SpO2: 98%  Weight: (!) 323 lb (146.5 kg)  Height: 6' (1.829 m)    Body mass index is 43.81 kg/m.  Physical Exam Vital signs reviewed. Constitutional: Patient appears well-developed and well-nourished. No distress.  HENT: Head: Normocephalic and atraumatic. Ears: B TMs ok, no erythema or effusion, copious cerumen to bilateral ear canals; Nose: Nose normal. Mouth/Throat: Oropharynx is clear and moist. No oropharyngeal exudate.  Eyes: Conjunctivae and EOM are normal. Pupils are equal, round, and reactive to light. No scleral icterus.  Neck: Normal range of motion. Neck supple. No cervical adenopathy. No thyromegaly present.  Cardiovascular: Normal rate, regular rhythm and normal heart sounds.  No murmur heard. No BLE edema. Pulmonary/Chest: Effort normal and  breath sounds normal. No respiratory distress. Abdominal: Soft, rotund. Bowel sounds are normal, no distension. There is no tenderness. no masses Musculoskeletal: Normal range of motion, No gross deformities Neurological: he is alert and oriented to person, place, and time. No cranial nerve deficit. Coordination, balance, strength, speech and gait are normal.  Skin: Skin is warm and dry. No rash noted. No erythema.  Psychiatric: Patient has a normal mood and affect. behavior is normal. Judgment and thought content normal.  Assessment & Plan RTC in 1 month for F/U: DM- recheck A1C  Urinary frequency Could be related to his uncontrolled diabetes Labs today F/U with further recommendations pending lab results - PSA; Future - Urinalysis, Routine w reflex microscopic; Future

## 2017-08-23 NOTE — Assessment & Plan Note (Signed)
Stable Continue lisinopril at current dosage Continue to monitor - Comprehensive metabolic panel; Future - CBC; Future - Lipid panel; Future - TSH; Future

## 2017-08-23 NOTE — Patient Instructions (Signed)
Please return to the lab downstairs for labwork when fasting- only water or black coffee for 6 hours prior  Please return in about 1 month for diabetes follow up.   Health Maintenance, Male A healthy lifestyle and preventive care is important for your health and wellness. Ask your health care provider about what schedule of regular examinations is right for you. What should I know about weight and diet? Eat a Healthy Diet  Eat plenty of vegetables, fruits, whole grains, low-fat dairy products, and lean protein.  Do not eat a lot of foods high in solid fats, added sugars, or salt.  Maintain a Healthy Weight Regular exercise can help you achieve or maintain a healthy weight. You should:  Do at least 150 minutes of exercise each week. The exercise should increase your heart rate and make you sweat (moderate-intensity exercise).  Do strength-training exercises at least twice a week.  Watch Your Levels of Cholesterol and Blood Lipids  Have your blood tested for lipids and cholesterol every 5 years starting at 41 years of age. If you are at high risk for heart disease, you should start having your blood tested when you are 41 years old. You may need to have your cholesterol levels checked more often if: ? Your lipid or cholesterol levels are high. ? You are older than 41 years of age. ? You are at high risk for heart disease.  What should I know about cancer screening? Many types of cancers can be detected early and may often be prevented. Lung Cancer  You should be screened every year for lung cancer if: ? You are a current smoker who has smoked for at least 30 years. ? You are a former smoker who has quit within the past 15 years.  Talk to your health care provider about your screening options, when you should start screening, and how often you should be screened.  Colorectal Cancer  Routine colorectal cancer screening usually begins at 41 years of age and should be repeated every  5-10 years until you are 41 years old. You may need to be screened more often if early forms of precancerous polyps or small growths are found. Your health care provider may recommend screening at an earlier age if you have risk factors for colon cancer.  Your health care provider may recommend using home test kits to check for hidden blood in the stool.  A small camera at the end of a tube can be used to examine your colon (sigmoidoscopy or colonoscopy). This checks for the earliest forms of colorectal cancer.  Prostate and Testicular Cancer  Depending on your age and overall health, your health care provider may do certain tests to screen for prostate and testicular cancer.  Talk to your health care provider about any symptoms or concerns you have about testicular or prostate cancer.  Skin Cancer  Check your skin from head to toe regularly.  Tell your health care provider about any new moles or changes in moles, especially if: ? There is a change in a mole's size, shape, or color. ? You have a mole that is larger than a pencil eraser.  Always use sunscreen. Apply sunscreen liberally and repeat throughout the day.  Protect yourself by wearing long sleeves, pants, a wide-brimmed hat, and sunglasses when outside.  What should I know about heart disease, diabetes, and high blood pressure?  If you are 3318-41 years of age, have your blood pressure checked every 3-5 years. If you are  91 years of age or older, have your blood pressure checked every year. You should have your blood pressure measured twice-once when you are at a hospital or clinic, and once when you are not at a hospital or clinic. Record the average of the two measurements. To check your blood pressure when you are not at a hospital or clinic, you can use: ? An automated blood pressure machine at a pharmacy. ? A home blood pressure monitor.  Talk to your health care provider about your target blood pressure.  If you are  between 48-45 years old, ask your health care provider if you should take aspirin to prevent heart disease.  Have regular diabetes screenings by checking your fasting blood sugar level. ? If you are at a normal weight and have a low risk for diabetes, have this test once every three years after the age of 28. ? If you are overweight and have a high risk for diabetes, consider being tested at a younger age or more often.  A one-time screening for abdominal aortic aneurysm (AAA) by ultrasound is recommended for men aged 6-75 years who are current or former smokers. What should I know about preventing infection? Hepatitis B If you have a higher risk for hepatitis B, you should be screened for this virus. Talk with your health care provider to find out if you are at risk for hepatitis B infection. Hepatitis C Blood testing is recommended for:  Everyone born from [birth year] through 1964.  Anyone with known risk factors for hepatitis C.  Sexually Transmitted Diseases (STDs)  You should be screened each year for STDs including gonorrhea and chlamydia if: ? You are sexually active and are younger than 41 years of age. ? You are older than 41 years of age and your health care provider tells you that you are at risk for this type of infection. ? Your sexual activity has changed since you were last screened and you are at an increased risk for chlamydia or gonorrhea. Ask your health care provider if you are at risk.  Talk with your health care provider about whether you are at high risk of being infected with HIV. Your health care provider may recommend a prescription medicine to help prevent HIV infection.  What else can I do?  Schedule regular health, dental, and eye exams.  Stay current with your vaccines (immunizations).  Do not use any tobacco products, such as cigarettes, chewing tobacco, and e-cigarettes. If you need help quitting, ask your health care provider.  Limit alcohol intake to no  more than 2 drinks per day. One drink equals 12 ounces of beer, 5 ounces of wine, or 1 ounces of hard liquor.  Do not use street drugs.  Do not share needles.  Ask your health care provider for help if you need support or information about quitting drugs.  Tell your health care provider if you often feel depressed.  Tell your health care provider if you have ever been abused or do not feel safe at home. This information is not intended to replace advice given to you by your health care provider. Make sure you discuss any questions you have with your health care provider. Document Released: 07/29/2007 Document Revised: 09/29/2015 Document Reviewed: 11/03/2014 Elsevier Interactive Patient Education  [employer].

## 2017-08-29 ENCOUNTER — Other Ambulatory Visit (INDEPENDENT_AMBULATORY_CARE_PROVIDER_SITE_OTHER): Payer: 59

## 2017-08-29 DIAGNOSIS — Z0000 Encounter for general adult medical examination without abnormal findings: Secondary | ICD-10-CM | POA: Diagnosis not present

## 2017-08-29 DIAGNOSIS — Z114 Encounter for screening for human immunodeficiency virus [HIV]: Secondary | ICD-10-CM

## 2017-08-29 DIAGNOSIS — Z13220 Encounter for screening for lipoid disorders: Secondary | ICD-10-CM | POA: Diagnosis not present

## 2017-08-29 DIAGNOSIS — E1122 Type 2 diabetes mellitus with diabetic chronic kidney disease: Secondary | ICD-10-CM

## 2017-08-29 DIAGNOSIS — R350 Frequency of micturition: Secondary | ICD-10-CM | POA: Diagnosis not present

## 2017-08-29 DIAGNOSIS — Z125 Encounter for screening for malignant neoplasm of prostate: Secondary | ICD-10-CM | POA: Diagnosis not present

## 2017-08-29 DIAGNOSIS — I10 Essential (primary) hypertension: Secondary | ICD-10-CM | POA: Diagnosis not present

## 2017-08-29 LAB — COMPREHENSIVE METABOLIC PANEL
ALK PHOS: 55 U/L (ref 39–117)
ALT: 23 U/L (ref 0–53)
AST: 20 U/L (ref 0–37)
Albumin: 4.1 g/dL (ref 3.5–5.2)
BILIRUBIN TOTAL: 0.4 mg/dL (ref 0.2–1.2)
BUN: 12 mg/dL (ref 6–23)
CO2: 28 mEq/L (ref 19–32)
Calcium: 9.2 mg/dL (ref 8.4–10.5)
Chloride: 103 mEq/L (ref 96–112)
Creatinine, Ser: 1.05 mg/dL (ref 0.40–1.50)
GFR: 100.04 mL/min (ref >60.00)
GLUCOSE: 174 mg/dL — AB (ref 70–99)
Potassium: 4.1 mEq/L (ref 3.5–5.1)
SODIUM: 138 meq/L (ref 135–145)
TOTAL PROTEIN: 6.9 g/dL (ref 6.0–8.3)

## 2017-08-29 LAB — URINALYSIS, ROUTINE W REFLEX MICROSCOPIC
Bilirubin Urine: NEGATIVE
Hgb urine dipstick: NEGATIVE
Ketones, ur: NEGATIVE
Leukocytes, UA: NEGATIVE
Nitrite: NEGATIVE
PH: 6 (ref 5.0–8.0)
RBC / HPF: NONE SEEN (ref 0–?)
SPECIFIC GRAVITY, URINE: 1.01 (ref 1.000–1.030)
Urine Glucose: NEGATIVE
Urobilinogen, UA: 0.2 (ref 0.0–1.0)

## 2017-08-29 LAB — LIPID PANEL
Cholesterol: 173 mg/dL (ref 0–200)
HDL: 33.7 mg/dL — ABNORMAL LOW (ref >39.00)
LDL Cholesterol: 112 mg/dL — ABNORMAL HIGH (ref 0–99)
NONHDL: 139.51
Total CHOL/HDL Ratio: 5
Triglycerides: 136 mg/dL (ref 0.0–149.0)
VLDL: 27.2 mg/dL (ref 0.0–40.0)

## 2017-08-29 LAB — CBC
HCT: 43.8 % (ref 39.0–52.0)
HEMOGLOBIN: 14.6 g/dL (ref 13.0–17.0)
MCHC: 33.4 g/dL (ref 30.0–36.0)
MCV: 79.5 fl (ref 78.0–100.0)
Platelets: 326 10*3/uL (ref 150.0–400.0)
RBC: 5.5 Mil/uL (ref 4.22–5.81)
RDW: 16.2 % — AB (ref 11.5–15.5)
WBC: 4.8 10*3/uL (ref 4.0–10.5)

## 2017-08-29 LAB — PSA: PSA: 0.35 ng/mL (ref 0.10–4.00)

## 2017-08-29 LAB — TSH: TSH: 1.54 u[IU]/mL (ref 0.35–4.50)

## 2017-08-30 ENCOUNTER — Other Ambulatory Visit: Payer: Self-pay | Admitting: Nurse Practitioner

## 2017-08-30 DIAGNOSIS — E1122 Type 2 diabetes mellitus with diabetic chronic kidney disease: Secondary | ICD-10-CM

## 2017-08-30 LAB — HIV ANTIBODY (ROUTINE TESTING W REFLEX): HIV: NONREACTIVE

## 2017-09-05 ENCOUNTER — Other Ambulatory Visit: Payer: Self-pay | Admitting: Nurse Practitioner

## 2017-09-05 DIAGNOSIS — R030 Elevated blood-pressure reading, without diagnosis of hypertension: Secondary | ICD-10-CM

## 2017-09-05 DIAGNOSIS — E118 Type 2 diabetes mellitus with unspecified complications: Secondary | ICD-10-CM

## 2017-09-26 ENCOUNTER — Encounter: Payer: Self-pay | Admitting: Nurse Practitioner

## 2017-09-26 ENCOUNTER — Ambulatory Visit (INDEPENDENT_AMBULATORY_CARE_PROVIDER_SITE_OTHER): Payer: 59 | Admitting: Nurse Practitioner

## 2017-09-26 VITALS — BP 120/74 | HR 84 | Temp 98.6°F | Resp 18 | Ht 72.0 in | Wt 318.0 lb

## 2017-09-26 DIAGNOSIS — E785 Hyperlipidemia, unspecified: Secondary | ICD-10-CM

## 2017-09-26 DIAGNOSIS — E1122 Type 2 diabetes mellitus with diabetic chronic kidney disease: Secondary | ICD-10-CM | POA: Diagnosis not present

## 2017-09-26 LAB — POCT GLYCOSYLATED HEMOGLOBIN (HGB A1C): Hemoglobin A1C: 7.5 % — AB (ref 4.0–5.6)

## 2017-09-26 MED ORDER — ROSUVASTATIN CALCIUM 10 MG PO TABS: 10.0000 mg | ORAL_TABLET | Freq: Every day | ORAL | 3 refills | Status: DC

## 2017-09-26 NOTE — Assessment & Plan Note (Signed)
Due to elevated cholesterol on 08/29/17 lipid panel along with hx of htn, dm, recommended he start statin today for ASCVD risk and he is agreeable Rx for rosuvastatin sent -dosing and side effects discussed Instructed to return for fasting labs in 6 weeks to check response to rosuvastatin Additional education provided on AVS - rosuvastatin (CRESTOR) 10 MG tablet; Take 1 tablet (10 mg total) by mouth daily.  Dispense: 30 tablet; Refill: 3 - Comprehensive metabolic panel; Future - Lipid panel; Future

## 2017-09-26 NOTE — Assessment & Plan Note (Signed)
Due to reported medication non-compliance, improving A1c, and new commitment to diet and exercise, will continue medications at current dosages today. We discussed the role of daily medication compliance in the control of diabetes, I believe if he was consistent with daily medications we could get him to A1c goal of <7. Encouraged to keep up the good work with healthy diet and exercise.  RTC in 3 months for F/U- repeat A1c - POCT HgB A1C-7.5

## 2017-09-26 NOTE — Patient Instructions (Addendum)
Please start crestor 10mg  once daily for your cholesterol. Please return to the lab in about 6 weeks, around the end of September, to repeat fasting lab work.  Please return to see me in about 3 months for routine follow up.  Great job on [employer] diet and exercise! Keep up the good work!!!   Cholesterol Cholesterol is a fat. Your body needs a small amount of cholesterol. Cholesterol (plaque) may build up in your blood vessels (arteries). That makes you more likely to have a heart attack or stroke. You cannot feel your cholesterol level. Having a blood test is the only way to find out if your level is high. Keep your test results. Work with your doctor to keep your cholesterol at a good level. What do the results mean?  Total cholesterol is how much cholesterol is in your blood.  LDL is bad cholesterol. This is the type that can build up. Try to have low LDL.  HDL is good cholesterol. It cleans your blood vessels and carries LDL away. Try to have high HDL.  Triglycerides are fat that the body can store or burn for energy. What are good levels of cholesterol?  Total cholesterol below 200.  LDL below 100 is good for people who have health risks. LDL below 70 is good for people who have very high risks.  HDL above 40 is good. It is best to have HDL of 60 or higher.  Triglycerides below 150. How can I lower my cholesterol? Diet Follow your diet program as told by your doctor.  Choose fish, white meat chicken, or [place] that is roasted or baked. Try not to eat red meat, fried foods, sausage, or lunch meats.  Eat lots of fresh fruits and vegetables.  Choose whole grains, beans, pasta, potatoes, and cereals.  Choose olive oil, corn oil, or canola oil. Only use small amounts.  Try not to eat butter, mayonnaise, shortening, or palm kernel oils.  Try not to eat foods with trans fats.  Choose low-fat or nonfat dairy foods. ? Drink skim or nonfat milk. ? Eat low-fat or nonfat yogurt  and cheeses. ? Try not to drink whole milk or cream. ? Try not to eat ice cream, egg yolks, or full-fat cheeses.  Healthy desserts include angel food cake, ginger snaps, animal crackers, hard candy, popsicles, and low-fat or nonfat frozen yogurt. Try not to eat pastries, cakes, pies, and cookies.  Exercise Follow your exercise program as told by your doctor.  Be more active. Try gardening, walking, and taking the stairs.  Ask your doctor about ways that you can be more active.  Medicine  Take over-the-counter and prescription medicines only as told by your doctor. This information is not intended to replace advice given to you by your health care provider. Make sure you discuss any questions you have with your health care provider. Document Released: 04/28/2008 Document Revised: 09/01/2015 Document Reviewed: 08/12/2015 Elsevier Interactive Patient Education  [employer]2018 Elsevier Inc.

## 2017-09-26 NOTE — Progress Notes (Signed)
Name: Jordan Cline   MRN: [id number]    DOB: 07/06/1975   Date:09/26/2017       Progress Note  Subjective  Chief Complaint  Chief Complaint  Patient presents with  . Follow-up    A1c    HPI Mr Jordan Cline is here today for follow up of diabetes, we will discuss high cholesterol as well. He is feeling well today, with no complaints  Diabetes- maintained on  Lantus 18 units daily,  metformin 500 BID. Due to elevated glucose reading in July it was recommended to increase his insulin to 20-22 units daily but he tells me today he did not increase the dosage, continued 18 and admits he has tried to be more compliant with medications but does occasionally miss a dose of his lantus or metformin. He does tell me that he felt his A1c was going to be better today because he has committed to improving his diet and exercising consistently.  He denies tremor, diaphoresis, polydipsia, polyphagia. Urinary frequency, reported at CPE on 08/23/17, has resolved as well.  Lab Results  Component Value Date   HGBA1C 9.4 (A) 07/13/2017   Cholesterol- currently not maintained on medications with noted elevated cholesterol on recent lipid panel and a history of hypertension, DM. Recently has started to improve diet and exercise and try to lose weight  Lab Results  Component Value Date   CHOL 173 08/29/2017   HDL 33.70 (L) 08/29/2017   LDLCALC 112 (H) 08/29/2017   TRIG 136.0 08/29/2017   CHOLHDL 5 08/29/2017   The 10-year ASCVD risk score Jordan Cline DC Jr., et al., 2013) is: 9.7%   Values used to calculate the score:     Age: 42 years     Sex: Male     Is Non-Hispanic African American: Yes     Diabetic: Yes     Tobacco smoker: No     Systolic Blood Pressure: 433 mmHg     Is BP treated: Yes     HDL Cholesterol: 33.7 mg/dL     Total Cholesterol: 173 mg/dL  Wt Readings from Last 3 Encounters:  09/26/17 (!) 318 lb (144.2 kg)  08/23/17 (!) 323 lb (146.5 kg)  07/13/17 (!) 328 lb (148.8 kg)    Patient  Active Problem List   Diagnosis Date Noted  . Routine general medical examination at a health care facility 08/23/2017  . Erectile dysfunction associated with type 2 diabetes mellitus ([name]) 09/22/2014  . Type II diabetes mellitus ([name]) 05/13/2014  . OSA (obstructive sleep apnea) 05/13/2014  . Excessive daytime sleepiness 04/20/2014  . PFO (patent foramen ovale) 03/19/2014  . Borderline hypertension   . 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  Family History  Problem Relation Age of Onset  . Arrhythmia Mother   . Breast cancer Mother   . Hyperlipidemia Mother   . Diabetes Father   . Hypertension Father   . Cancer Maternal Uncle   . Cancer Maternal Grandfather   . Breast cancer Maternal Grandmother   . Stroke Paternal Grandmother     Social History   Socioeconomic History  . Marital status: Significant Other    Spouse name: Not on file  . Number of children: 0  . Years of education: 81  . Highest education level: Not on file  Occupational History  . Occupation: [employer]  Social Needs  . Financial resource strain: Not on file  . Food  insecurity:    Worry: Not on file    Inability: Not on file  . Transportation needs:    Medical: Not on file    Non-medical: Not on file  Tobacco Use  . Smoking status: Former Smoker    Packs/day: 0.00    Years: 12.00    Pack years: 0.00    Types: Cigarettes    Last attempt to quit: 02/13/2014    Years since quitting: 3.6  . Smokeless tobacco: Never Used  Substance and Sexual Activity  . Alcohol use: Yes    Alcohol/week: 0.0 standard drinks    Comment: occasionallly  . Drug use: No  . Sexual activity: Not on file  Lifestyle  . Physical activity:    Days per week: Not on file    Minutes per session: Not on file  . Stress: Not on file  Relationships  . Social connections:    Talks on phone: Not on file    Gets together: Not on file    Attends religious  service: Not on file    Active member of club or organization: Not on file    Attends meetings of clubs or organizations: Not on file    Relationship status: Not on file  . Intimate partner violence:    Fear of current or ex partner: Not on file    Emotionally abused: Not on file    Physically abused: Not on file    Forced sexual activity: Not on file  Other Topics Concern  . Not on file  Social History Narrative   Getting married on June 11th to his fiance. Fun: Outdoors, traveling, movies, some sports.    Denies any religious beliefs effecting health care.      Current Outpatient Medications:  .  blood glucose meter kit and supplies KIT, Dispense based on patient and insurance preference. Use up to four times daily as directed. (FOR ICD-9 250.00, 250.01)., Disp: 1 each, Rfl: 0 .  folic acid (FOLVITE) 1 MG tablet, TAKE 1 TABLET BY MOUTH EVERY DAY, Disp: 90 tablet, Rfl: 2 .  glucose blood (ACCU-CHEK AVIVA PLUS) test strip, USE AS DIRECTED 4 TIMES A DAY, Disp: 100 each, Rfl: 2 .  Insulin Glargine (LANTUS SOLOSTAR) 100 UNIT/ML Solostar Pen, Inject 18 Units into the skin daily at 10 pm., Disp: 3 mL, Rfl: 9 .  lisinopril (PRINIVIL,ZESTRIL) 10 MG tablet, TAKE 1 TABLET BY MOUTH EVERY DAY, Disp: 90 tablet, Rfl: 1 .  metFORMIN (GLUCOPHAGE) 500 MG tablet, TAKE 1 TABLET (500 MG TOTAL) BY MOUTH 2 (TWO) TIMES DAILY WITH A MEAL., Disp: 180 tablet, Rfl: 3 .  XARELTO 10 MG TABS tablet, TAKE 1 TABLET BY MOUTH EVERY DAY, Disp: 30 tablet, Rfl: 5  No Known Allergies   ROS See HPI  Objective  Vitals:   09/26/17 0802  BP: 120/74  Pulse: 84  Resp: 18  Temp: 98.6 F (37 C)  TempSrc: Oral  SpO2: 98%  Weight: (!) 318 lb (144.2 kg)  Height: 6' (1.829 m)    Body mass index is 43.13 kg/m.  Physical Exam Vital signs reviewed. Constitutional: Patient appears well-developed and well-nourished. No distress.  HENT: Head: Normocephalic and atraumatic. Nose: Nose normal. Mouth/Throat: Oropharynx is  clear and moist. No oropharyngeal exudate.  Eyes: Conjunctivae and EOM are normal. Pupils are equal, round, and reactive to light. No scleral icterus.  Neck: Normal range of motion. Neck supple.  Cardiovascular: Normal rate, regular rhythm and normal heart sounds.  No murmur heard. Distal  pulses intact. Pulmonary/Chest: Effort normal and breath sounds normal. No respiratory distress. Neurological: He is alert and oriented to person, place, and time. No cranial nerve deficit. Coordination, balance, strength, speech and gait are normal.  Skin: Skin is warm and dry. Psychiatric: Patient has a normal mood and affect. behavior is normal. Judgment and thought content normal.   Assessment & Plan RTC in 3 months for F/U: DM- recheck A1c

## 2017-09-27 ENCOUNTER — Other Ambulatory Visit: Payer: Self-pay | Admitting: Pulmonary Disease

## 2017-10-01 ENCOUNTER — Encounter: Payer: Self-pay | Admitting: Endocrinology

## 2017-11-09 DIAGNOSIS — G4733 Obstructive sleep apnea (adult) (pediatric): Secondary | ICD-10-CM | POA: Diagnosis not present

## 2017-11-21 ENCOUNTER — Ambulatory Visit: Payer: 59 | Admitting: Adult Health

## 2017-11-22 ENCOUNTER — Other Ambulatory Visit: Payer: Self-pay | Admitting: Nurse Practitioner

## 2017-11-22 DIAGNOSIS — E785 Hyperlipidemia, unspecified: Secondary | ICD-10-CM

## 2017-12-05 ENCOUNTER — Ambulatory Visit: Payer: 59 | Admitting: Adult Health

## 2017-12-06 ENCOUNTER — Ambulatory Visit (INDEPENDENT_AMBULATORY_CARE_PROVIDER_SITE_OTHER): Payer: 59 | Admitting: Adult Health

## 2017-12-06 ENCOUNTER — Encounter: Payer: Self-pay | Admitting: Adult Health

## 2017-12-06 DIAGNOSIS — G4733 Obstructive sleep apnea (adult) (pediatric): Secondary | ICD-10-CM | POA: Diagnosis not present

## 2017-12-06 DIAGNOSIS — I2699 Other pulmonary embolism without acute cor pulmonale: Secondary | ICD-10-CM | POA: Diagnosis not present

## 2017-12-06 NOTE — Assessment & Plan Note (Signed)
Very severe sleep apnea.  Patient education on importance of CPAP compliance.  Patient education on potential complications of untreated sleep apnea.  Patient is encouraged on compliance and usage. CPap download was requested.  Plan  Patient Instructions  Please Wear your CPAP each and every night , try to get in at least 6 hours .  Work on healthy weight .  Do not drive if sleepy  Continue on Xarelto daily .  Report any signs of bleeding . Avoid NSAIDS  Follow up in 6 months with Vassie Loll or Doshie Maggi NP and As needed   Please contact office for sooner follow up if symptoms do not improve or worsen or seek emergency care

## 2017-12-06 NOTE — Progress Notes (Signed)
_0  ID: Jordan Cline, male    DOB: [birth year], 42 y.o.   MRN: [id number]  Chief Complaint  Patient presents with  . Follow-up    OSA    Referring provider: Golden Circle, FNP  HPI: 42 year old male former smoker followed for severe sleep apnea and submassive PE (unprovoked -Risk factor -obesity /Hypercoaguable panel neg )  in 2016  TEST /Events  Critical illness February 2016 with submassive PE with right heart strain and right DVT requiring catheter directed thrombolysis, discharged on Xarelto.   Echo 03/2014 showed .Mild LVH, grade 1 diastolic dysfunction, RV moderately dilated with decreased systolic function. ? Bubble study difficult , possibly positive suggesting PFO.   CT chest angio 03/2014 >Saddle embolus would large emboli extending into both lower lobes. Emboli also noted in both upper lobes. Possible early lingular pulmonary infarct.     hypercoag panel neg  HST 3/ 2016>AHI of 85  Venous Doppler December 2016- negative DVT  12/06/2017 Follow up : OSA , PE  Patient returns for a one-year follow-up.  Patient has underlying very severe sleep apnea.  Previous sleep study in 2016 showed AHI 85.  Patient is on CPAP at bedtime.  Does admit that he is not compliant each night.  Tries to wear his CPAP at least 3-4 times a week.  Gets in about 4 to 5 hours.  Discussed the importance of improving his compliance and increased his daily hour usage. Patient education on sleep apnea along with potential complications of untreated sleep apnea.  Patient does have multiple risk factors including obesity hypertension, diabetes  CPAP download was requested.  Patient did have a unprovoked PE in 2016 has been on Xarelto since discharge.  Last year Xarelto was decreased to 10 mg daily.  As patient has risk factors of obesity. Patient says he is working hard on weight loss.  Has down 20 to 25 pounds.  He is trying to exercise each day. He denies any known bleeding.  Denies  any chest pain or shortness of breath.   No Known Allergies  Immunization History  Administered Date(s) Administered  . Tdap 08/23/2017    Past Medical History:  Diagnosis Date  . Acute deep vein thrombosis (DVT) of popliteal vein of right lower extremity ([place]) 03/2014  . Allergy   . Borderline hypertension   . Diabetes mellitus type 2 in obese ([place]) 03/2014   HgbA1c 13.0  . 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 Sleep apnea     Tobacco History: Social History   Tobacco Use  Smoking Status Former Smoker  . Packs/day: 0.00  . Years: 12.00  . Pack years: 0.00  . Types: Cigarettes  . Last attempt to quit: 02/13/2014  . Years since quitting: 3.8  Smokeless Tobacco Never Used   Counseling given: Not Answered   Outpatient Medications Prior to Visit  Medication Sig Dispense Refill  . blood glucose meter kit and supplies KIT Dispense based on patient and insurance preference. Use up to four times daily as directed. (FOR ICD-9 250.00, 250.01). 1 each 0  . folic acid (FOLVITE) 1 MG tablet TAKE 1 TABLET BY MOUTH EVERY DAY 90 tablet 2  . glucose blood (ACCU-CHEK AVIVA PLUS) test strip USE AS DIRECTED 4 TIMES A DAY 100 each 2  . Insulin Glargine (LANTUS SOLOSTAR) 100 UNIT/ML Solostar Pen Inject 18 Units into the skin daily at 10 pm. 3 mL 9  . lisinopril (PRINIVIL,ZESTRIL)  10 MG tablet TAKE 1 TABLET BY MOUTH EVERY DAY 90 tablet 1  . metFORMIN (GLUCOPHAGE) 500 MG tablet TAKE 1 TABLET (500 MG TOTAL) BY MOUTH 2 (TWO) TIMES DAILY WITH A MEAL. 180 tablet 3  . rosuvastatin (CRESTOR) 10 MG tablet TAKE 1 TABLET BY MOUTH EVERY DAY 90 tablet 1  . XARELTO 10 MG TABS tablet TAKE 1 TABLET BY MOUTH EVERY DAY 30 tablet 5   No facility-administered medications prior to visit.      Review of Systems  Constitutional:   No  weight loss, night sweats,  Fevers, chills, fatigue, or  lassitude.  HEENT:   No headaches,  Difficulty swallowing,   Tooth/dental problems, or  Sore throat,                No sneezing, itching, ear ache, nasal congestion, post nasal drip,   CV:  No chest pain,  Orthopnea, PND, swelling in lower extremities, anasarca, dizziness, palpitations, syncope.   GI  No heartburn, indigestion, abdominal pain, nausea, vomiting, diarrhea, change in bowel habits, loss of appetite, bloody stools.   Resp: No shortness of breath with exertion or at rest.  No excess mucus, no productive cough,  No non-productive cough,  No coughing up of blood.  No change in color of mucus.  No wheezing.  No chest wall deformity  Skin: no rash or lesions.  GU: no dysuria, change in color of urine, no urgency or frequency.  No flank pain, no hematuria   MS:  No joint pain or swelling.  No decreased range of motion.  No back pain.    Physical Exam  BP 128/80 (BP Location: Left Arm, Cuff Size: Large)   Pulse (!) 107   Ht 6' (1.829 m)   Wt (!) 317 lb (143.8 kg)   SpO2 99%   BMI 42.99 kg/m   GEN: A/Ox3; pleasant , NAD,  Obese    HEENT:  [place]/AT,  EACs-clear, TMs-wnl, NOSE-clear, THROAT-clear, no lesions, no postnasal drip or exudate noted. Class 3  MP airway   NECK:  Supple w/ fair ROM; no JVD; normal carotid impulses w/o bruits; no thyromegaly or nodules palpated; no lymphadenopathy.    RESP  Clear  P & A; w/o, wheezes/ rales/ or rhonchi. no accessory muscle use, no dullness to percussion  CARD:  RRR, no m/r/g, no peripheral edema, pulses intact, no cyanosis or clubbing.  GI:   Soft & nt; nml bowel sounds; no organomegaly or masses detected.   Musco: Warm bil, no deformities or joint swelling noted.   Neuro: alert, no focal deficits noted.    Skin: Warm, no lesions or rashes    Lab Results:  CBC   BMET  BNP  ProBNP No results found for: PROBNP  Imaging: No results found.   Assessment & Plan:   OSA (obstructive sleep apnea) Very severe sleep apnea.  Patient education on importance of CPAP compliance.   Patient education on potential complications of untreated sleep apnea.  Patient is encouraged on compliance and usage. CPap download was requested.  Plan  Patient Instructions  Please Wear your CPAP each and every night , try to get in at least 6 hours .  Work on healthy weight .  Do not drive if sleepy  Continue on Xarelto daily .  Report any signs of bleeding . Avoid NSAIDS  Follow up in 6 months with Elsworth Soho or Ryaan Vanwagoner NP and As needed   Please contact office for sooner follow up if symptoms do  not improve or worsen or seek emergency care         Morbid obesity ([place]) Wt loss   PE (pulmonary thromboembolism) ([place]) Provoked PE 2016 major risk factor was obesity.  Hypercoagulable panel was negative.  Patient did have a severe submassive PE requiring catheter directed thrombolysis. For now patient will continue on Xarelto at 10 mg daily. Patient is working aggressively on weight loss.   Would be reasonable if patient achieved goal weight to consider discontinuation of Xarelto going forward patient has been on greater than 2 years of therapy.  Previous evaluation with hematology had recommended 2 years of Xarelto treatment. Discuss on return.      Rexene Edison, NP 12/06/2017

## 2017-12-06 NOTE — Addendum Note (Signed)
Addended by: Boone Master E on: 12/06/2017 12:08 PM   Modules accepted: Orders

## 2017-12-06 NOTE — Patient Instructions (Signed)
Please Wear your CPAP each and every night , try to get in at least 6 hours .  Work on healthy weight .  Do not drive if sleepy  Continue on Xarelto daily .  Report any signs of bleeding . Avoid NSAIDS  Follow up in 6 months with Jordan Cline or Jordan Mclaurin NP and As needed   Please contact office for sooner follow up if symptoms do not improve or worsen or seek emergency care

## 2017-12-06 NOTE — Assessment & Plan Note (Signed)
Wt loss  

## 2017-12-06 NOTE — Assessment & Plan Note (Signed)
Provoked PE 2016 major risk factor was obesity.  Hypercoagulable panel was negative.  Patient did have a severe submassive PE requiring catheter directed thrombolysis. For now patient will continue on Xarelto at 10 mg daily. Patient is working aggressively on weight loss.   Would be reasonable if patient achieved goal weight to consider discontinuation of Xarelto going forward patient has been on greater than 2 years of therapy.  Previous evaluation with hematology had recommended 2 years of Xarelto treatment. Discuss on return.

## 2018-01-07 ENCOUNTER — Other Ambulatory Visit: Payer: Self-pay | Admitting: *Deleted

## 2018-01-07 DIAGNOSIS — E1122 Type 2 diabetes mellitus with diabetic chronic kidney disease: Secondary | ICD-10-CM

## 2018-01-07 DIAGNOSIS — E785 Hyperlipidemia, unspecified: Secondary | ICD-10-CM

## 2018-01-07 MED ORDER — ROSUVASTATIN CALCIUM 10 MG PO TABS: 10.0000 mg | ORAL_TABLET | Freq: Every day | ORAL | 2 refills | Status: DC

## 2018-01-07 MED ORDER — METFORMIN HCL 500 MG PO TABS: 500.0000 mg | ORAL_TABLET | Freq: Two times a day (BID) | ORAL | 2 refills | Status: DC

## 2018-02-05 ENCOUNTER — Other Ambulatory Visit: Payer: Self-pay | Admitting: Family

## 2018-02-05 DIAGNOSIS — E1122 Type 2 diabetes mellitus with diabetic chronic kidney disease: Secondary | ICD-10-CM

## 2018-02-05 MED ORDER — INSULIN GLARGINE 100 UNIT/ML SOLOSTAR PEN: 18.0000 [IU] | PEN_INJECTOR | Freq: Every day | SUBCUTANEOUS | 0 refills | Status: DC

## 2018-03-17 DIAGNOSIS — K047 Periapical abscess without sinus: Secondary | ICD-10-CM | POA: Diagnosis not present

## 2018-05-10 DIAGNOSIS — G4733 Obstructive sleep apnea (adult) (pediatric): Secondary | ICD-10-CM | POA: Diagnosis not present

## 2018-07-09 ENCOUNTER — Other Ambulatory Visit: Payer: Self-pay | Admitting: *Deleted

## 2018-07-09 DIAGNOSIS — R030 Elevated blood-pressure reading, without diagnosis of hypertension: Secondary | ICD-10-CM

## 2018-07-09 DIAGNOSIS — E118 Type 2 diabetes mellitus with unspecified complications: Secondary | ICD-10-CM

## 2018-07-09 MED ORDER — LISINOPRIL 10 MG PO TABS: 10.0000 mg | ORAL_TABLET | Freq: Every day | ORAL | 0 refills | Status: DC

## 2018-07-09 NOTE — Telephone Encounter (Signed)
Left detailed message for patient to call office and schedule visit with any available provider if he is going to continue his care here. LOV with [facility] was 09/26/17 and he was due back in 12/2017.

## 2018-07-17 ENCOUNTER — Other Ambulatory Visit: Payer: Self-pay | Admitting: Pulmonary Disease

## 2018-08-07 ENCOUNTER — Ambulatory Visit: Payer: 59 | Admitting: Family

## 2018-08-20 ENCOUNTER — Other Ambulatory Visit: Payer: Self-pay | Admitting: Family

## 2018-08-20 ENCOUNTER — Other Ambulatory Visit (INDEPENDENT_AMBULATORY_CARE_PROVIDER_SITE_OTHER): Payer: 59

## 2018-08-20 ENCOUNTER — Encounter: Payer: Self-pay | Admitting: Family

## 2018-08-20 ENCOUNTER — Ambulatory Visit (INDEPENDENT_AMBULATORY_CARE_PROVIDER_SITE_OTHER): Payer: 59 | Admitting: Family

## 2018-08-20 ENCOUNTER — Other Ambulatory Visit: Payer: Self-pay

## 2018-08-20 ENCOUNTER — Other Ambulatory Visit: Payer: Self-pay | Admitting: Internal Medicine

## 2018-08-20 VITALS — BP 120/82 | HR 92 | Temp 97.9°F | Ht 72.0 in | Wt 309.0 lb

## 2018-08-20 DIAGNOSIS — R030 Elevated blood-pressure reading, without diagnosis of hypertension: Secondary | ICD-10-CM

## 2018-08-20 DIAGNOSIS — R4689 Other symptoms and signs involving appearance and behavior: Secondary | ICD-10-CM | POA: Diagnosis not present

## 2018-08-20 DIAGNOSIS — E118 Type 2 diabetes mellitus with unspecified complications: Secondary | ICD-10-CM

## 2018-08-20 DIAGNOSIS — E1122 Type 2 diabetes mellitus with diabetic chronic kidney disease: Secondary | ICD-10-CM | POA: Diagnosis not present

## 2018-08-20 DIAGNOSIS — E785 Hyperlipidemia, unspecified: Secondary | ICD-10-CM

## 2018-08-20 LAB — POCT GLYCOSYLATED HEMOGLOBIN (HGB A1C): Hemoglobin A1C: 13.8 % — AB (ref 4.0–5.6)

## 2018-08-20 LAB — COMPREHENSIVE METABOLIC PANEL
ALT: 35 U/L (ref 0–53)
AST: 23 U/L (ref 0–37)
Albumin: 4.2 g/dL (ref 3.5–5.2)
Alkaline Phosphatase: 80 U/L (ref 39–117)
BUN: 9 mg/dL (ref 6–23)
CO2: 24 mEq/L (ref 19–32)
Calcium: 9 mg/dL (ref 8.4–10.5)
Chloride: 99 mEq/L (ref 96–112)
Creatinine, Ser: 0.87 mg/dL (ref 0.40–1.50)
GFR: 116.38 mL/min (ref >60.00)
Glucose, Bld: 325 mg/dL — ABNORMAL HIGH (ref 70–99)
Potassium: 3.9 mEq/L (ref 3.5–5.1)
Sodium: 133 mEq/L — ABNORMAL LOW (ref 135–145)
Total Bilirubin: 0.4 mg/dL (ref 0.2–1.2)
Total Protein: 7.2 g/dL (ref 6.0–8.3)

## 2018-08-20 LAB — LIPID PANEL
Cholesterol: 144 mg/dL (ref 0–200)
HDL: 34 mg/dL — ABNORMAL LOW (ref >39.00)
NonHDL: 109.55
Total CHOL/HDL Ratio: 4
Triglycerides: 217 mg/dL — ABNORMAL HIGH (ref 0.0–149.0)
VLDL: 43.4 mg/dL — ABNORMAL HIGH (ref 0.0–40.0)

## 2018-08-20 LAB — CBC WITH DIFFERENTIAL/PLATELET
Basophils Absolute: 0.1 10*3/uL (ref 0.0–0.1)
Basophils Relative: 0.9 % (ref 0.0–3.0)
Eosinophils Absolute: 0.1 10*3/uL (ref 0.0–0.7)
Eosinophils Relative: 1.6 % (ref 0.0–5.0)
HCT: 45.2 % (ref 39.0–52.0)
Hemoglobin: 14.9 g/dL (ref 13.0–17.0)
Lymphocytes Relative: 29.1 % (ref 12.0–46.0)
Lymphs Abs: 1.7 10*3/uL (ref 0.7–4.0)
MCHC: 33 g/dL (ref 30.0–36.0)
MCV: 80.4 fl (ref 78.0–100.0)
Monocytes Absolute: 0.4 10*3/uL (ref 0.1–1.0)
Monocytes Relative: 6.7 % (ref 3.0–12.0)
Neutro Abs: 3.7 10*3/uL (ref 1.4–7.7)
Neutrophils Relative %: 61.7 % (ref 43.0–77.0)
Platelets: 285 10*3/uL (ref 150.0–400.0)
RBC: 5.62 Mil/uL (ref 4.22–5.81)
RDW: 16 % — ABNORMAL HIGH (ref 11.5–15.5)
WBC: 5.9 10*3/uL (ref 4.0–10.5)

## 2018-08-20 LAB — MICROALBUMIN / CREATININE URINE RATIO
Creatinine,U: 103.5 mg/dL
Microalb Creat Ratio: 12.2 mg/g (ref 0.0–30.0)
Microalb, Ur: 12.6 mg/dL — ABNORMAL HIGH (ref 0.0–1.9)

## 2018-08-20 LAB — LDL CHOLESTEROL, DIRECT: Direct LDL: 91 mg/dL

## 2018-08-20 MED ORDER — LANTUS SOLOSTAR 100 UNIT/ML ~~LOC~~ SOPN: 18.0000 [IU] | PEN_INJECTOR | Freq: Every day | SUBCUTANEOUS | 0 refills | Status: DC

## 2018-08-20 MED ORDER — ROSUVASTATIN CALCIUM 10 MG PO TABS: 10.0000 mg | ORAL_TABLET | Freq: Every day | ORAL | 3 refills | Status: DC

## 2018-08-20 MED ORDER — LISINOPRIL 10 MG PO TABS: 10.0000 mg | ORAL_TABLET | Freq: Every day | ORAL | 3 refills | Status: DC

## 2018-08-20 NOTE — Telephone Encounter (Signed)
OV with you today. Lantus Solostar is not covered.  Please advise.

## 2018-08-20 NOTE — Progress Notes (Signed)
Jordan Cline is a 43 y.o. male with the following history as recorded in EpicCare:  Patient Active Problem List   Diagnosis Date Noted  . Morbid obesity ([place]) 12/06/2017  . Hyperlipidemia 09/26/2017  . Routine general medical examination at a health care facility 08/23/2017  . Erectile dysfunction associated with type 2 diabetes mellitus ([place]) 09/22/2014  . Type II diabetes mellitus ([place]) 05/13/2014  . OSA (obstructive sleep apnea) 05/13/2014  . Excessive daytime sleepiness 04/20/2014  . PFO (patent foramen ovale) 03/19/2014  . Borderline hypertension   . PE (pulmonary thromboembolism) ([place])     Current Outpatient Medications  Medication Sig Dispense Refill  . blood glucose meter kit and supplies KIT Dispense based on patient and insurance preference. Use up to four times daily as directed. (FOR ICD-9 250.00, 250.01). 1 each 0  . folic acid (FOLVITE) 1 MG tablet TAKE 1 TABLET BY MOUTH EVERY DAY 90 tablet 2  . glucose blood (ACCU-CHEK AVIVA PLUS) test strip USE AS DIRECTED 4 TIMES A DAY 100 each 2  . Insulin Glargine (LANTUS SOLOSTAR) 100 UNIT/ML Solostar Pen Inject 18 Units into the skin at bedtime. 3 mL 0  . lisinopril (ZESTRIL) 10 MG tablet Take 1 tablet (10 mg total) by mouth daily. NEEDS TO ESTABLISH WITH NEW PROVIDER. 90 tablet 0  . metFORMIN (GLUCOPHAGE) 500 MG tablet Take 1 tablet (500 mg total) by mouth 2 (two) times daily with a meal. 180 tablet 2  . rosuvastatin (CRESTOR) 10 MG tablet Take 1 tablet (10 mg total) by mouth daily. 90 tablet 2  . XARELTO 10 MG TABS tablet TAKE 1 TABLET BY MOUTH EVERY DAY 30 tablet 1   No current facility-administered medications for this visit.     Allergies: Patient has no known allergies.  Past Medical History:  Diagnosis Date  . Acute deep vein thrombosis (DVT) of popliteal vein of right lower extremity ([place]) 03/2014  . Allergy   . Borderline hypertension   . Diabetes mellitus type 2 in obese ([place]) 03/2014   HgbA1c 13.0  . 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 Sleep apnea     History reviewed. No pertinent surgical history.  Family History  Problem Relation Age of Onset  . Arrhythmia Mother   . Breast cancer Mother   . Hyperlipidemia Mother   . Diabetes Father   . Hypertension Father   . Cancer Maternal Uncle   . Cancer Maternal Grandfather   . Breast cancer Maternal Grandmother   . Stroke Paternal Grandmother     Social History   Tobacco Use  . Smoking status: Former Smoker    Packs/day: 0.00    Years: 12.00    Pack years: 0.00    Types: Cigarettes    Quit date: 02/13/2014    Years since quitting: 4.5  . Smokeless tobacco: Never Used  Substance Use Topics  . Alcohol use: Yes    Alcohol/week: 0.0 standard drinks    Comment: occasionallly    Subjective:  Follow up on Type 2 Diabetes; last OV was in August 2019- has not responded to repeated requests for OV; history of non-compliant behavior;  has also been prescribed Lisinopril and Crestor as part of diabetes management- does state that he is taking these medications;  History of PE- taking Xarelto daily; under care of pulmonology for management of Xarelto- due for OV in October 2020 ; has OSA as well- does not appear to use  his machine regularly;   Hgba1c is at 13.8 today- has been off his Lantus for at least the past 6 months; has only been taking his Metformin once per day for at least the past year; does not check his blood sugar regularly; denies any concerns for low blood sugar;     Objective:  Vitals:   08/20/18 1042  BP: 120/82  Pulse: 92  Temp: 97.9 F (36.6 C)  TempSrc: Oral  SpO2: 97%  Weight: (!) 309 lb (140.2 kg)  Height: 6' (1.829 m)    General: Well developed, well nourished, in no acute distress  Skin : Warm and dry.  Head: Normocephalic and atraumatic  Eyes: Sclera and conjunctiva clear; pupils round and reactive to light; extraocular movements intact  Ears: External  normal; canals clear; tympanic membranes normal  Oropharynx: Pink, supple. No suspicious lesions  Neck: Supple without thyromegaly, adenopathy  Lungs: Respirations unlabored; clear to auscultation bilaterally without wheeze, rales, rhonchi  CVS exam: normal rate and regular rhythm.  Extremities: No edema, cyanosis, clubbing; foot exam is wnl today Vessels: Symmetric bilaterally  Neurologic: Alert and oriented; speech intact; face symmetrical; moves all extremities well; CNII-XII intact without focal deficit   Assessment:  1. Type 2 diabetes mellitus with chronic kidney disease, without long-term current use of insulin, unspecified CKD stage ([place])   2. Non-compliant behavior     Plan:  Patient's hgba1c today is 13.8; stressed need to take medication as prescribed and keep regular follow-ups; he will re-start his Metformin bid and re-start Lantus- he understands to start at 10 units and increase by 2 units every 2 days until he gets back to 18 units daily; check CBC, CMP, lipid panel today; continue Crestor and Lisinopril- refills updated; follow-up in 3 months- could consider adding Trulicity at that time;  Encouraged to continue working on weight loss goals;     Return in about 3 months (around 11/20/2018).  Orders Placed This Encounter  Procedures  . CBC w/Diff    Standing Status:   Future    Number of Occurrences:   1    Standing Expiration Date:   08/20/2019  . Comp Met (CMET)    Standing Status:   Future    Number of Occurrences:   1    Standing Expiration Date:   08/20/2019  . Lipid panel    Standing Status:   Future    Number of Occurrences:   1    Standing Expiration Date:   08/20/2019  . Urine Microalbumin w/creat. ratio    Standing Status:   Future    Number of Occurrences:   1    Standing Expiration Date:   08/20/2019  . POCT glycosylated hemoglobin (Hb A1C)    Requested Prescriptions   Signed Prescriptions Disp Refills  . Insulin Glargine (LANTUS SOLOSTAR) 100 UNIT/ML  Solostar Pen 3 mL 0    Sig: Inject 18 Units into the skin at bedtime.

## 2018-09-20 ENCOUNTER — Other Ambulatory Visit: Payer: Self-pay | Admitting: Internal Medicine

## 2018-09-20 DIAGNOSIS — E1122 Type 2 diabetes mellitus with diabetic chronic kidney disease: Secondary | ICD-10-CM

## 2018-09-25 ENCOUNTER — Other Ambulatory Visit: Payer: Self-pay | Admitting: Pulmonary Disease

## 2018-09-25 NOTE — Telephone Encounter (Signed)
Dr. Elsworth Soho,  Please advise if it is okay to refill med for pt. Thanks!

## 2018-11-10 DIAGNOSIS — G4733 Obstructive sleep apnea (adult) (pediatric): Secondary | ICD-10-CM | POA: Diagnosis not present

## 2018-11-15 ENCOUNTER — Other Ambulatory Visit: Payer: Self-pay | Admitting: Family

## 2018-11-15 DIAGNOSIS — E785 Hyperlipidemia, unspecified: Secondary | ICD-10-CM

## 2018-11-15 DIAGNOSIS — E1122 Type 2 diabetes mellitus with diabetic chronic kidney disease: Secondary | ICD-10-CM

## 2018-11-15 MED ORDER — ROSUVASTATIN CALCIUM 10 MG PO TABS: 10.0000 mg | ORAL_TABLET | Freq: Every day | ORAL | 3 refills | Status: DC

## 2018-11-15 MED ORDER — METFORMIN HCL 500 MG PO TABS: 500.0000 mg | ORAL_TABLET | Freq: Two times a day (BID) | ORAL | 0 refills | Status: DC

## 2018-11-20 ENCOUNTER — Ambulatory Visit (INDEPENDENT_AMBULATORY_CARE_PROVIDER_SITE_OTHER): Payer: 59 | Admitting: Family

## 2018-11-20 ENCOUNTER — Other Ambulatory Visit (INDEPENDENT_AMBULATORY_CARE_PROVIDER_SITE_OTHER): Payer: 59

## 2018-11-20 ENCOUNTER — Encounter: Payer: Self-pay | Admitting: Family

## 2018-11-20 ENCOUNTER — Other Ambulatory Visit: Payer: Self-pay

## 2018-11-20 VITALS — BP 116/80 | HR 94 | Temp 98.5°F | Ht 72.0 in | Wt 320.2 lb

## 2018-11-20 DIAGNOSIS — I2602 Saddle embolus of pulmonary artery with acute cor pulmonale: Secondary | ICD-10-CM | POA: Diagnosis not present

## 2018-11-20 DIAGNOSIS — E1122 Type 2 diabetes mellitus with diabetic chronic kidney disease: Secondary | ICD-10-CM | POA: Diagnosis not present

## 2018-11-20 DIAGNOSIS — I2699 Other pulmonary embolism without acute cor pulmonale: Secondary | ICD-10-CM | POA: Diagnosis not present

## 2018-11-20 LAB — COMPREHENSIVE METABOLIC PANEL
ALT: 23 U/L (ref 0–53)
AST: 15 U/L (ref 0–37)
Albumin: 4.4 g/dL (ref 3.5–5.2)
Alkaline Phosphatase: 66 U/L (ref 39–117)
BUN: 14 mg/dL (ref 6–23)
CO2: 26 mEq/L (ref 19–32)
Calcium: 9.7 mg/dL (ref 8.4–10.5)
Chloride: 99 mEq/L (ref 96–112)
Creatinine, Ser: 1.02 mg/dL (ref 0.40–1.50)
GFR: 96.74 mL/min (ref >60.00)
Glucose, Bld: 268 mg/dL — ABNORMAL HIGH (ref 70–99)
Potassium: 4.2 mEq/L (ref 3.5–5.1)
Sodium: 136 mEq/L (ref 135–145)
Total Bilirubin: 0.3 mg/dL (ref 0.2–1.2)
Total Protein: 7.5 g/dL (ref 6.0–8.3)

## 2018-11-20 LAB — LIPID PANEL
Cholesterol: 139 mg/dL (ref 0–200)
HDL: 34.4 mg/dL — ABNORMAL LOW (ref >39.00)
LDL Cholesterol: 80 mg/dL (ref 0–99)
NonHDL: 104.24
Total CHOL/HDL Ratio: 4
Triglycerides: 120 mg/dL (ref 0.0–149.0)
VLDL: 24 mg/dL (ref 0.0–40.0)

## 2018-11-20 MED ORDER — TRULICITY 0.75 MG/0.5ML ~~LOC~~ SOAJ: 0.7500 mg | SUBCUTANEOUS | 0 refills | Status: DC

## 2018-11-20 NOTE — Progress Notes (Signed)
Jordan Cline is a 43 y.o. male with the following history as recorded in EpicCare:  Patient Active Problem List   Diagnosis Date Noted  . Morbid obesity ([place]) 12/06/2017  . Hyperlipidemia 09/26/2017  . Routine general medical examination at a health care facility 08/23/2017  . Erectile dysfunction associated with type 2 diabetes mellitus ([place]) 09/22/2014  . Type II diabetes mellitus ([place]) 05/13/2014  . OSA (obstructive sleep apnea) 05/13/2014  . Excessive daytime sleepiness 04/20/2014  . PFO (patent foramen ovale) 03/19/2014  . Borderline hypertension   . PE (pulmonary thromboembolism) ([place])     Current Outpatient Medications  Medication Sig Dispense Refill  . blood glucose meter kit and supplies KIT Dispense based on patient and insurance preference. Use up to four times daily as directed. (FOR ICD-9 250.00, 250.01). 1 each 0  . folic acid (FOLVITE) 1 MG tablet TAKE 1 TABLET BY MOUTH EVERY DAY 90 tablet 2  . glucose blood (ACCU-CHEK AVIVA PLUS) test strip USE AS DIRECTED 4 TIMES A DAY 100 each 2  . lisinopril (ZESTRIL) 10 MG tablet Take 1 tablet (10 mg total) by mouth daily. 90 tablet 3  . metFORMIN (GLUCOPHAGE) 500 MG tablet Take 1 tablet (500 mg total) by mouth 2 (two) times daily with a meal. 180 tablet 0  . rosuvastatin (CRESTOR) 10 MG tablet Take 1 tablet (10 mg total) by mouth daily. 90 tablet 3  . XARELTO 10 MG TABS tablet TAKE 1 TABLET BY MOUTH EVERY DAY 30 tablet 1  . Dulaglutide (TRULICITY) 9.62 XB/2.8UX SOPN Inject 0.75 mg into the skin once a week. 4 pen 0  . Insulin Glargine (BASAGLAR KWIKPEN) 100 UNIT/ML SOPN INJECT 0.18 MLS (18 UNITS TOTAL) INTO THE SKIN AT BEDTIME. 3 pen 1   No current facility-administered medications for this visit.     Allergies: Patient has no known allergies.  Past Medical History:  Diagnosis Date  . Acute deep vein thrombosis (DVT) of popliteal vein of right lower extremity ([place]) 03/2014  . Allergy   . Borderline hypertension   .  Diabetes mellitus type 2 in obese ([place]) 03/2014   HgbA1c 13.0  . Saddle embolus of pulmonary artery with acute cor pulmonale ([place]) 03/2014   s/p TPA  . Sleep apnea     History reviewed. No pertinent surgical history.  Family History  Problem Relation Age of Onset  . Arrhythmia Mother   . Breast cancer Mother   . Hyperlipidemia Mother   . Diabetes Father   . Hypertension Father   . Cancer Maternal Uncle   . Cancer Maternal Grandfather   . Breast cancer Maternal Grandmother   . Stroke Paternal Grandmother     Social History   Tobacco Use  . Smoking status: Former Smoker    Packs/day: 0.00    Years: 12.00    Pack years: 0.00    Types: Cigarettes    Quit date: 02/13/2014    Years since quitting: 4.7  . Smokeless tobacco: Never Used  Substance Use Topics  . Alcohol use: Yes    Alcohol/week: 0.0 standard drinks    Comment: occasionallly    Subjective:  3 month follow-up on Type 2 diabetes; notes that began to gain weight almost immediately when he started taking insulin again; history of non-compliance; Notes that blood sugar last week was 147 ( morning fasting); Denies any chest pain, shortness of breath, blurred vision or headache; no concerns for episodes of low blood sugar;     Objective:  Vitals:  11/20/18 0904  BP: 116/80  Pulse: 94  Temp: 98.5 F (36.9 C)  TempSrc: Oral  SpO2: 94%  Weight: (!) 320 lb 3.2 oz (145.2 kg)  Height: 6' (1.829 m)    General: Well developed, well nourished, in no acute distress  Skin : Warm and dry.  Head: Normocephalic and atraumatic  Lungs: Respirations unlabored; clear to auscultation bilaterally without wheeze, rales, rhonchi  CVS exam: normal rate and regular rhythm.  Extremities: No edema, cyanosis, clubbing  Vessels: Symmetric bilaterally  Neurologic: Alert and oriented; speech intact; face symmetrical; moves all extremities well; CNII-XII intact without focal deficit   Assessment:  1. Type 2 diabetes mellitus with  chronic kidney disease, without long-term current use of insulin, unspecified CKD stage ([place])   2. PE (pulmonary thromboembolism) ([facility]) Chronic  3. 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 Morbid obesity ([place]) Chronic    Plan:  1. Will add Trulicity weekly to Metformin and Basaglar; hopefully, he will be able to lose weight on Trulicity and can lower dosage of Basaglar. Plan for 3 month follow-up; 2. Continue with pulmonology; stay on medication; 4. Encouraged weight loss/ discussed benefits of Trulicity for weight as well.   No follow-ups on file.  Orders Placed This Encounter  Procedures  . Comp Met (CMET)    Standing Status:   Future    Number of Occurrences:   1    Standing Expiration Date:   11/20/2019  . HgB A1c    Standing Status:   Future    Number of Occurrences:   1    Standing Expiration Date:   11/20/2019  . Lipid panel    Standing Status:   Future    Number of Occurrences:   1    Standing Expiration Date:   11/20/2019    Requested Prescriptions   Signed Prescriptions Disp Refills  . Dulaglutide (TRULICITY) 7.57 VJ/2.8AS SOPN 4 pen 0    Sig: Inject 0.75 mg into the skin once a week.

## 2018-11-21 LAB — HEMOGLOBIN A1C
Hgb A1c MFr Bld: 11 % of total Hgb — ABNORMAL HIGH (ref <5.7)
Mean Plasma Glucose: 269 (calc)
eAG (mmol/L): 14.9 (calc)

## 2018-11-22 ENCOUNTER — Other Ambulatory Visit: Payer: Self-pay | Admitting: Family

## 2018-11-22 DIAGNOSIS — E1122 Type 2 diabetes mellitus with diabetic chronic kidney disease: Secondary | ICD-10-CM

## 2018-11-22 MED ORDER — METFORMIN HCL 500 MG PO TABS: 1000.0000 mg | ORAL_TABLET | Freq: Two times a day (BID) | ORAL | 0 refills | Status: DC

## 2018-11-27 ENCOUNTER — Other Ambulatory Visit: Payer: Self-pay | Admitting: Pulmonary Disease

## 2018-11-27 NOTE — Telephone Encounter (Signed)
Needs FU OV ASAP or can ask PCP to refill

## 2018-11-27 NOTE — Telephone Encounter (Signed)
Pt last seen 12/06/2017 with TP. Xarelto last filled 09/25/2018 30 tab x1 refill. Pt does not have any upcoming appointments with our practice.   RA, please advise on the refill of this medication. Thank you.

## 2018-12-19 ENCOUNTER — Other Ambulatory Visit: Payer: Self-pay | Admitting: Family

## 2018-12-19 NOTE — Telephone Encounter (Signed)
Will you please call him and check on his response to Trulicity? I got a refill request but we can increase the dose if he is doing okay. What is his weight? Blood sugars? Is he still taking insulin?

## 2018-12-23 NOTE — Telephone Encounter (Signed)
Called and left message for patient and also sent him my-chart message as well. Waiting response from patient.

## 2018-12-24 ENCOUNTER — Other Ambulatory Visit: Payer: Self-pay | Admitting: Family

## 2018-12-24 MED ORDER — TRULICITY 1.5 MG/0.5ML ~~LOC~~ SOAJ: 1.5000 mg | SUBCUTANEOUS | 3 refills | Status: DC

## 2018-12-25 NOTE — Telephone Encounter (Signed)
I called patient and left message today regarding new script being called in and for him to pick up today.

## 2018-12-31 ENCOUNTER — Ambulatory Visit: Payer: 59 | Admitting: Family

## 2019-01-11 DIAGNOSIS — H5213 Myopia, bilateral: Secondary | ICD-10-CM | POA: Diagnosis not present

## 2019-02-11 ENCOUNTER — Telehealth: Payer: Self-pay | Admitting: Pulmonary Disease

## 2019-02-11 NOTE — Telephone Encounter (Signed)
Spoke with pt, advised him that he needed an OV for Rx refill of Xarelto. He made an appt with [name] on 02/20/2018 for f/u. Nothing further is needed. He has enough medication until then.

## 2019-02-21 ENCOUNTER — Ambulatory Visit (INDEPENDENT_AMBULATORY_CARE_PROVIDER_SITE_OTHER): Payer: 59 | Admitting: Family

## 2019-02-21 ENCOUNTER — Other Ambulatory Visit: Payer: Self-pay

## 2019-02-21 ENCOUNTER — Encounter: Payer: Self-pay | Admitting: Family

## 2019-02-21 ENCOUNTER — Telehealth (INDEPENDENT_AMBULATORY_CARE_PROVIDER_SITE_OTHER): Payer: 59 | Admitting: Primary Care

## 2019-02-21 ENCOUNTER — Encounter: Payer: Self-pay | Admitting: Primary Care

## 2019-02-21 ENCOUNTER — Ambulatory Visit: Payer: 59 | Admitting: Family

## 2019-02-21 VITALS — BP 118/74 | HR 96 | Temp 98.0°F | Ht 72.0 in | Wt 320.0 lb

## 2019-02-21 DIAGNOSIS — G4733 Obstructive sleep apnea (adult) (pediatric): Secondary | ICD-10-CM | POA: Diagnosis not present

## 2019-02-21 DIAGNOSIS — I2699 Other pulmonary embolism without acute cor pulmonale: Secondary | ICD-10-CM

## 2019-02-21 DIAGNOSIS — I10 Essential (primary) hypertension: Secondary | ICD-10-CM

## 2019-02-21 DIAGNOSIS — E1122 Type 2 diabetes mellitus with diabetic chronic kidney disease: Secondary | ICD-10-CM | POA: Diagnosis not present

## 2019-02-21 LAB — POCT GLYCOSYLATED HEMOGLOBIN (HGB A1C): Hemoglobin A1C: 8 % — AB (ref 4.0–5.6)

## 2019-02-21 MED ORDER — RIVAROXABAN 10 MG PO TABS: 10.0000 mg | ORAL_TABLET | Freq: Every day | ORAL | 11 refills | Status: DC

## 2019-02-21 NOTE — Progress Notes (Signed)
Jordan Cline is a 44 y.o. male with the following history as recorded in EpicCare:  Patient Active Problem List   Diagnosis Date Noted  . Morbid obesity ([place]) 12/06/2017  . Hyperlipidemia 09/26/2017  . Routine general medical examination at a health care facility 08/23/2017  . Erectile dysfunction associated with type 2 diabetes mellitus ([place]) 09/22/2014  . Type II diabetes mellitus ([place]) 05/13/2014  . OSA (obstructive sleep apnea) 05/13/2014  . Excessive daytime sleepiness 04/20/2014  . PFO (patent foramen ovale) 03/19/2014  . Borderline hypertension   . PE (pulmonary thromboembolism) ([place])     Current Outpatient Medications  Medication Sig Dispense Refill  . blood glucose meter kit and supplies KIT Dispense based on patient and insurance preference. Use up to four times daily as directed. (FOR ICD-9 250.00, 250.01). 1 each 0  . Dulaglutide (TRULICITY) 1.5 BJ/6.2GB SOPN Inject 1.5 mg into the skin once a week. 4 pen 3  . folic acid (FOLVITE) 1 MG tablet TAKE 1 TABLET BY MOUTH EVERY DAY 90 tablet 2  . glucose blood (ACCU-CHEK AVIVA PLUS) test strip USE AS DIRECTED 4 TIMES A DAY 100 each 2  . lisinopril (ZESTRIL) 10 MG tablet Take 1 tablet (10 mg total) by mouth daily. 90 tablet 3  . metFORMIN (GLUCOPHAGE) 500 MG tablet Take 2 tablets (1,000 mg total) by mouth 2 (two) times daily with a meal. 360 tablet 0  . rosuvastatin (CRESTOR) 10 MG tablet Take 1 tablet (10 mg total) by mouth daily. 90 tablet 3  . XARELTO 10 MG TABS tablet TAKE 1 TABLET BY MOUTH EVERY DAY 30 tablet 0   No current facility-administered medications for this visit.    Allergies: Patient has no known allergies.  Past Medical History:  Diagnosis Date  . Acute deep vein thrombosis (DVT) of popliteal vein of right lower extremity ([place]) 03/2014  . Allergy   . Borderline hypertension   . Diabetes mellitus type 2 in obese ([place]) 03/2014   HgbA1c 13.0  . Saddle embolus of pulmonary artery with acute cor pulmonale  ([place]) 03/2014   s/p TPA  . Sleep apnea     History reviewed. No pertinent surgical history.  Family History  Problem Relation Age of Onset  . Arrhythmia Mother   . Breast cancer Mother   . Hyperlipidemia Mother   . Diabetes Father   . Hypertension Father   . Cancer Maternal Uncle   . Cancer Maternal Grandfather   . Breast cancer Maternal Grandmother   . Stroke Paternal Grandmother     Social History   Tobacco Use  . Smoking status: Former Smoker    Packs/day: 0.00    Years: 12.00    Pack years: 0.00    Types: Cigarettes    Quit date: 02/13/2014    Years since quitting: 5.0  . Smokeless tobacco: Never Used  Substance Use Topics  . Alcohol use: Yes    Alcohol/week: 0.0 standard drinks    Comment: occasionallly    Subjective:  3 month follow-up on Type 2 Diabetes; has been working to regain control; Hgba1c is down to 8 today- last check from 11 in October 2020; admits he has not been taking his insulin; also only taking his Metformin twice a day about 1/2 the time; tolerating Trulicity well; working on diet and exercise changes- very excited about his goals; Denies any chest pain, shortness of breath, blurred vision or headache.   Objective:  Vitals:   02/21/19 0910  BP: 118/74  Pulse:  96  Temp: 98 F (36.7 C)  TempSrc: Oral  SpO2: 96%  Weight: (!) 320 lb (145.2 kg)  Height: 6' (1.829 m)    General: Well developed, well nourished, in no acute distress  Skin : Warm and dry.  Head: Normocephalic and atraumatic  Lungs: Respirations unlabored; clear to auscultation bilaterally without wheeze, rales, rhonchi  CVS exam: normal rate and regular rhythm.  Neurologic: Alert and oriented; speech intact; face symmetrical; moves all extremities well; CNII-XII intact without focal deficit   Assessment:  1. Type 2 diabetes mellitus with chronic kidney disease, without long-term current use of insulin, unspecified CKD stage ([place])   2. Essential hypertension   3. PE (pulmonary  thromboembolism) ([place])   4. Morbid obesity ([place])     Plan:  1. Control improving; patient will work on taking his Metformin bid as prescribed; continue Trulicity; hold insulin for now; follow-up in 3 months; 2. Stable; continue same medication; 3. Scheduled to see pulmonology later today; 4. Continue working on weight loss goals;    No follow-ups on file.  No orders of the defined types were placed in this encounter.   Requested Prescriptions    No prescriptions requested or ordered in this encounter

## 2019-02-21 NOTE — Progress Notes (Signed)
Virtual Visit via Video Note  I connected with Jordan Cline on 02/21/19 at 10:30 AM EST by a video enabled telemedicine application and verified that I am speaking with the correct person using two identifiers.  Location: Patient: Home Provider: Home office    I discussed the limitations of evaluation and management by telemedicine and the availability of in person appointments. The patient expressed understanding and agreed to proceed.  History of Present Illness: 44 year old male, former smoker. PMH significant for severe OSA, submassive PE with RV strain, right DVT- requiring catheter directed thrombolysis in 2016 (maintained on xarelto). Hypercoagulability panel negative, only risk factor is obesity. Hematology recommended 2 years of Xarelto treatment. On CPAP, DME company is [employer].   02/21/2019 Patient contacted today for video visit for Xarelto refill. He is doing well with no acute complaints. He saw his PCP today and vitals were normal. He has tolerated xarelto well over the last several years. Reports inconsistent use of CPAP at times, knows he needs to wear it more. Denies HA's, bleeding, shortness of breath or chest pain.   Observations/Objective:  Patient reported vital signs: - BP 118/74 - HR 96  - Appears well. Patient able to speak in full sentences - No observed shortness of breath, wheezing or cough    TEST /Events  Critical illness February 2016 with submassive PE with right heart strain and right DVT requiring catheter directed thrombolysis, discharged on Xarelto.   Echo 03/2014 showed .Mild LVH, grade 1 diastolic dysfunction, RV moderately dilated with decreased systolic function. ? Bubble study difficult , possibly positive suggesting PFO.   CT chest angio 03/2014 >Saddle embolus would large emboli extending into both lower lobes. Emboli also noted in both upper lobes. Possible early lingular pulmonary infarct.  hypercoag panel neg  HST 3/  2016>AHI of 85  Venous Doppler December 2016- negative DVT   Assessment and Plan:  Submassive PE with RV strain, right DVT- requiring catheter directed thrombolysis in 2016. Per Dr. Reginia Naas last note in 2018 considering his risk factor of obesity and patient has not lost any significant amount of weight (327lbs in 2018) preference is to continue xarelto and decreased to 10mg  daily   PE/DVT - No acute issues, tolerating anticoagulation well  - Continue Xarelto 10mg  daily (refill sent) - Reviewed precautions to take while on anticoagulation therapy  - Monitor for new or increased bleeding or shortness of breath   Obesity: - Continue to work on weight loss and staying active  Sleep apnea - Non compliant with CPAP at times  - [facility] not available for review - Encourage patient consistently wear CPAP every night for 4-6 hours or more - DO not drive if experiencing excessive daytime fatigue - Any issues with mask fit or needing supplies contact our office or [employer]   Follow Up Instructions:   - FU in 1 year   I discussed the assessment and treatment plan with the patient. The patient was provided an opportunity to ask questions and all were answered. The patient agreed with the plan and demonstrated an understanding of the instructions.   The patient was advised to call back or seek an in-person evaluation if the symptoms worsen or if the condition fails to improve as anticipated.  I provided 18 minutes of non-face-to-face time during this encounter.   , NP

## 2019-02-21 NOTE — Addendum Note (Signed)
Addended by: Karma Ganja on: 02/21/2019 11:42 AM   Modules accepted: Orders

## 2019-02-21 NOTE — Patient Instructions (Addendum)
PE/DVT - Continue Xarelto 10mg  daily (refill sent) - Continue to work on weight loss - Monitor for new or increased bleeding or shortness of breath   Sleep apnea: - Aim to wear CPAP every night for 4-6 hours or more - DO not drive if experiencing excessive daytime fatigue - Any issues with mask fit or needing supplies contact our office or [facility]   Follow-up: 1 year with Dr. or sooner if needed    Bleeding Precautions When on Anticoagulant Therapy, Adult Anticoagulant therapy, also called blood thinner therapy, is medicine that helps to prevent and treat blood clots. The medicine works by stopping blood clots from forming or growing. Blood clots that form in your blood vessels can be dangerous. They can break loose and travel to the heart, lungs, or brain. This increases the risk of a heart attack, stroke, or blocked lung artery (pulmonary embolism). Anticoagulants also increase the risk of bleeding. Try to protect yourself from cuts and other injuries that can cause bleeding. It is important to take anticoagulants exactly as told by your health care provider. Why do I need to be on anticoagulant therapy? You may need this medicine if you are at risk of developing a blood clot. Conditions that increase your risk of a blood clot include:  Being born with heart disease or a heart malformation (congenital heart disease).  Developing heart disease.  Having had surgery, such as valve replacement.  Having had a serious accident or other type of severe injury (trauma).  Having certain types of cancer.  Having certain diseases that can increase blood clotting.  Having a high risk of stroke or heart attack.  Having atrial fibrillation (AF). What are the common anticoagulant medicines? There are several types of anticoagulant medicines. The most common types are:  Medicines that you take by mouth (oral medicines), such as: ? Warfarin. ? Novel oral anticoagulants (NOACs), such  as:  Direct thrombin inhibitors (dabigatran).  Factor Xa inhibitors (apixaban, edoxaban, and rivaroxaban).  Injections, such as: ? Unfractionated heparin. ? Low molecular weight heparin. These anticoagulants work in different ways to prevent blood clots. They also have different risks and side effects. What do I need to remember while on anticoagulant therapy? Taking anticoagulants  Take your medicine at the same time every day. If you forget to take your medicine, take it as soon as you remember. Do not double your dosage of medicine if you miss a whole day. Take your normal dose and call your health care provider.  Do not stop taking your medicine unless your health care provider approves. Stopping the medicine can increase your risk of developing a blood clot. Taking other medicines  Take over-the-counter and prescriptions medicines only as told by your health care provider.  Do not take over-the-counter NSAIDs, including aspirin and ibuprofen, while you are on anticoagulant therapy. These medicines increase your risk of dangerous bleeding.  Get approval from your health care provider before you start taking any new medicines, vitamins, or herbal products. Some of these could interfere with your therapy. General instructions  Keep all follow-up visits as told by your health care provider. This is important.  If you are pregnant or trying to get pregnant, talk with a health care provider about anticoagulants. Some of these medicines are not safe to take during pregnancy.  Tell all health care providers, including your dentist, that you are on anticoagulant therapy. It is especially important to tell providers before you have any surgery, medical procedures, or dental  work done. What precautions should I take?   Be very careful when using knives, scissors, or other sharp objects.  Use an electric razor instead of a blade.  Do not use toothpicks.  Use a soft-bristled  toothbrush. Brush your teeth gently.  Always wear shoes outdoors and wear slippers indoors.  Be careful when cutting your fingernails and toenails.  Place bath mats in the bathroom. If possible, install handrails as well.  Wear gloves while you do yard work.  Wear your seat belt.  Prevent falls by removing loose rugs and extension cords from areas where you walk. Use a cane or walker if you need it.  Avoid constipation by: ? Drinking enough fluid to keep your urine clear or pale yellow. ? Eating foods that are high in fiber, such as fresh fruits and vegetables, whole grains, and beans. ? Limiting foods that are high in fat and processed sugars, such as fried and sweet foods.  Do not play contact sports or participate in other activities that have a high risk for injury. What other precautions are important if on warfarin therapy? If you are taking a type of anticoagulant called warfarin, make sure you:  Work with a diet and nutrition specialist (dietitian) to make an eating plan. Do not make any sudden changes to your diet after you have started your eating plan.  Do not drink alcohol. It can interfere with your medicine and increase your risk of an injury that causes bleeding.  Get regular blood tests as told by your health care provider. What are some questions to ask my health care provider?  Why do I need anticoagulant therapy?  What is the best anticoagulant therapy for my condition?  How long will I need anticoagulant therapy?  What are the side effects of anticoagulant therapy?  When should I take my medicine? What should I do if I forget to take it?  Will I need to have regular blood tests?  Do I need to change my diet? Are there foods or drinks that I should avoid?  What activities are safe for me?  What should I do if I want to get pregnant? Contact a health care provider if:  You miss a dose of medicine: ? And you are not sure what to do. ? For more than  one day.  You have: ? Menstrual bleeding that is heavier than normal. ? Bloody or brown urine. ? Easy bruising. ? Black and tarry stool or bright red stool. ? Side effects from your medicine.  You feel weak or dizzy.  You become pregnant. Get help right away if:  You have bleeding that will not stop within 20 minutes from: ? The nose. ? The gums. ? A cut on the skin.  You have a severe headache or stomachache.  You vomit or cough up blood.  You fall or hit your head. Summary  Anticoagulant therapy, also called blood thinner therapy, is medicine that helps to prevent and treat blood clots.  Anticoagulants work in different ways to prevent blood clots. They also have different risks and side effects. Talk with your health care provider about any precautions that you should take while on anticoagulant therapy.   Sleep Apnea Sleep apnea affects breathing during sleep. It causes breathing to stop for a short time or to become shallow. It can also increase the risk of:  Heart attack.  Stroke.  Being very overweight (obese).  Diabetes.  Heart failure.  Irregular heartbeat. The goal of  treatment is to help you breathe normally again. What are the causes? There are three kinds of sleep apnea:  Obstructive sleep apnea. This is caused by a blocked or collapsed airway.  Central sleep apnea. This happens when the brain does not send the right signals to the muscles that control breathing.  Mixed sleep apnea. This is a combination of obstructive and central sleep apnea. The most common cause of this condition is a collapsed or blocked airway. This can happen if:  Your throat muscles are too relaxed.  Your tongue and tonsils are too large.  You are overweight.  Your airway is too small. What increases the risk?  Being overweight.  Smoking.  Having a small airway.  Being older.  Being male.  Drinking alcohol.  Taking medicines to calm yourself (sedatives  or tranquilizers).  Having family members with the condition. What are the signs or symptoms?  Trouble staying asleep.  Being sleepy or tired during the day.  Getting angry a lot.  Loud snoring.  Headaches in the morning.  Not being able to focus your mind (concentrate).  Forgetting things.  Less interest in sex.  Mood swings.  Personality changes.  Feelings of sadness (depression).  Waking up a lot during the night to pee (urinate).  Dry mouth.  Sore throat. How is this diagnosed?  Your medical history.  A physical exam.  A test that is done when you are sleeping (sleep study). The test is most often done in a sleep lab but may also be done at home. How is this treated?   Sleeping on your side.  Using a medicine to get rid of mucus in your nose (decongestant).  Avoiding the use of alcohol, medicines to help you relax, or certain pain medicines (narcotics).  Losing weight, if needed.  Changing your diet.  Not smoking.  Using a machine to open your airway while you sleep, such as: ? An oral appliance. This is a mouthpiece that shifts your lower jaw forward. ? A CPAP device. This device blows air through a mask when you breathe out (exhale). ? An EPAP device. This has valves that you put in each nostril. ? A BPAP device. This device blows air through a mask when you breathe in (inhale) and breathe out.  Having surgery if other treatments do not work. It is important to get treatment for sleep apnea. Without treatment, it can lead to:  High blood pressure.  Coronary artery disease.  In men, not being able to have an erection (impotence).  Reduced thinking ability. Follow these instructions at home: Lifestyle  Make changes that your doctor recommends.  Eat a healthy diet.  Lose weight if needed.  Avoid alcohol, medicines to help you relax, and some pain medicines.  Do not use any products that contain nicotine or tobacco, such as  cigarettes, e-cigarettes, and chewing tobacco. If you need help quitting, ask your doctor. General instructions  Take over-the-counter and prescription medicines only as told by your doctor.  If you were given a machine to use while you sleep, use it only as told by your doctor.  If you are having surgery, make sure to tell your doctor you have sleep apnea. You may need to bring your device with you.  Keep all follow-up visits as told by your doctor. This is important. Contact a doctor if:  The machine that you were given to use during sleep bothers you or does not seem to be working.  You do  not get better.  You get worse. Get help right away if:  Your chest hurts.  You have trouble breathing in enough air.  You have an uncomfortable feeling in your back, arms, or stomach.  You have trouble talking.  One side of your body feels weak.  A part of your face is hanging down. These symptoms may be an emergency. Do not wait to see if the symptoms will go away. Get medical help right away. Call your local emergency services (911 in the [place] Do not drive yourself to the hospital. Summary  This condition affects breathing during sleep.  The most common cause is a collapsed or blocked airway.  The goal of treatment is to help you breathe normally while you sleep. This information is not intended to replace advice given to you by your health care provider. Make sure you discuss any questions you have with your health care provider. Document Revised: 11/16/2017 Document Reviewed: 09/25/2017 Elsevier Patient Education  [employer].   This information is not intended to replace advice given to you by your health care provider. Make sure you discuss any questions you have with your health care provider. Document Revised: 05/22/2018 Document Reviewed: 04/18/2016 Elsevier Patient Education  2020 [employer].

## 2019-03-28 ENCOUNTER — Other Ambulatory Visit: Payer: Self-pay | Admitting: Family

## 2019-03-28 DIAGNOSIS — E1122 Type 2 diabetes mellitus with diabetic chronic kidney disease: Secondary | ICD-10-CM

## 2019-03-28 MED ORDER — METFORMIN HCL 500 MG PO TABS: 1000.0000 mg | ORAL_TABLET | Freq: Two times a day (BID) | ORAL | 0 refills | Status: DC

## 2019-04-28 ENCOUNTER — Other Ambulatory Visit: Payer: Self-pay | Admitting: Family

## 2019-05-10 DIAGNOSIS — G4733 Obstructive sleep apnea (adult) (pediatric): Secondary | ICD-10-CM | POA: Diagnosis not present

## 2019-05-21 ENCOUNTER — Ambulatory Visit (INDEPENDENT_AMBULATORY_CARE_PROVIDER_SITE_OTHER): Payer: 59 | Admitting: Family

## 2019-05-21 ENCOUNTER — Other Ambulatory Visit: Payer: Self-pay

## 2019-05-21 ENCOUNTER — Encounter: Payer: Self-pay | Admitting: Family

## 2019-05-21 VITALS — BP 124/78 | HR 88 | Temp 98.0°F | Ht 73.0 in | Wt 324.0 lb

## 2019-05-21 DIAGNOSIS — E1122 Type 2 diabetes mellitus with diabetic chronic kidney disease: Secondary | ICD-10-CM | POA: Diagnosis not present

## 2019-05-21 LAB — COMPREHENSIVE METABOLIC PANEL
ALT: 27 U/L (ref 0–53)
AST: 19 U/L (ref 0–37)
Albumin: 4.2 g/dL (ref 3.5–5.2)
Alkaline Phosphatase: 55 U/L (ref 39–117)
BUN: 11 mg/dL (ref 6–23)
CO2: 27 mEq/L (ref 19–32)
Calcium: 9.4 mg/dL (ref 8.4–10.5)
Chloride: 101 mEq/L (ref 96–112)
Creatinine, Ser: 0.9 mg/dL (ref 0.40–1.50)
GFR: 111.51 mL/min (ref >60.00)
Glucose, Bld: 177 mg/dL — ABNORMAL HIGH (ref 70–99)
Potassium: 4.1 mEq/L (ref 3.5–5.1)
Sodium: 135 mEq/L (ref 135–145)
Total Bilirubin: 0.3 mg/dL (ref 0.2–1.2)
Total Protein: 7.1 g/dL (ref 6.0–8.3)

## 2019-05-21 LAB — CBC WITH DIFFERENTIAL/PLATELET
Basophils Absolute: 0 10*3/uL (ref 0.0–0.1)
Basophils Relative: 0.5 % (ref 0.0–3.0)
Eosinophils Absolute: 0.3 10*3/uL (ref 0.0–0.7)
Eosinophils Relative: 4.8 % (ref 0.0–5.0)
HCT: 42.5 % (ref 39.0–52.0)
Hemoglobin: 14.2 g/dL (ref 13.0–17.0)
Lymphocytes Relative: 28.8 % (ref 12.0–46.0)
Lymphs Abs: 1.6 10*3/uL (ref 0.7–4.0)
MCHC: 33.4 g/dL (ref 30.0–36.0)
MCV: 80.1 fl (ref 78.0–100.0)
Monocytes Absolute: 0.5 10*3/uL (ref 0.1–1.0)
Monocytes Relative: 9.4 % (ref 3.0–12.0)
Neutro Abs: 3.1 10*3/uL (ref 1.4–7.7)
Neutrophils Relative %: 56.5 % (ref 43.0–77.0)
Platelets: 306 10*3/uL (ref 150.0–400.0)
RBC: 5.3 Mil/uL (ref 4.22–5.81)
RDW: 16.1 % — ABNORMAL HIGH (ref 11.5–15.5)
WBC: 5.5 10*3/uL (ref 4.0–10.5)

## 2019-05-21 LAB — HEMOGLOBIN A1C: Hgb A1c MFr Bld: 7.7 % — ABNORMAL HIGH (ref 4.6–6.5)

## 2019-05-21 NOTE — Progress Notes (Signed)
Jordan Cline is a 44 y.o. male with the following history as recorded in EpicCare:  Patient Active Problem List   Diagnosis Date Noted  . Morbid obesity ([place]) 12/06/2017  . Hyperlipidemia 09/26/2017  . Routine general medical examination at a health care facility 08/23/2017  . Erectile dysfunction associated with type 2 diabetes mellitus ([place]) 09/22/2014  . Type II diabetes mellitus ([place]) 05/13/2014  . OSA (obstructive sleep apnea) 05/13/2014  . Excessive daytime sleepiness 04/20/2014  . PFO (patent foramen ovale) 03/19/2014  . Borderline hypertension   . PE (pulmonary thromboembolism) ([place])     Current Outpatient Medications  Medication Sig Dispense Refill  . blood glucose meter kit and supplies KIT Dispense based on patient and insurance preference. Use up to four times daily as directed. (FOR ICD-9 250.00, 250.01). 1 each 0  . Dulaglutide (TRULICITY) 1.5 TM/5.4YT SOPN Inject 1.5 mg into the skin once a week. 4 pen 3  . folic acid (FOLVITE) 1 MG tablet TAKE 1 TABLET BY MOUTH EVERY DAY 90 tablet 2  . glucose blood (ACCU-CHEK AVIVA PLUS) test strip USE AS DIRECTED 4 TIMES A DAY 100 each 2  . lisinopril (ZESTRIL) 10 MG tablet Take 1 tablet (10 mg total) by mouth daily. 90 tablet 3  . metFORMIN (GLUCOPHAGE) 500 MG tablet Take 2 tablets (1,000 mg total) by mouth 2 (two) times daily with a meal. 360 tablet 0  . rivaroxaban (XARELTO) 10 MG TABS tablet Take 1 tablet (10 mg total) by mouth daily. 30 tablet 11  . rosuvastatin (CRESTOR) 10 MG tablet Take 1 tablet (10 mg total) by mouth daily. 90 tablet 3   No current facility-administered medications for this visit.    Allergies: Patient has no known allergies.  Past Medical History:  Diagnosis Date  . Acute deep vein thrombosis (DVT) of popliteal vein of right lower extremity ([place]) 03/2014  . Allergy   . Borderline hypertension   . Diabetes mellitus type 2 in obese ([place]) 03/2014   HgbA1c 13.0  . 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 Sleep apnea     No past surgical history on file.  Family History  Problem Relation Age of Onset  . Arrhythmia Mother   . Breast cancer Mother   . Hyperlipidemia Mother   . Diabetes Father   . Hypertension Father   . Cancer Maternal Uncle   . Cancer Maternal Grandfather   . Breast cancer Maternal Grandmother   . Stroke Paternal Grandmother     Social History   Tobacco Use  . Smoking status: Former Smoker    Packs/day: 0.00    Years: 12.00    Pack years: 0.00    Types: Cigarettes    Quit date: 02/13/2014    Years since quitting: 5.2  . Smokeless tobacco: Never Used  Substance Use Topics  . Alcohol use: Yes    Alcohol/week: 0.0 standard drinks    Comment: occasionallly    Subjective:  Presents for 3 month follow-up on Type 2 Diabetes; no concerns for episodes of low blood sugar; Denies any chest pain, shortness of breath, blurred vision or headache. Admits that not taking Metformin twice a day as prescribed- forgets his early morning dosage; is taking Trulicity regularly; prefers not to use insulin unless absolutely necessary; Has a goal to start exercising more regularly as well now that the weather is better.     Objective:  Vitals:   05/21/19  0858  BP: 124/78  Pulse: 88  Temp: 98 F (36.7 C)  TempSrc: Oral  SpO2: 97%  Weight: (!) 324 lb (147 kg)  Height: 6' 1" (1.854 m)    General: Well developed, well nourished, in no acute distress  Skin : Warm and dry.  Head: Normocephalic and atraumatic  Lungs: Respirations unlabored; clear to auscultation bilaterally without wheeze, rales, rhonchi  CVS exam: normal rate and regular rhythm.  Vessels: Symmetric bilaterally  Neurologic: Alert and oriented; speech intact; face symmetrical; moves all extremities well; CNII-XII intact without focal deficit   Assessment:  1. Type 2 diabetes mellitus with chronic kidney disease, without long-term current use of insulin,  unspecified CKD stage ([place])     Plan:  Update labs today; will adjust medications as needed; discussed setting a timer to help him take Metformin mid- day; work on exercise/ weight loss goals; follow-up in 3-4 months;  This visit occurred during the SARS-CoV-2 public health emergency.  Safety protocols were in place, including screening questions prior to the visit, additional usage of staff PPE, and extensive cleaning of exam room while observing appropriate contact time as indicated for disinfecting solutions.      No follow-ups on file.  Orders Placed This Encounter  Procedures  . CBC with Differential/Platelet  . Comp Met (CMET)  . HgB A1c    Requested Prescriptions    No prescriptions requested or ordered in this encounter

## 2019-07-04 DIAGNOSIS — G4733 Obstructive sleep apnea (adult) (pediatric): Secondary | ICD-10-CM | POA: Diagnosis not present

## 2019-08-31 ENCOUNTER — Other Ambulatory Visit: Payer: Self-pay | Admitting: Family

## 2019-09-10 ENCOUNTER — Ambulatory Visit (INDEPENDENT_AMBULATORY_CARE_PROVIDER_SITE_OTHER): Payer: No Typology Code available for payment source | Admitting: Family

## 2019-09-10 ENCOUNTER — Other Ambulatory Visit: Payer: Self-pay

## 2019-09-10 ENCOUNTER — Encounter: Payer: Self-pay | Admitting: Family

## 2019-09-10 VITALS — BP 122/80 | HR 97 | Temp 98.9°F | Wt 316.8 lb

## 2019-09-10 DIAGNOSIS — E785 Hyperlipidemia, unspecified: Secondary | ICD-10-CM | POA: Diagnosis not present

## 2019-09-10 DIAGNOSIS — E1122 Type 2 diabetes mellitus with diabetic chronic kidney disease: Secondary | ICD-10-CM

## 2019-09-10 DIAGNOSIS — I10 Essential (primary) hypertension: Secondary | ICD-10-CM

## 2019-09-10 DIAGNOSIS — E118 Type 2 diabetes mellitus with unspecified complications: Secondary | ICD-10-CM

## 2019-09-10 DIAGNOSIS — Z125 Encounter for screening for malignant neoplasm of prostate: Secondary | ICD-10-CM | POA: Diagnosis not present

## 2019-09-10 MED ORDER — TRULICITY 1.5 MG/0.5ML ~~LOC~~ SOAJ: 1.5000 mg | SUBCUTANEOUS | 6 refills | Status: DC

## 2019-09-10 MED ORDER — LISINOPRIL 10 MG PO TABS: 10.0000 mg | ORAL_TABLET | Freq: Every day | ORAL | 3 refills | Status: DC

## 2019-09-10 MED ORDER — METFORMIN HCL 500 MG PO TABS: 1000.0000 mg | ORAL_TABLET | Freq: Two times a day (BID) | ORAL | 0 refills | Status: DC

## 2019-09-10 MED ORDER — ROSUVASTATIN CALCIUM 10 MG PO TABS: 10.0000 mg | ORAL_TABLET | Freq: Every day | ORAL | 3 refills | Status: DC

## 2019-09-10 NOTE — Progress Notes (Signed)
Jordan Cline is a 44 y.o. male with the following history as recorded in EpicCare:  Patient Active Problem List   Diagnosis Date Noted  . Morbid obesity ([place]) 12/06/2017  . Hyperlipidemia 09/26/2017  . Routine general medical examination at a health care facility 08/23/2017  . Erectile dysfunction associated with type 2 diabetes mellitus ([place]) 09/22/2014  . Type II diabetes mellitus ([place]) 05/13/2014  . OSA (obstructive sleep apnea) 05/13/2014  . Excessive daytime sleepiness 04/20/2014  . PFO (patent foramen ovale) 03/19/2014  . Borderline hypertension   . PE (pulmonary thromboembolism) ([place])     Current Outpatient Medications  Medication Sig Dispense Refill  . blood glucose meter kit and supplies KIT Dispense based on patient and insurance preference. Use up to four times daily as directed. (FOR ICD-9 250.00, 250.01). 1 each 0  . Dulaglutide (TRULICITY) 1.5 DG/3.8VF SOPN Inject 0.5 mLs (1.5 mg total) into the skin once a week. 4 pen 6  . folic acid (FOLVITE) 1 MG tablet TAKE 1 TABLET BY MOUTH EVERY DAY 90 tablet 2  . glucose blood (ACCU-CHEK AVIVA PLUS) test strip USE AS DIRECTED 4 TIMES A DAY 100 each 2  . lisinopril (ZESTRIL) 10 MG tablet Take 1 tablet (10 mg total) by mouth daily. 90 tablet 3  . metFORMIN (GLUCOPHAGE) 500 MG tablet Take 2 tablets (1,000 mg total) by mouth 2 (two) times daily with a meal. Patient is taking 2 at bedtime 360 tablet 0  . rivaroxaban (XARELTO) 10 MG TABS tablet Take 1 tablet (10 mg total) by mouth daily. 30 tablet 11  . rosuvastatin (CRESTOR) 10 MG tablet Take 1 tablet (10 mg total) by mouth daily. 90 tablet 3   No current facility-administered medications for this visit.    Allergies: Patient has no known allergies.  Past Medical History:  Diagnosis Date  . Acute deep vein thrombosis (DVT) of popliteal vein of right lower extremity ([place]) 03/2014  . Allergy   . Borderline hypertension   . Diabetes mellitus type 2 in obese ([place]) 03/2014    HgbA1c 13.0  . Saddle embolus of pulmonary artery with acute cor pulmonale ([place]) 03/2014   s/p TPA  . Sleep apnea     No past surgical history on file.  Family History  Problem Relation Age of Onset  . Arrhythmia Mother   . Breast cancer Mother   . Hyperlipidemia Mother   . Diabetes Father   . Hypertension Father   . Cancer Maternal Uncle   . Cancer Maternal Grandfather   . Breast cancer Maternal Grandmother   . Stroke Paternal Grandmother     Social History   Tobacco Use  . Smoking status: Former Smoker    Packs/day: 0.00    Years: 12.00    Pack years: 0.00    Types: Cigarettes    Quit date: 02/13/2014    Years since quitting: 5.5  . Smokeless tobacco: Never Used  Substance Use Topics  . Alcohol use: Yes    Alcohol/week: 0.0 standard drinks    Comment: occasionallly    Subjective:  3 month follow-up on Type 2 Diabetes/ hypertension/ hyperlipidemia; has lost 8 pounds since his last OV; needs updated labs today and requesting medication refills. Only taking Metformin 2 at night;   Objective:  Vitals:   09/10/19 0931  BP: 122/80  Pulse: 97  Temp: 98.9 F (37.2 C)  TempSrc: Oral  SpO2: 97%  Weight: (!) 316 lb 12.8 oz (143.7 kg)    General: Well developed,  well nourished, in no acute distress  Skin : Warm and dry.  Head: Normocephalic and atraumatic  Eyes: Sclera and conjunctiva clear; pupils round and reactive to light; extraocular movements intact  Lungs: Respirations unlabored; clear to auscultation bilaterally without wheeze, rales, rhonchi  CVS exam: normal rate and regular rhythm.  Musculoskeletal: No deformities; no active joint inflammation  Extremities: No edema, cyanosis, clubbing  Vessels: Symmetric bilaterally  Neurologic: Alert and oriented; speech intact; face symmetrical; moves all extremities well; CNII-XII intact without focal deficit   Assessment:  1. Type 2 diabetes mellitus with chronic kidney disease, without long-term current use of insulin,  unspecified CKD stage ([place])   2. Type 2 diabetes mellitus with complication ([facility])   3. Essential hypertension   4. Hyperlipidemia, unspecified hyperlipidemia type   5. Prostate cancer screening     Plan:  Congratulated patient on commitment to his health; refills and labs updated today; plan for 6 month follow-up, sooner prn.   This visit occurred during the SARS-CoV-2 public health emergency.  Safety protocols were in place, including screening questions prior to the visit, additional usage of staff PPE, and extensive cleaning of exam room while observing appropriate contact time as indicated for disinfecting solutions.     Return in about 6 months (around 03/12/2020).  Orders Placed This Encounter  Procedures  . CBC with Differential/Platelet    Standing Status:   Future    Standing Expiration Date:   09/09/2020  . Comp Met (CMET)    Standing Status:   Future    Standing Expiration Date:   09/09/2020  . Lipid panel    Standing Status:   Future    Standing Expiration Date:   09/09/2020  . HgB A1c    Standing Status:   Future    Standing Expiration Date:   09/09/2020  . PSA    Standing Status:   Future    Standing Expiration Date:   09/09/2020    Requested Prescriptions   Signed Prescriptions Disp Refills  . lisinopril (ZESTRIL) 10 MG tablet 90 tablet 3    Sig: Take 1 tablet (10 mg total) by mouth daily.  . rosuvastatin (CRESTOR) 10 MG tablet 90 tablet 3    Sig: Take 1 tablet (10 mg total) by mouth daily.  . metFORMIN (GLUCOPHAGE) 500 MG tablet 360 tablet 0    Sig: Take 2 tablets (1,000 mg total) by mouth 2 (two) times daily with a meal. Patient is taking 2 at bedtime  . Dulaglutide (TRULICITY) 1.5 TM/5.4YT SOPN 4 pen 6    Sig: Inject 0.5 mLs (1.5 mg total) into the skin once a week.

## 2019-09-11 LAB — COMPREHENSIVE METABOLIC PANEL
AG Ratio: 1.8 (calc) (ref 1.0–2.5)
ALT: 30 U/L (ref 9–46)
AST: 21 U/L (ref 10–40)
Albumin: 4.5 g/dL (ref 3.6–5.1)
Alkaline phosphatase (APISO): 58 U/L (ref 36–130)
BUN: 11 mg/dL (ref 7–25)
CO2: 24 mmol/L (ref 20–32)
Calcium: 9.6 mg/dL (ref 8.6–10.3)
Chloride: 103 mmol/L (ref 98–110)
Creat: 0.97 mg/dL (ref 0.60–1.35)
Globulin: 2.5 g/dL (calc) (ref 1.9–3.7)
Glucose, Bld: 129 mg/dL — ABNORMAL HIGH (ref 65–99)
Potassium: 4.4 mmol/L (ref 3.5–5.3)
Sodium: 137 mmol/L (ref 135–146)
Total Bilirubin: 0.4 mg/dL (ref 0.2–1.2)
Total Protein: 7 g/dL (ref 6.1–8.1)

## 2019-09-11 LAB — CBC WITH DIFFERENTIAL/PLATELET
Absolute Monocytes: 435 cells/uL (ref 200–950)
Basophils Absolute: 17 cells/uL (ref 0–200)
Basophils Relative: 0.3 %
Eosinophils Absolute: 273 cells/uL (ref 15–500)
Eosinophils Relative: 4.7 %
HCT: 45.7 % (ref 38.5–50.0)
Hemoglobin: 14.8 g/dL (ref 13.2–17.1)
Lymphs Abs: 1781 cells/uL (ref 850–3900)
MCH: 26.2 pg — ABNORMAL LOW (ref 27.0–33.0)
MCHC: 32.4 g/dL (ref 32.0–36.0)
MCV: 80.9 fL (ref 80.0–100.0)
MPV: 9.3 fL (ref 7.5–12.5)
Monocytes Relative: 7.5 %
Neutro Abs: 3294 cells/uL (ref 1500–7800)
Neutrophils Relative %: 56.8 %
Platelets: 346 10*3/uL (ref 140–400)
RBC: 5.65 10*6/uL (ref 4.20–5.80)
RDW: 16.1 % — ABNORMAL HIGH (ref 11.0–15.0)
Total Lymphocyte: 30.7 %
WBC: 5.8 10*3/uL (ref 3.8–10.8)

## 2019-09-11 LAB — LIPID PANEL
Cholesterol: 131 mg/dL (ref <200)
HDL: 34 mg/dL — ABNORMAL LOW (ref >=40)
LDL Cholesterol (Calc): 76 mg/dL (calc)
Non-HDL Cholesterol (Calc): 97 mg/dL (calc) (ref <130)
Total CHOL/HDL Ratio: 3.9 (calc) (ref <5.0)
Triglycerides: 126 mg/dL (ref <150)

## 2019-09-11 LAB — HEMOGLOBIN A1C
Hgb A1c MFr Bld: 8.5 % of total Hgb — ABNORMAL HIGH (ref <5.7)
Mean Plasma Glucose: 197 (calc)
eAG (mmol/L): 10.9 (calc)

## 2019-09-11 LAB — PSA: PSA: 0.3 ng/mL (ref <=4.0)

## 2019-12-29 ENCOUNTER — Other Ambulatory Visit: Payer: Self-pay | Admitting: Family

## 2019-12-29 DIAGNOSIS — E1122 Type 2 diabetes mellitus with diabetic chronic kidney disease: Secondary | ICD-10-CM

## 2019-12-29 NOTE — Telephone Encounter (Signed)
Please advise 

## 2019-12-30 ENCOUNTER — Encounter: Payer: Self-pay | Admitting: Family

## 2020-02-23 ENCOUNTER — Telehealth: Payer: Self-pay | Admitting: Primary Care

## 2020-02-23 ENCOUNTER — Other Ambulatory Visit: Payer: Self-pay | Admitting: Primary Care

## 2020-02-23 NOTE — Telephone Encounter (Signed)
Pt requesting to change ov on 03/01/20 to televisit. Please advise if this is ok.

## 2020-02-24 NOTE — Telephone Encounter (Signed)
LMTCB for pt 

## 2020-02-24 NOTE — Telephone Encounter (Signed)
Yes that is fine, but if he has an SD card on CPAP and he has been using CPAP nightly he needs drop off card to our office or DME card for download prior to televisit

## 2020-02-25 NOTE — Telephone Encounter (Signed)
Patient appointment changed to TELEVISIT. Patient phone number is [phone].

## 2020-02-25 NOTE — Telephone Encounter (Signed)
Called and spoke with Patient.  Patient had requested a televisit with [name],NP for medication management.  Patient was already scheduled televisit 03/01/20 with [name],NP.  Patient aware someone will call him around OV time.  Nothing further at this time.

## 2020-03-01 ENCOUNTER — Other Ambulatory Visit: Payer: Self-pay | Admitting: Primary Care

## 2020-03-01 ENCOUNTER — Ambulatory Visit: Payer: No Typology Code available for payment source | Admitting: Primary Care

## 2020-03-01 NOTE — Telephone Encounter (Signed)
Pt is scheduled for a televisit tomorrow 03/02/20 with [name]. If the visit is able to take place, we can refill pt's Xarelto during that visit. Will route to [facility] also as an FYI that pt is needing this to be refilled.

## 2020-03-02 ENCOUNTER — Ambulatory Visit: Payer: No Typology Code available for payment source | Admitting: Primary Care

## 2020-03-02 ENCOUNTER — Telehealth: Payer: Self-pay | Admitting: Primary Care

## 2020-03-02 MED ORDER — RIVAROXABAN 10 MG PO TABS: 10.0000 mg | ORAL_TABLET | Freq: Every day | ORAL | 0 refills | Status: DC

## 2020-03-02 NOTE — Telephone Encounter (Signed)
Sent refill in for xarelto

## 2020-03-02 NOTE — Telephone Encounter (Signed)
lmtcb for pt.  Refill sent to preferred pharmacy for Xarelto for 30 days with no refills. Can send in for a year after pt sees [name] and pt is advised to continue on the medication.

## 2020-03-03 ENCOUNTER — Encounter: Payer: Self-pay | Admitting: Primary Care

## 2020-03-03 ENCOUNTER — Other Ambulatory Visit: Payer: Self-pay

## 2020-03-03 ENCOUNTER — Ambulatory Visit (INDEPENDENT_AMBULATORY_CARE_PROVIDER_SITE_OTHER): Payer: No Typology Code available for payment source | Admitting: Primary Care

## 2020-03-03 DIAGNOSIS — G4733 Obstructive sleep apnea (adult) (pediatric): Secondary | ICD-10-CM | POA: Diagnosis not present

## 2020-03-03 DIAGNOSIS — I2699 Other pulmonary embolism without acute cor pulmonale: Secondary | ICD-10-CM

## 2020-03-03 MED ORDER — RIVAROXABAN 10 MG PO TABS: 10.0000 mg | ORAL_TABLET | Freq: Every day | ORAL | 11 refills | Status: DC

## 2020-03-03 NOTE — Progress Notes (Signed)
Virtual Visit via Telephone Note  I connected with Jordan Cline on 03/03/20 at  4:30 PM EST by telephone and verified that I am speaking with the correct person using two identifiers.  Location: Patient: Home Provider: Office   I discussed the limitations, risks, security and privacy concerns of performing an evaluation and management service by telephone and the availability of in person appointments. I also discussed with the patient that there may be a patient responsible charge related to this service. The patient expressed understanding and agreed to proceed.   History of Present Illness: 45 year old male, former smoker. PMH significant for severe OSA, submassive PE with RV strain, right DVT- requiring catheter directed thrombolysis in 2016 (maintained on xarelto). Hypercoagulability panel negative, only risk factor is obesity. Hematology recommended 2 years of Xarelto treatment. On CPAP, DME company is [employer].   03/03/2020  Patient contacted today for 1 year follow-up OSA/PE. He is compliant with anticoagulation medication. He needs refill of Xarelto 10mg  daily. He reports no bleeding while on anticoagulation.   Over the last month he has worn his CPAP approximately 10 times. He does reports feeling better when he wears his CPAP mask. Wakes up to use restroom and will forget to put it back on. No download available. He owns his machine, needs new supplies. He continues to work on weight loss, current weight reported at 307 lb.    Observations/Objective:  - Able to speak in full sentences; no overt shortness of breath, wheezing or cough - No vitals taken   Assessment and Plan:  PE/DVT - Submassive PE with RV strain, right DVT- requiring catheter directed thrombolysis in 2016. Treatment options were discussed with patient by Dr. 2017 back in 2018 and it was felt that he should continue Xarelto at 10mg  daily d/t obesity risk factor and patient reports fairly sedentary lifestyle.  He continues to work on weight loss, remains >300 lbs. - Advised patient to monitor for bleeding and safe precautions reviewed while on anticoagulation  - Refill Xarelto 10mg  daily  OSA: - Very severe sleep apnea, he is fairly non-compliant with CPAP - Download not avaialble, patient advised to bring in SD card for review - Reinforced importance of wearing CPAP every night for 4-6 hours or more - Needs CPAP supplies renewed, order sent to [facility]   Follow Up Instructions:   - 1 year follow-up with Dr. 2019  I discussed the assessment and treatment plan with the patient. The patient was provided an opportunity to ask questions and all were answered. The patient agreed with the plan and demonstrated an understanding of the instructions.   The patient was advised to call back or seek an in-person evaluation if the symptoms worsen or if the condition fails to improve as anticipated.  I provided 18 minutes of non-face-to-face time during this encounter.   , NP

## 2020-03-05 NOTE — Telephone Encounter (Signed)
Patient had televisit on 03/03/20 and xarelto was refilled for 1 year.  Nothing further needed at this time- will close encounter.

## 2020-03-26 ENCOUNTER — Other Ambulatory Visit: Payer: Self-pay | Admitting: Family

## 2020-03-26 DIAGNOSIS — E1122 Type 2 diabetes mellitus with diabetic chronic kidney disease: Secondary | ICD-10-CM

## 2020-04-10 DIAGNOSIS — G4733 Obstructive sleep apnea (adult) (pediatric): Secondary | ICD-10-CM | POA: Diagnosis not present

## 2020-05-01 ENCOUNTER — Other Ambulatory Visit: Payer: Self-pay | Admitting: Family

## 2020-05-01 DIAGNOSIS — E1122 Type 2 diabetes mellitus with diabetic chronic kidney disease: Secondary | ICD-10-CM

## 2020-05-17 ENCOUNTER — Other Ambulatory Visit: Payer: No Typology Code available for payment source

## 2020-05-17 ENCOUNTER — Encounter: Payer: Self-pay | Admitting: Family

## 2020-05-17 ENCOUNTER — Ambulatory Visit (INDEPENDENT_AMBULATORY_CARE_PROVIDER_SITE_OTHER): Payer: No Typology Code available for payment source | Admitting: Family

## 2020-05-17 ENCOUNTER — Other Ambulatory Visit: Payer: Self-pay

## 2020-05-17 VITALS — BP 132/70 | HR 94 | Temp 98.5°F | Ht 72.0 in | Wt 309.4 lb

## 2020-05-17 DIAGNOSIS — E1122 Type 2 diabetes mellitus with diabetic chronic kidney disease: Secondary | ICD-10-CM

## 2020-05-17 DIAGNOSIS — R0789 Other chest pain: Secondary | ICD-10-CM

## 2020-05-17 LAB — CBC WITH DIFFERENTIAL/PLATELET
Basophils Absolute: 0 10*3/uL (ref 0.0–0.1)
Basophils Relative: 0.4 % (ref 0.0–3.0)
Eosinophils Absolute: 0.1 10*3/uL (ref 0.0–0.7)
Eosinophils Relative: 2.4 % (ref 0.0–5.0)
HCT: 44.5 % (ref 39.0–52.0)
Hemoglobin: 14.8 g/dL (ref 13.0–17.0)
Lymphocytes Relative: 30.3 % (ref 12.0–46.0)
Lymphs Abs: 1.7 10*3/uL (ref 0.7–4.0)
MCHC: 33.2 g/dL (ref 30.0–36.0)
MCV: 79.1 fl (ref 78.0–100.0)
Monocytes Absolute: 0.4 10*3/uL (ref 0.1–1.0)
Monocytes Relative: 7.1 % (ref 3.0–12.0)
Neutro Abs: 3.4 10*3/uL (ref 1.4–7.7)
Neutrophils Relative %: 59.8 % (ref 43.0–77.0)
Platelets: 293 10*3/uL (ref 150.0–400.0)
RBC: 5.62 Mil/uL (ref 4.22–5.81)
RDW: 15.9 % — ABNORMAL HIGH (ref 11.5–15.5)
WBC: 5.6 10*3/uL (ref 4.0–10.5)

## 2020-05-17 LAB — COMPREHENSIVE METABOLIC PANEL
ALT: 28 U/L (ref 0–53)
AST: 17 U/L (ref 0–37)
Albumin: 4.2 g/dL (ref 3.5–5.2)
Alkaline Phosphatase: 71 U/L (ref 39–117)
BUN: 9 mg/dL (ref 6–23)
CO2: 27 mEq/L (ref 19–32)
Calcium: 9.3 mg/dL (ref 8.4–10.5)
Chloride: 100 mEq/L (ref 96–112)
Creatinine, Ser: 0.89 mg/dL (ref 0.40–1.50)
GFR: 104.71 mL/min (ref >60.00)
Glucose, Bld: 281 mg/dL — ABNORMAL HIGH (ref 70–99)
Potassium: 4.1 mEq/L (ref 3.5–5.1)
Sodium: 135 mEq/L (ref 135–145)
Total Bilirubin: 0.4 mg/dL (ref 0.2–1.2)
Total Protein: 7 g/dL (ref 6.0–8.3)

## 2020-05-17 MED ORDER — FAMOTIDINE 20 MG PO TABS
20.0000 mg | ORAL_TABLET | Freq: Two times a day (BID) | ORAL | 1 refills | Status: DC | PRN
Start: 2020-05-17 — End: 2020-07-09

## 2020-05-17 MED ORDER — PANTOPRAZOLE SODIUM 40 MG PO TBEC: 40.0000 mg | DELAYED_RELEASE_TABLET | Freq: Every day | ORAL | 3 refills | Status: DC

## 2020-05-17 NOTE — Addendum Note (Signed)
Addended by: Elita Boone E on: 05/17/2020 12:42 PM   Modules accepted: Orders

## 2020-05-17 NOTE — Progress Notes (Signed)
Jordan Cline is a 45 y.o. male with the following history as recorded in EpicCare:  Patient Active Problem List   Diagnosis Date Noted  . Morbid obesity ([place]) 12/06/2017  . Hyperlipidemia 09/26/2017  . Routine general medical examination at a health care facility 08/23/2017  . Erectile dysfunction associated with type 2 diabetes mellitus ([place]) 09/22/2014  . Type II diabetes mellitus ([place]) 05/13/2014  . OSA (obstructive sleep apnea) 05/13/2014  . Excessive daytime sleepiness 04/20/2014  . PFO (patent foramen ovale) 03/19/2014  . Borderline hypertension   . PE (pulmonary thromboembolism) ([place])     Current Outpatient Medications  Medication Sig Dispense Refill  . blood glucose meter kit and supplies KIT Dispense based on patient and insurance preference. Use up to four times daily as directed. (FOR ICD-9 250.00, 250.01). 1 each 0  . Dulaglutide (TRULICITY) 1.5 AG/5.3MI SOPN Inject 0.5 mLs (1.5 mg total) into the skin once a week. 4 pen 6  . glucose blood (ACCU-CHEK AVIVA PLUS) test strip USE AS DIRECTED 4 TIMES A DAY 100 each 2  . lisinopril (ZESTRIL) 10 MG tablet Take 1 tablet (10 mg total) by mouth daily. 90 tablet 3  . metFORMIN (GLUCOPHAGE) 500 MG tablet TAKE 2 TABLETS (1,000 MG TOTAL) BY MOUTH 2 (TWO) TIMES DAILY WITH A MEAL. 120 tablet 0  . pantoprazole (PROTONIX) 40 MG tablet Take 1 tablet (40 mg total) by mouth daily. 30 tablet 3  . rivaroxaban (XARELTO) 10 MG TABS tablet Take 1 tablet (10 mg total) by mouth daily. 30 tablet 11  . rosuvastatin (CRESTOR) 10 MG tablet Take 1 tablet (10 mg total) by mouth daily. 90 tablet 3  . famotidine (PEPCID) 20 MG tablet Take 1 tablet (20 mg total) by mouth 2 (two) times daily as needed for heartburn or indigestion. 60 tablet 1  . folic acid (FOLVITE) 1 MG tablet TAKE 1 TABLET BY MOUTH EVERY DAY (Patient not taking: Reported on 05/17/2020) 90 tablet 2   No current facility-administered medications for this visit.    Allergies: Patient has  no known allergies.  Past Medical History:  Diagnosis Date  . Acute deep vein thrombosis (DVT) of popliteal vein of right lower extremity ([place]) 03/2014  . Allergy   . Borderline hypertension   . Diabetes mellitus type 2 in obese ([place]) 03/2014   HgbA1c 13.0  . 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 Sleep apnea     No past surgical history on file.  Family History  Problem Relation Age of Onset  . Arrhythmia Mother   . Breast cancer Mother   . Hyperlipidemia Mother   . Diabetes Father   . Hypertension Father   . Cancer Maternal Uncle   . Cancer Maternal Grandfather   . Breast cancer Maternal Grandmother   . Stroke Paternal Grandmother     Social History   Tobacco Use  . Smoking status: Former Smoker    Packs/day: 0.00    Years: 12.00    Pack years: 0.00    Types: Cigarettes    Quit date: 02/13/2014    Years since quitting: 6.2  . Smokeless tobacco: Never Used  Substance Use Topics  . Alcohol use: Yes    Alcohol/week: 0.0 standard drinks    Comment: occasionallly    Subjective:   Follow up on Type 2 Diabetes- working on portion control/ pleased to see that has lost weight since last OV; taking Trulicity weekly as prescribed;  taking 1000 mg of Metformin daily; no concerns for low blood sugar;   Does have occasional GERD issues- typically takes OTC Pepcid with relief; approximately 1 month ago, GERD symptoms seemed worse than normal; felt like a pressure sitting on his chest- no chest pain on exertion; did find that Pepcid helped some with symptoms;    Objective:  Vitals:   05/17/20 0857  BP: 132/70  Pulse: 94  Temp: 98.5 F (36.9 C)  TempSrc: Oral  SpO2: 97%  Weight: (!) 309 lb 6.4 oz (140.3 kg)  Height: 6' (1.829 m)    General: Well developed, well nourished, in no acute distress  Skin : Warm and dry.  Head: Normocephalic and atraumatic  Eyes: Sclera and conjunctiva clear; pupils round and reactive to light;  extraocular movements intact  Lungs: Respirations unlabored; clear to auscultation bilaterally without wheeze, rales, rhonchi  CVS exam: normal rate and regular rhythm.  Neurologic: Alert and oriented; speech intact; face symmetrical; moves all extremities well; CNII-XII intact without focal deficit   Assessment:  1. Type 2 diabetes mellitus with chronic kidney disease, without long-term current use of insulin, unspecified CKD stage ([place])   2. Atypical chest pain     Plan:  1. Update labs today; to consider increasing dosage of Trulicity; followup to be determined; 2. EKG shows NSR; suspect GERD; trial of Protonix 40 mg daily; follow-up to be determined;  This visit occurred during the SARS-CoV-2 public health emergency.  Safety protocols were in place, including screening questions prior to the visit, additional usage of staff PPE, and extensive cleaning of exam room while observing appropriate contact time as indicated for disinfecting solutions.     No follow-ups on file.  Orders Placed This Encounter  Procedures  . CBC with Differential/Platelet    Standing Status:   Future    Number of Occurrences:   1    Standing Expiration Date:   05/17/2021  . Comp Met (CMET)    Standing Status:   Future    Number of Occurrences:   1    Standing Expiration Date:   05/17/2021  . Hemoglobin A1c    Standing Status:   Future    Number of Occurrences:   1    Standing Expiration Date:   05/17/2021  . EKG 12-Lead    Requested Prescriptions   Signed Prescriptions Disp Refills  . famotidine (PEPCID) 20 MG tablet 60 tablet 1    Sig: Take 1 tablet (20 mg total) by mouth 2 (two) times daily as needed for heartburn or indigestion.  . pantoprazole (PROTONIX) 40 MG tablet 30 tablet 3    Sig: Take 1 tablet (40 mg total) by mouth daily.

## 2020-05-18 ENCOUNTER — Encounter: Payer: Self-pay | Admitting: Family

## 2020-05-18 LAB — HEMOGLOBIN A1C
Hgb A1c MFr Bld: 13.5 % of total Hgb — ABNORMAL HIGH (ref <5.7)
Mean Plasma Glucose: 341 mg/dL
eAG (mmol/L): 18.9 mmol/L

## 2020-05-19 ENCOUNTER — Other Ambulatory Visit: Payer: Self-pay | Admitting: Family

## 2020-05-19 ENCOUNTER — Encounter: Payer: Self-pay | Admitting: Family

## 2020-05-19 DIAGNOSIS — E1122 Type 2 diabetes mellitus with diabetic chronic kidney disease: Secondary | ICD-10-CM

## 2020-05-19 MED ORDER — METFORMIN HCL 500 MG PO TABS
2.0000 | ORAL_TABLET | Freq: Two times a day (BID) | ORAL | 2 refills | Status: DC
Start: 2020-05-19 — End: 2020-08-25

## 2020-05-19 MED ORDER — TRULICITY 3 MG/0.5ML ~~LOC~~ SOAJ: SUBCUTANEOUS | 2 refills | Status: DC

## 2020-05-19 MED ORDER — DAPAGLIFLOZIN PROPANEDIOL 5 MG PO TABS: 5.0000 mg | ORAL_TABLET | Freq: Every day | ORAL | 2 refills | Status: DC

## 2020-05-19 NOTE — Telephone Encounter (Signed)
Pt has given [place] a call back and clarified some information for him. I have also informed him that the rxs have been sent to pharmacy today and he can pick meds up.   I have also given him the number to [facility] so he can make his 3 m f/u appt and he stated he will. Phone number to [facility] location is [phone].

## 2020-05-20 DIAGNOSIS — H5213 Myopia, bilateral: Secondary | ICD-10-CM | POA: Diagnosis not present

## 2020-07-09 ENCOUNTER — Other Ambulatory Visit: Payer: Self-pay | Admitting: Family

## 2020-08-05 DIAGNOSIS — Z20822 Contact with and (suspected) exposure to covid-19: Secondary | ICD-10-CM | POA: Diagnosis not present

## 2020-08-19 ENCOUNTER — Other Ambulatory Visit: Payer: Self-pay | Admitting: Family

## 2020-08-19 ENCOUNTER — Ambulatory Visit (INDEPENDENT_AMBULATORY_CARE_PROVIDER_SITE_OTHER): Payer: 59 | Admitting: Family

## 2020-08-19 ENCOUNTER — Other Ambulatory Visit: Payer: Self-pay

## 2020-08-19 ENCOUNTER — Encounter: Payer: Self-pay | Admitting: Family

## 2020-08-19 VITALS — BP 112/64 | HR 83 | Temp 98.6°F | Ht 72.0 in | Wt 308.8 lb

## 2020-08-19 DIAGNOSIS — E1122 Type 2 diabetes mellitus with diabetic chronic kidney disease: Secondary | ICD-10-CM

## 2020-08-19 LAB — POCT GLYCOSYLATED HEMOGLOBIN (HGB A1C): Hemoglobin A1C: 8.8 % — AB (ref 4.0–5.6)

## 2020-08-19 MED ORDER — TRULICITY 4.5 MG/0.5ML ~~LOC~~ SOAJ: 4.5000 mg | SUBCUTANEOUS | 1 refills | Status: DC

## 2020-08-19 NOTE — Progress Notes (Signed)
Jordan Cline is a 45 y.o. male with the following history as recorded in EpicCare:  Patient Active Problem List   Diagnosis Date Noted   Morbid obesity ([place]) 12/06/2017   Hyperlipidemia 09/26/2017   Routine general medical examination at a health care facility 08/23/2017   Erectile dysfunction associated with type 2 diabetes mellitus ([place]) 09/22/2014   Type II diabetes mellitus ([place]) 05/13/2014   OSA (obstructive sleep apnea) 05/13/2014   Excessive daytime sleepiness 04/20/2014   PFO (patent foramen ovale) 03/19/2014   Borderline hypertension    PE (pulmonary thromboembolism) ([facility])     Current Outpatient Medications  Medication Sig Dispense Refill   blood glucose meter kit and supplies KIT Dispense based on patient and insurance preference. Use up to four times daily as directed. (FOR ICD-9 250.00, 250.01). 1 each 0   dapagliflozin propanediol (FARXIGA) 5 MG TABS tablet Take 1 tablet (5 mg total) by mouth daily before breakfast. 30 tablet 2   Dulaglutide (TRULICITY) 4.5 QQ/2.2LN SOPN Inject 4.5 mg as directed once a week. 2 mL 1   famotidine (PEPCID) 20 MG tablet TAKE 1 TABLET (20 MG TOTAL) BY MOUTH 2 (TWO) TIMES DAILY AS NEEDED FOR HEARTBURN OR INDIGESTION. 180 tablet 1   glucose blood (ACCU-CHEK AVIVA PLUS) test strip USE AS DIRECTED 4 TIMES A DAY 100 each 2   lisinopril (ZESTRIL) 10 MG tablet Take 1 tablet (10 mg total) by mouth daily. 90 tablet 3   metFORMIN (GLUCOPHAGE) 500 MG tablet Take 2 tablets (1,000 mg total) by mouth 2 (two) times daily with a meal. 120 tablet 2   pantoprazole (PROTONIX) 40 MG tablet TAKE 1 TABLET BY MOUTH EVERY DAY 90 tablet 2   rivaroxaban (XARELTO) 10 MG TABS tablet Take 1 tablet (10 mg total) by mouth daily. 30 tablet 11   rosuvastatin (CRESTOR) 10 MG tablet Take 1 tablet (10 mg total) by mouth daily. 90 tablet 3   folic acid (FOLVITE) 1 MG tablet TAKE 1 TABLET BY MOUTH EVERY DAY (Patient not taking: Reported on 08/19/2020) 90 tablet 2   No current  facility-administered medications for this visit.    Allergies: Patient has no known allergies.  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  Family History  Problem Relation Age of Onset   Arrhythmia Mother    Breast cancer Mother    Hyperlipidemia Mother    Diabetes Father    Hypertension Father    Cancer Maternal Uncle    Cancer Maternal Grandfather    Breast cancer Maternal Grandmother    Stroke Paternal Grandmother     Social History   Tobacco Use   Smoking status: Former    Packs/day: 0.00    Years: 12.00    Pack years: 0.00    Types: Cigarettes    Quit date: 02/13/2014    Years since quitting: 6.5   Smokeless tobacco: Never  Substance Use Topics   Alcohol use: Yes    Alcohol/week: 0.0 standard drinks    Comment: occasionallly    Subjective:  3 month follow up on Type 2 Diabetes;  Has been  working on diet/ exercise and trying to take medication regularly; taking Metformin at night and trying to be more consistent with lunch-time Metformin; may take lunch time dose 70% of the time;  No concerns for low blood sugar; Denies any chest pain, shortness of breath, blurred vision or headache.  Hgba1c today at 8.8- down from 13.5;    Objective:  Vitals:   08/19/20 0921  BP: 112/64  Pulse: 83  Temp: 98.6 F (37 C)  TempSrc: Oral  SpO2: 98%  Weight: (!) 308 lb 12.8 oz (140.1 kg)  Height: 6' (1.829 m)    General: Well developed, well nourished, in no acute distress  Skin : Warm and dry.  Head: Normocephalic and atraumatic  Eyes: Sclera and conjunctiva clear; pupils round and reactive to light; extraocular movements intact  Ears: External  normal; canals clear; tympanic membranes normal  Oropharynx: Pink, supple. No suspicious lesions  Neck: Supple without thyromegaly, adenopathy  Lungs: Respirations unlabored; clear to auscultation bilaterally without wheeze, rales, rhonchi  CVS exam: normal rate and regular rhythm.  Neurologic: Alert and oriented; speech intact; face symmetrical; moves all extremities well; CNII-XII intact without focal deficit   Assessment:  1. Type 2 diabetes mellitus with chronic kidney disease, without long-term current use of insulin, unspecified CKD stage ([place])     Plan:  Control improving; goal is to get Hgba1c below 8; encouraged to take Metformin twice a day as directed; continue [place] daily; increase Trulicity to 4.5 weekly; patient does not want to take insulin unless absolutely necessary; follow up in 3 months, sooner prn.  This visit occurred during the SARS-CoV-2 public health emergency.  Safety protocols were in place, including screening questions prior to the visit, additional usage of staff PPE, and extensive cleaning of exam room while observing appropriate contact time as indicated for disinfecting solutions.    Return in about 3 months (around 11/19/2020).  Orders Placed This Encounter  Procedures   POCT HgB A1C    Requested Prescriptions   Signed Prescriptions Disp Refills   Dulaglutide (TRULICITY) 4.5 GI/7.1LL SOPN 2 mL 1    Sig: Inject 4.5 mg as directed once a week.

## 2020-08-20 ENCOUNTER — Other Ambulatory Visit: Payer: Self-pay | Admitting: Family

## 2020-08-25 ENCOUNTER — Other Ambulatory Visit: Payer: Self-pay | Admitting: Family

## 2020-08-25 DIAGNOSIS — E1122 Type 2 diabetes mellitus with diabetic chronic kidney disease: Secondary | ICD-10-CM

## 2020-09-12 ENCOUNTER — Other Ambulatory Visit: Payer: Self-pay | Admitting: Family

## 2020-09-12 DIAGNOSIS — E118 Type 2 diabetes mellitus with unspecified complications: Secondary | ICD-10-CM

## 2020-09-12 DIAGNOSIS — E785 Hyperlipidemia, unspecified: Secondary | ICD-10-CM

## 2020-09-12 DIAGNOSIS — I10 Essential (primary) hypertension: Secondary | ICD-10-CM

## 2020-10-28 ENCOUNTER — Other Ambulatory Visit: Payer: Self-pay | Admitting: Family

## 2020-11-18 ENCOUNTER — Other Ambulatory Visit: Payer: Self-pay | Admitting: Family

## 2020-11-18 DIAGNOSIS — E1122 Type 2 diabetes mellitus with diabetic chronic kidney disease: Secondary | ICD-10-CM

## 2020-11-27 ENCOUNTER — Other Ambulatory Visit: Payer: Self-pay | Admitting: Family

## 2021-01-25 ENCOUNTER — Other Ambulatory Visit: Payer: Self-pay

## 2021-01-25 ENCOUNTER — Ambulatory Visit (HOSPITAL_BASED_OUTPATIENT_CLINIC_OR_DEPARTMENT_OTHER)
Admission: RE | Admit: 2021-01-25 | Discharge: 2021-01-25 | Disposition: A | Discharge status: Home / Self Care | Payer: 59 | Source: Ambulatory Visit | Attending: Family | Admitting: Family

## 2021-01-25 ENCOUNTER — Ambulatory Visit (INDEPENDENT_AMBULATORY_CARE_PROVIDER_SITE_OTHER): Payer: 59 | Admitting: Family

## 2021-01-25 VITALS — BP 110/70 | HR 101 | Temp 98.4°F | Ht 72.0 in | Wt 311.2 lb

## 2021-01-25 DIAGNOSIS — R0789 Other chest pain: Secondary | ICD-10-CM | POA: Insufficient documentation

## 2021-01-25 DIAGNOSIS — E1122 Type 2 diabetes mellitus with diabetic chronic kidney disease: Secondary | ICD-10-CM

## 2021-01-25 LAB — POCT GLYCOSYLATED HEMOGLOBIN (HGB A1C): Hemoglobin A1C: 9 % — AB (ref 4.0–5.6)

## 2021-01-25 LAB — GLUCOSE, POCT (MANUAL RESULT ENTRY): POC Glucose: 200 mg/dl — AB (ref 70–99)

## 2021-01-25 MED ORDER — TRULICITY 4.5 MG/0.5ML ~~LOC~~ SOAJ: 4.5000 mg | SUBCUTANEOUS | 3 refills | Status: DC

## 2021-01-25 MED ORDER — PANTOPRAZOLE SODIUM 40 MG PO TBEC: 40.0000 mg | DELAYED_RELEASE_TABLET | Freq: Two times a day (BID) | ORAL | 2 refills | Status: DC

## 2021-01-25 MED ORDER — ALBUTEROL SULFATE HFA 108 (90 BASE) MCG/ACT IN AERS: 2.0000 | INHALATION_SPRAY | Freq: Four times a day (QID) | RESPIRATORY_TRACT | 2 refills | Status: DC | PRN

## 2021-01-25 MED ORDER — DAPAGLIFLOZIN PROPANEDIOL 10 MG PO TABS: 10.0000 mg | ORAL_TABLET | Freq: Every day | ORAL | 1 refills | Status: DC

## 2021-01-25 MED ORDER — METFORMIN HCL ER 500 MG PO TB24: 2000.0000 mg | ORAL_TABLET | Freq: Every day | ORAL | 0 refills | Status: DC

## 2021-01-25 NOTE — Progress Notes (Signed)
Jordan Cline is a 45 y.o. male with the following history as recorded in EpicCare:  Patient Active Problem List   Diagnosis Date Noted   Morbid obesity ([place]) 12/06/2017   Hyperlipidemia 09/26/2017   Routine general medical examination at a health care facility 08/23/2017   Erectile dysfunction associated with type 2 diabetes mellitus ([place]) 09/22/2014   Type II diabetes mellitus ([place]) 05/13/2014   OSA (obstructive sleep apnea) 05/13/2014   Excessive daytime sleepiness 04/20/2014   PFO (patent foramen ovale) 03/19/2014   Borderline hypertension    PE (pulmonary thromboembolism) ([facility])     Current Outpatient Medications  Medication Sig Dispense Refill   albuterol (VENTOLIN HFA) 108 (90 Base) MCG/ACT inhaler Inhale 2 puffs into the lungs every 6 (six) hours as needed for wheezing or shortness of breath. 8 g 2   blood glucose meter kit and supplies KIT Dispense based on patient and insurance preference. Use up to four times daily as directed. (FOR ICD-9 250.00, 250.01). 1 each 0   famotidine (PEPCID) 20 MG tablet TAKE 1 TABLET (20 MG TOTAL) BY MOUTH 2 (TWO) TIMES DAILY AS NEEDED FOR HEARTBURN OR INDIGESTION. 180 tablet 1   glucose blood (ACCU-CHEK AVIVA PLUS) test strip USE AS DIRECTED 4 TIMES A DAY 100 each 2   lisinopril (ZESTRIL) 10 MG tablet TAKE 1 TABLET BY MOUTH EVERY DAY 90 tablet 3   metFORMIN (GLUCOPHAGE-XR) 500 MG 24 hr tablet Take 4 tablets (2,000 mg total) by mouth daily with breakfast. 120 tablet 0   rivaroxaban (XARELTO) 10 MG TABS tablet Take 1 tablet (10 mg total) by mouth daily. 30 tablet 11   rosuvastatin (CRESTOR) 10 MG tablet TAKE 1 TABLET BY MOUTH EVERY DAY 90 tablet 3   dapagliflozin propanediol (FARXIGA) 10 MG TABS tablet Take 1 tablet (10 mg total) by mouth daily. 90 tablet 1   Dulaglutide (TRULICITY) 4.5 EK/8.0KL SOPN Inject 4.5 mg as directed once a week. 2 mL 3   folic acid (FOLVITE) 1 MG tablet TAKE 1 TABLET BY MOUTH EVERY DAY (Patient not taking: Reported on  08/19/2020) 90 tablet 2   pantoprazole (PROTONIX) 40 MG tablet Take 1 tablet (40 mg total) by mouth 2 (two) times daily. 90 tablet 2   No current facility-administered medications for this visit.    Allergies: Patient has no known allergies.  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  Family History  Problem Relation Age of Onset   Arrhythmia Mother    Breast cancer Mother    Hyperlipidemia Mother    Diabetes Father    Hypertension Father    Cancer Maternal Uncle    Cancer Maternal Grandfather    Breast cancer Maternal Grandmother    Stroke Paternal Grandmother     Social History   Tobacco Use   Smoking status: Former    Packs/day: 0.00    Years: 12.00    Pack years: 0.00    Types: Cigarettes    Quit date: 02/13/2014    Years since quitting: 6.9   Smokeless tobacco: Never  Substance Use Topics   Alcohol use: Yes  Alcohol/week: 0.0 standard drinks    Comment: occasionallly    Subjective:  5 month follow up on Type 2 Diabetes; Hgba1c is at 9 today; non fasting glucose POC at 200; Admits he has not been taking medication as prescribed; has been taking 3 mg of Trulicity as opposed to the 4.5 prescribed; taking 2 Metformin per day in the evening as opposed to 2 po bid; is taking [place] daily; not able to exercise as regularly due to cold weather;   Also mentions sensation of discomfort in his chest again today; not on exertion; similar to what was discussed earlier this year; EKG showed NSR in 05/2020; thought to be GERD related and given Protonix; did feel this helped with symptoms but not currently on this medication; has quit smoking in the past 2  months;     Objective:  Vitals:   01/25/21 1527  BP: 110/70  Pulse: (!) 101  Temp: 98.4 F (36.9 C)  TempSrc: Oral  SpO2: 96%  Weight: (!) 311 lb 3.2 oz (141.2 kg)  Height: 6' (1.829 m)    General: Well developed, well nourished, in no acute distress  Skin : Warm and dry.  Head: Normocephalic and atraumatic  Eyes: Sclera and conjunctiva clear; pupils round and reactive to light; extraocular movements intact  Ears: External normal; canals clear; tympanic membranes normal  Oropharynx: Pink, supple. No suspicious lesions  Neck: Supple without thyromegaly, adenopathy  Lungs: Respirations unlabored; clear to auscultation bilaterally without wheeze, rales, rhonchi  CVS exam: normal rate and regular rhythm.  Neurologic: Alert and oriented; speech intact; face symmetrical; moves all extremities well; CNII-XII intact without focal deficit   Assessment:  1. Type 2 diabetes mellitus with chronic kidney disease, without long-term current use of insulin, unspecified CKD stage ([place])   2. Atypical chest pain     Plan:  Uncontrolled- per patient there have been some medication issues in the past few months; refill updated on Trulicity 4.5; increase Farxiga to 10 mg; try changing to XR formulation of Metformin- try taking 3/ day x 1 week in the evening and increase to 4 po qd as long as no GI side effects; follow up in 3 months; will need full labs at that visit; ? GERD- refill Protonix 40 mg bid and use as needed;  update CXR today; will need to consider cardiology evaluation due to underlying health issues;   This visit occurred during the SARS-CoV-2 public health emergency.  Safety protocols were in place, including screening questions prior to the visit, additional usage of staff PPE, and extensive cleaning of exam room while observing appropriate contact time as indicated for disinfecting solutions.    Return in about 3 months (around 04/25/2021).  Orders Placed This Encounter  Procedures    DG Chest 2 View    Standing Status:   Future    Number of Occurrences:   1    Standing Expiration Date:   01/25/2022    Order Specific Question:   Reason for Exam (SYMPTOM  OR DIAGNOSIS REQUIRED)    Answer:   atypical chest pain    Order Specific Question:   Preferred imaging location?    Answer:   [facility]   POCT HgB A1C   POCT glucose (manual entry)    Requested Prescriptions   Signed Prescriptions Disp Refills   metFORMIN (GLUCOPHAGE-XR) 500 MG 24 hr tablet 120 tablet 0    Sig: Take 4 tablets (2,000 mg total) by mouth daily with breakfast.  dapagliflozin propanediol (FARXIGA) 10 MG TABS tablet 90 tablet 1    Sig: Take 1 tablet (10 mg total) by mouth daily.   Dulaglutide (TRULICITY) 4.5 EE/0.3VV SOPN 2 mL 3    Sig: Inject 4.5 mg as directed once a week.   pantoprazole (PROTONIX) 40 MG tablet 90 tablet 2    Sig: Take 1 tablet (40 mg total) by mouth 2 (two) times daily.   albuterol (VENTOLIN HFA) 108 (90 Base) MCG/ACT inhaler 8 g 2    Sig: Inhale 2 puffs into the lungs every 6 (six) hours as needed for wheezing or shortness of breath.

## 2021-01-27 ENCOUNTER — Other Ambulatory Visit: Payer: Self-pay | Admitting: Family

## 2021-01-27 DIAGNOSIS — R9389 Abnormal findings on diagnostic imaging of other specified body structures: Secondary | ICD-10-CM

## 2021-02-11 ENCOUNTER — Ambulatory Visit
Admission: RE | Admit: 2021-02-11 | Discharge: 2021-02-11 | Disposition: A | Discharge status: Home / Self Care | Payer: 59 | Source: Ambulatory Visit | Attending: Family | Admitting: Family

## 2021-02-11 DIAGNOSIS — R918 Other nonspecific abnormal finding of lung field: Secondary | ICD-10-CM | POA: Diagnosis not present

## 2021-02-11 DIAGNOSIS — R9389 Abnormal findings on diagnostic imaging of other specified body structures: Secondary | ICD-10-CM

## 2021-02-11 DIAGNOSIS — J984 Other disorders of lung: Secondary | ICD-10-CM | POA: Diagnosis not present

## 2021-02-25 ENCOUNTER — Other Ambulatory Visit: Payer: Self-pay | Admitting: Family

## 2021-02-25 DIAGNOSIS — E1122 Type 2 diabetes mellitus with diabetic chronic kidney disease: Secondary | ICD-10-CM

## 2021-02-25 MED ORDER — METFORMIN HCL ER 500 MG PO TB24: 2000.0000 mg | ORAL_TABLET | Freq: Every day | ORAL | 0 refills | Status: DC

## 2021-03-24 ENCOUNTER — Other Ambulatory Visit: Payer: Self-pay | Admitting: Family

## 2021-03-28 ENCOUNTER — Other Ambulatory Visit: Payer: Self-pay | Admitting: Primary Care

## 2021-05-22 ENCOUNTER — Other Ambulatory Visit: Payer: Self-pay | Admitting: Family

## 2021-05-23 NOTE — Telephone Encounter (Signed)
LVM for Pt called back need appt for Refill ?

## 2021-05-28 ENCOUNTER — Other Ambulatory Visit: Payer: Self-pay | Admitting: Primary Care

## 2021-05-30 NOTE — Telephone Encounter (Signed)
Please call this patient for a follow up visit to get further refills of Xarelto.  ?

## 2021-05-30 NOTE — Telephone Encounter (Signed)
LVMTCB to set up rov//mm ?

## 2021-06-01 ENCOUNTER — Telehealth: Payer: Self-pay | Admitting: Primary Care

## 2021-06-01 MED ORDER — RIVAROXABAN 10 MG PO TABS: 10.0000 mg | ORAL_TABLET | Freq: Every day | ORAL | 0 refills | Status: DC

## 2021-06-01 NOTE — Telephone Encounter (Signed)
Called and spoke with pt to verify which pharmacy he wanted to have the Xarelto sent to and have sent Rx for pt. Stated to pt to make sure he kept his upcoming appt. Nothing further needed. ?

## 2021-06-07 ENCOUNTER — Telehealth: Payer: Self-pay | Admitting: Nurse Practitioner

## 2021-06-07 ENCOUNTER — Encounter: Payer: Self-pay | Admitting: Nurse Practitioner

## 2021-06-07 ENCOUNTER — Ambulatory Visit (INDEPENDENT_AMBULATORY_CARE_PROVIDER_SITE_OTHER): Payer: 59 | Admitting: Nurse Practitioner

## 2021-06-07 DIAGNOSIS — G4733 Obstructive sleep apnea (adult) (pediatric): Secondary | ICD-10-CM | POA: Diagnosis not present

## 2021-06-07 DIAGNOSIS — I2699 Other pulmonary embolism without acute cor pulmonale: Secondary | ICD-10-CM | POA: Diagnosis not present

## 2021-06-07 MED ORDER — RIVAROXABAN 10 MG PO TABS: 10.0000 mg | ORAL_TABLET | Freq: Every day | ORAL | 11 refills | Status: DC

## 2021-06-07 NOTE — Assessment & Plan Note (Addendum)
Submassive PE with RV strain, right DVT- requiring catheter directed thrombolysis in 2016. Treatment options were discussed with patient by Dr. Vassie Loll back in 2018 and it was felt that he should continue Xarelto at 10mg  daily d/t obesity risk factor and patient reports fairly sedentary lifestyle. He continues to work on weight loss, remains >300 lbs. He feels that there may be a familial component; although his hypercoag workup was neg. Advised patient to monitor for bleeding and safe precautions reviewed while on anticoagulation  ? ?Patient Instructions  ?Continue Xarelto 10 mg daily  ? ?Start to use CPAP every night, minimum of 4-6 hours a night.  ?Change equipment every 30 days or as directed by DME. Wash your tubing with warm soap and water daily, hang to dry. Wash humidifier portion weekly.  ?Be aware of reduced alertness and do not drive or operate heavy machinery if experiencing this or drowsiness.  ?Healthy weight management discussed.  ?Avoid or decrease alcohol consumption and medications that make you more sleepy, if possible. ?Notify if persistent daytime sleepiness occurs even with consistent use of CPAP. ? ?Follow up in 3 months with Dr. . If symptoms do not improve or worsen, please contact office for sooner follow up or seek emergency care. ? ? ?

## 2021-06-07 NOTE — Assessment & Plan Note (Addendum)
Very severe obstructive sleep apnea.  Noncompliant with CPAP therapy.  We had a lengthy discussion about the importance of CPAP therapy and how severe his sleep apnea is.  Discussed the risk that this poses to his health including cardiac arrhthymias, pulm HTN, DM, stroke and increases their risk for daytime accidents. He verbalized understanding and stated that he would try to begin wearing it more consistently. No download available. Will need to bring SD card to follow up. ?

## 2021-06-07 NOTE — Patient Instructions (Addendum)
Continue Xarelto 10 mg daily  ? ?Start to use CPAP every night, minimum of 4-6 hours a night.  ?Change equipment every 30 days or as directed by DME. Wash your tubing with warm soap and water daily, hang to dry. Wash humidifier portion weekly.  ?Be aware of reduced alertness and do not drive or operate heavy machinery if experiencing this or drowsiness.  ?Healthy weight management discussed.  ?Avoid or decrease alcohol consumption and medications that make you more sleepy, if possible. ?Notify if persistent daytime sleepiness occurs even with consistent use of CPAP. ? ?Follow up in 3 months with Dr. Vassie Loll. If symptoms do not improve or worsen, please contact office for sooner follow up or seek emergency care. ? ?

## 2021-06-07 NOTE — Progress Notes (Signed)
? ?_0  ID: Jordan Cline, male    DOB: September 29, 1975, 46 y.o.   MRN: [id number] ? ?Chief Complaint  ?Patient presents with  ? Follow-up  ?  He is here to get his Xarelto filled.   ? ? ?Referring provider: ?Marrian Salvage,* ? ?HPI: ?46 year old male, former smoker (12 pack years) followed for OSA and submassive PE (unprovoked) with RV strain, right DVT maintained on Xarelto. He is a patient of Dr. Bari Mantis and last seen 03/03/2020 by Thomasenia Bottoms. Past medical history significant for PFO, DM II, HTN, HLD.  ? ?TEST/EVENTS:  ?03/2014: Mild LVH, G1 DD.  RV moderately dilated with decreased systolic function.  Bubble study was difficult.  Possibly positive for PFO ?03/2014 CTA chest: Saddle embolus with large emboli extending into both lower lobes.  Emboli also noted in both upper lobes.  Possible early lingular pulmonary infarct ?03/2014: Hypercoag panel negative ?2016 venous Doppler: Negative DVT ?04/2014 HST: AHI 85 ? ?03/03/2020: OV with Volanda Napoleon NP. Critical illness 2016 with submassive PE with right heart strain and right DVT, required catheter direct thrombolysis. Hematology recommended 2 years of therapy; shared decision to continue daily with Dr. Elsworth Soho prior to this visit given morbid obesity and risks this poses. Refilled Xarelto. Wearing CPAP maybe 10 times past month. Does feel better when he wears it but forgets to put it on. Fairly non-compliant with CPAP; reinforced importance of wearing. ? ?06/07/2021: Today - follow up ?Patient presents today for overdue follow-up to get Xarelto refilled.  Reports that he has been doing well with no recent bleeding or excessive bruising.  He would prefer to remain on Xarelto for the foreseeable future.  He is concerned because his brother recently was diagnosed with 2 pulmonary embolisms, 1 in January and then 1 to 3 months later after coming off anticoagulation therapy.  Also reports that this is happened to his aunt as well.  His previous hypercoagulation panel was  negative. ? ?As far as his CPAP therapy goes, he reports that he wears it some nights when he feels like he needs to get a good nights rest.  He does not particularly have any issues with machine or mask fit; just feels like it is a hassle to wear.  Does continue to have some daytime fatigue symptoms.  Denies morning headaches or drowsy driving.  Breathing is overall stable. ? ?No Known Allergies ? ?Immunization History  ?Administered Date(s) Administered  ? PFIZER(Purple Top)SARS-COV-2 Vaccination 04/26/2019, 05/17/2019  ? Tdap 08/23/2017  ? ? ?Past Medical History:  ?Diagnosis Date  ? Acute deep vein thrombosis (DVT) of popliteal vein of right lower extremity ([place]) 03/2014  ? Allergy   ? Borderline hypertension   ? Diabetes mellitus type 2 in obese ([place]) 03/2014  ? HgbA1c 13.0  ? 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 s/p TPA  ? Sleep apnea   ? ? ?Tobacco History: ?Social History  ? ?Tobacco Use  ?Smoking Status Former  ? Packs/day: 0.00  ? Years: 12.00  ? Pack years: 0.00  ? Types: Cigarettes  ? Quit date: 02/13/2014  ? Years since quitting: 7.3  ?Smokeless Tobacco Never  ? ?Counseling given: Not Answered ? ? ?Outpatient Medications Prior to Visit  ?Medication Sig Dispense Refill  ? albuterol (VENTOLIN HFA) 108 (90 Base) MCG/ACT inhaler Inhale 2 puffs into the lungs every 6 (six) hours as needed for wheezing or shortness of breath. 8 g 2  ? blood glucose meter kit and  supplies KIT Dispense based on patient and insurance preference. Use up to four times daily as directed. (FOR ICD-9 250.00, 250.01). 1 each 0  ? dapagliflozin propanediol (FARXIGA) 10 MG TABS tablet Take 1 tablet (10 mg total) by mouth daily. 90 tablet 1  ? Dulaglutide (TRULICITY) 4.5 WN/4.6EV SOPN Inject 4.5 mg as directed once a week. 2 mL 3  ? famotidine (PEPCID) 20 MG tablet TAKE 1 TABLET (20 MG TOTAL) BY MOUTH 2 (TWO) TIMES DAILY AS NEEDED FOR HEARTBURN OR INDIGESTION. 180 tablet 1  ? folic acid (FOLVITE) 1  MG tablet TAKE 1 TABLET BY MOUTH EVERY DAY 90 tablet 2  ? glucose blood (ACCU-CHEK AVIVA PLUS) test strip USE AS DIRECTED 4 TIMES A DAY 100 each 2  ? lisinopril (ZESTRIL) 10 MG tablet TAKE 1 TABLET BY MOUTH EVERY DAY 90 tablet 3  ? metFORMIN (GLUCOPHAGE-XR) 500 MG 24 hr tablet TAKE 4 TABLETS BY MOUTH DAILY WITH BREAKFAST 120 tablet 0  ? pantoprazole (PROTONIX) 40 MG tablet Take 1 tablet (40 mg total) by mouth 2 (two) times daily. 90 tablet 2  ? rivaroxaban (XARELTO) 10 MG TABS tablet Take 1 tablet (10 mg total) by mouth daily. 30 tablet 0  ? rosuvastatin (CRESTOR) 10 MG tablet TAKE 1 TABLET BY MOUTH EVERY DAY 90 tablet 3  ? ?No facility-administered medications prior to visit.  ? ? ? ?Review of Systems:  ? ?Constitutional: No weight loss or gain, night sweats, fevers, chills +fatigue ?HEENT: No headaches, difficulty swallowing, tooth/dental problems, or sore throat. No sneezing, itching, ear ache, nasal congestion, or post nasal drip ?CV:  No chest pain, orthopnea, PND, swelling in lower extremities, anasarca, dizziness, palpitations, syncope ?Resp: No shortness of breath with exertion or at rest. No excess mucus or change in color of mucus. No productive or non-productive. No hemoptysis. No wheezing.  No chest wall deformity ?GI:  No heartburn, indigestion, abdominal pain, nausea, vomiting, diarrhea, change in bowel habits, loss of appetite, bloody stools.  ?GU: No dysuria, change in color of urine, urgency or frequency.  No flank pain, no hematuria  ?Skin: No rash, lesions, ulcerations ?MSK:  No joint pain or swelling.  No decreased range of motion.  No back pain. ?Neuro: No dizziness or lightheadedness.  ?Psych: No depression or anxiety. Mood stable.  ? ? ? ?Physical Exam: ? ?BP 124/80 (BP Location: Left Arm, Cuff Size: Large)   Pulse 86   Temp 98.3 ?F (36.8 ?C) (Oral)   Ht 6' (1.829 m)   Wt (!) 308 lb (139.7 kg)   SpO2 100%   BMI 41.77 kg/m?  ? ?GEN: Pleasant, interactive, well-appearing; morbidly obese;  in no acute distress. ?HEENT:  Normocephalic and atraumatic. PERRLA. Sclera white. Nasal turbinates pink, moist and patent bilaterally. No rhinorrhea present. Oropharynx pink and moist, without exudate or edema. No lesions, ulcerations, or postnasal drip.  ?NECK:  Supple w/ fair ROM. No JVD present. Normal carotid impulses w/o bruits. Thyroid symmetrical with no goiter or nodules palpated. No lymphadenopathy.   ?CV: RRR, no m/r/g, no peripheral edema. Pulses intact, +2 bilaterally. No cyanosis, pallor or clubbing. ?PULMONARY:  Unlabored, regular breathing. Clear bilaterally A&P w/o wheezes/rales/rhonchi. No accessory muscle use. No dullness to percussion. ?GI: BS present and normoactive. Soft, non-tender to palpation. No organomegaly or masses detected. No CVA tenderness. ?MSK: No erythema, warmth or tenderness. Cap refil <2 sec all extrem. No deformities or joint swelling noted.  ?Neuro: A/Ox3. No focal deficits noted.   ?Skin: Warm, no lesions or  rashe ?Psych: Normal affect and behavior. Judgement and thought content appropriate.  ? ? ? ?Lab Results: ? ?CBC ?   ?Component Value Date/Time  ? WBC 5.6 05/17/2020 0943  ? RBC 5.62 05/17/2020 0943  ? HGB 14.8 05/17/2020 0943  ? HGB 15.2 01/08/2015 1312  ? HCT 44.5 05/17/2020 0943  ? HCT 43.7 01/08/2015 1312  ? PLT 293.0 05/17/2020 0943  ? PLT 278 01/08/2015 1312  ? MCV 79.1 05/17/2020 0943  ? MCV 77 (L) 01/08/2015 1312  ? MCH 26.2 (L) 09/10/2019 0957  ? MCHC 33.2 05/17/2020 0943  ? RDW 15.9 (H) 05/17/2020 0943  ? RDW 15.0 01/08/2015 1312  ? LYMPHSABS 1.7 05/17/2020 0943  ? LYMPHSABS 1.5 01/08/2015 1312  ? MONOABS 0.4 05/17/2020 0943  ? EOSABS 0.1 05/17/2020 0943  ? EOSABS 0.1 01/08/2015 1312  ? BASOSABS 0.0 05/17/2020 0943  ? BASOSABS 0.0 01/08/2015 1312  ? ? ?BMET ?   ?Component Value Date/Time  ? NA 135 05/17/2020 0943  ? NA 139 01/08/2015 1311  ? K 4.1 05/17/2020 0943  ? K 4.3 01/08/2015 1311  ? CL 100 05/17/2020 0943  ? CL 93 (L) 01/08/2015 1311  ? CO2 27 05/17/2020  0943  ? CO2 28 01/08/2015 1311  ? GLUCOSE 281 (H) 05/17/2020 0943  ? GLUCOSE 557 (HH) 01/08/2015 1311  ? BUN 9 05/17/2020 0943  ? BUN 11 01/08/2015 1311  ? CREATININE 0.89 05/17/2020 0943  ? CREATININE 0.97 07/28

## 2021-06-08 NOTE — Telephone Encounter (Signed)
Called and spoke with Aurther Loft with [employer], she advised that [facility] said there was no data since 2016, she was not sure if he was not wearing his CPAP or something was going on with the transmission.  She did a reset and that did not change anything.  She said our office is associated with his account already.  I advised her I would contact the patient to get more information. ? ?I called and spoke with the patient, he states he has an older machine and has an SD card in his machine.  Last night was the first night he had warn his CPAP.  He says this was discussed during his visit yesterday.  Advised to bring his SD card when he comes to his next visit. ?

## 2021-06-09 ENCOUNTER — Ambulatory Visit (INDEPENDENT_AMBULATORY_CARE_PROVIDER_SITE_OTHER): Payer: 59 | Admitting: Family

## 2021-06-09 VITALS — BP 104/70 | HR 89 | Temp 98.7°F | Ht 72.0 in | Wt 310.0 lb

## 2021-06-09 DIAGNOSIS — Z1211 Encounter for screening for malignant neoplasm of colon: Secondary | ICD-10-CM | POA: Diagnosis not present

## 2021-06-09 DIAGNOSIS — E119 Type 2 diabetes mellitus without complications: Secondary | ICD-10-CM | POA: Diagnosis not present

## 2021-06-09 DIAGNOSIS — Z125 Encounter for screening for malignant neoplasm of prostate: Secondary | ICD-10-CM

## 2021-06-09 DIAGNOSIS — I2602 Saddle embolus of pulmonary artery with acute cor pulmonale: Secondary | ICD-10-CM

## 2021-06-09 DIAGNOSIS — E1122 Type 2 diabetes mellitus with diabetic chronic kidney disease: Secondary | ICD-10-CM

## 2021-06-09 LAB — CBC WITH DIFFERENTIAL/PLATELET
Basophils Absolute: 0 10*3/uL (ref 0.0–0.1)
Basophils Relative: 0.4 % (ref 0.0–3.0)
Eosinophils Absolute: 0.1 10*3/uL (ref 0.0–0.7)
Eosinophils Relative: 2 % (ref 0.0–5.0)
HCT: 47.2 % (ref 39.0–52.0)
Hemoglobin: 15.4 g/dL (ref 13.0–17.0)
Lymphocytes Relative: 29.5 % (ref 12.0–46.0)
Lymphs Abs: 1.6 10*3/uL (ref 0.7–4.0)
MCHC: 32.6 g/dL (ref 30.0–36.0)
MCV: 79.7 fl (ref 78.0–100.0)
Monocytes Absolute: 0.3 10*3/uL (ref 0.1–1.0)
Monocytes Relative: 6.1 % (ref 3.0–12.0)
Neutro Abs: 3.5 10*3/uL (ref 1.4–7.7)
Neutrophils Relative %: 62 % (ref 43.0–77.0)
Platelets: 315 10*3/uL (ref 150.0–400.0)
RBC: 5.92 Mil/uL — ABNORMAL HIGH (ref 4.22–5.81)
RDW: 17.5 % — ABNORMAL HIGH (ref 11.5–15.5)
WBC: 5.6 10*3/uL (ref 4.0–10.5)

## 2021-06-09 LAB — COMPREHENSIVE METABOLIC PANEL
ALT: 36 U/L (ref 0–53)
AST: 24 U/L (ref 0–37)
Albumin: 4.5 g/dL (ref 3.5–5.2)
Alkaline Phosphatase: 60 U/L (ref 39–117)
BUN: 10 mg/dL (ref 6–23)
CO2: 28 mEq/L (ref 19–32)
Calcium: 9.6 mg/dL (ref 8.4–10.5)
Chloride: 101 mEq/L (ref 96–112)
Creatinine, Ser: 0.99 mg/dL (ref 0.40–1.50)
GFR: 92.38 mL/min (ref >60.00)
Glucose, Bld: 141 mg/dL — ABNORMAL HIGH (ref 70–99)
Potassium: 4.5 mEq/L (ref 3.5–5.1)
Sodium: 136 mEq/L (ref 135–145)
Total Bilirubin: 0.4 mg/dL (ref 0.2–1.2)
Total Protein: 7.2 g/dL (ref 6.0–8.3)

## 2021-06-09 LAB — LIPID PANEL
Cholesterol: 120 mg/dL (ref 0–200)
HDL: 34.5 mg/dL — ABNORMAL LOW (ref >39.00)
LDL Cholesterol: 62 mg/dL (ref 0–99)
NonHDL: 85.71
Total CHOL/HDL Ratio: 3
Triglycerides: 120 mg/dL (ref 0.0–149.0)
VLDL: 24 mg/dL (ref 0.0–40.0)

## 2021-06-09 LAB — HEMOGLOBIN A1C: Hgb A1c MFr Bld: 8.8 % — ABNORMAL HIGH (ref 4.6–6.5)

## 2021-06-09 LAB — MICROALBUMIN / CREATININE URINE RATIO
Creatinine,U: 70.6 mg/dL
Microalb Creat Ratio: 1.7 mg/g (ref 0.0–30.0)
Microalb, Ur: 1.2 mg/dL (ref 0.0–1.9)

## 2021-06-09 LAB — PSA: PSA: 0.39 ng/mL (ref 0.10–4.00)

## 2021-06-09 NOTE — Progress Notes (Signed)
?Jordan Cline is a 46 y.o. male with the following history as recorded in EpicCare:  ?Patient Active Problem List  ? Diagnosis Date Noted  ? Morbid obesity ([place]) 12/06/2017  ? Hyperlipidemia 09/26/2017  ? Routine general medical examination at a health care facility 08/23/2017  ? Erectile dysfunction associated with type 2 diabetes mellitus ([place]) 09/22/2014  ? Type II diabetes mellitus ([place]) 05/13/2014  ? OSA (obstructive sleep apnea) 05/13/2014  ? Excessive daytime sleepiness 04/20/2014  ? PFO (patent foramen ovale) 03/19/2014  ? Borderline hypertension   ? PE (pulmonary thromboembolism) ([place])   ?  ?Current Outpatient Medications  ?Medication Sig Dispense Refill  ? albuterol (VENTOLIN HFA) 108 (90 Base) MCG/ACT inhaler Inhale 2 puffs into the lungs every 6 (six) hours as needed for wheezing or shortness of breath. 8 g 2  ? blood glucose meter kit and supplies KIT Dispense based on patient and insurance preference. Use up to four times daily as directed. (FOR ICD-9 250.00, 250.01). 1 each 0  ? dapagliflozin propanediol (FARXIGA) 10 MG TABS tablet Take 1 tablet (10 mg total) by mouth daily. 90 tablet 1  ? Dulaglutide (TRULICITY) 4.5 NO/7.0JG SOPN Inject 4.5 mg as directed once a week. 2 mL 3  ? famotidine (PEPCID) 20 MG tablet TAKE 1 TABLET (20 MG TOTAL) BY MOUTH 2 (TWO) TIMES DAILY AS NEEDED FOR HEARTBURN OR INDIGESTION. 180 tablet 1  ? glucose blood (ACCU-CHEK AVIVA PLUS) test strip USE AS DIRECTED 4 TIMES A DAY 100 each 2  ? lisinopril (ZESTRIL) 10 MG tablet TAKE 1 TABLET BY MOUTH EVERY DAY 90 tablet 3  ? metFORMIN (GLUCOPHAGE-XR) 500 MG 24 hr tablet TAKE 4 TABLETS BY MOUTH DAILY WITH BREAKFAST 120 tablet 0  ? pantoprazole (PROTONIX) 40 MG tablet Take 1 tablet (40 mg total) by mouth 2 (two) times daily. 90 tablet 2  ? rivaroxaban (XARELTO) 10 MG TABS tablet Take 1 tablet (10 mg total) by mouth daily. 30 tablet 11  ? rosuvastatin (CRESTOR) 10 MG tablet TAKE 1 TABLET BY MOUTH EVERY DAY 90 tablet 3  ? folic  acid (FOLVITE) 1 MG tablet TAKE 1 TABLET BY MOUTH EVERY DAY (Patient not taking: Reported on 06/09/2021) 90 tablet 2  ? ?No current facility-administered medications for this visit.  ?  ?Allergies: Patient has no known allergies.  ?Past Medical History:  ?Diagnosis Date  ? Acute deep vein thrombosis (DVT) of popliteal vein of right lower extremity ([place]) 03/2014  ? Allergy   ? Borderline hypertension   ? Diabetes mellitus type 2 in obese ([place]) 03/2014  ? HgbA1c 13.0  ? 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 s/p TPA  ? Sleep apnea   ?  ?No past surgical history on file.  ?Family History  ?Problem Relation Age of Onset  ? Arrhythmia Mother   ? Breast cancer Mother   ? Hyperlipidemia Mother   ? Diabetes Father   ? Hypertension Father   ? Cancer Maternal Uncle   ? Cancer Maternal Grandfather   ? Breast cancer Maternal Grandmother   ? Stroke Paternal Grandmother   ?  ?Social History  ? ?Tobacco Use  ? Smoking status: Former  ?  Packs/day: 0.00  ?  Years: 12.00  ?  Pack years: 0.00  ?  Types: Cigarettes  ?  Quit date: 02/13/2014  ?  Years since quitting: 7.3  ? Smokeless tobacco: Never  ?Substance Use Topics  ? Alcohol use: Yes  ?  Alcohol/week: 0.0 standard drinks  ?  Comment: occasionallly  ?  ?Subjective:  ? ?Follow up on Type 2 Diabetes; has been taking 2 Metformin at night/ inconsistent with lunch time dosing; on Farxiga and Trulicity as well; Denies any chest pain, shortness of breath, blurred vision or headache; ?Denies any concerns for low blood sugar;  ? ? ? ?Objective:  ?Vitals:  ? 06/09/21 0946  ?BP: 104/70  ?Pulse: 89  ?Temp: 98.7 ?F (37.1 ?C)  ?TempSrc: Oral  ?SpO2: 97%  ?Weight: (!) 310 lb (140.6 kg)  ?Height: 6' (1.829 m)  ?  ?General: Well developed, well nourished, in no acute distress  ?Skin : Warm and dry.  ?Head: Normocephalic and atraumatic  ?Eyes: Sclera and conjunctiva clear; pupils round and reactive to light; extraocular movements intact  ?Ears: External normal;  canals clear; tympanic membranes normal  ?Oropharynx: Pink, supple. No suspicious lesions  ?Neck: Supple without thyromegaly, adenopathy  ?Lungs: Respirations unlabored; clear to auscultation bilaterally without wheeze, rales, rhonchi  ?CVS exam: normal rate and regular rhythm.  ?Neurologic: Alert and oriented; speech intact; face symmetrical; moves all extremities well; CNII-XII intact without focal deficit  ?Assessment:  ?1. Type 2 diabetes mellitus without complication, without long-term current use of insulin ([place])   ?2. Prostate cancer screening   ?3. Type 2 diabetes mellitus with chronic kidney disease, without long-term current use of insulin, unspecified CKD stage (HCC) Chronic  ?4. Morbid obesity (HCC) Chronic  ?5. 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 Encounter for screening colonoscopy   ?  ?Plan:  ?Update labs today- discussed changing from Trulicity to [facility]; discussed risks/ benefits but feel [facility] will be more beneficial for weight loss goals as well as diabetes control; patient in agreement; follow up to be determined; ?Check PSA today; ?5.   Continue Xarelto- at this time plan to continue for lifetime treatment; ?6.   Order updated;  ? ?No follow-ups on file.  ?Orders Placed This Encounter  ?Procedures  ? CBC with Differential/Platelet  ? Comp Met (CMET)  ? Hemoglobin A1c  ? Lipid panel  ? PSA  ? Urine Microalbumin w/creat. ratio  ? Ambulatory referral to Gastroenterology  ?  Referral Priority:   Routine  ?  Referral Type:   Consultation  ?  Referral Reason:   Specialty Services Required  ?  Number of Visits Requested:   1  ?  ?Requested Prescriptions  ? ? No prescriptions requested or ordered in this encounter  ?  ? ?

## 2021-06-10 ENCOUNTER — Other Ambulatory Visit: Payer: Self-pay | Admitting: Family

## 2021-06-10 ENCOUNTER — Telehealth: Payer: Self-pay | Admitting: Family

## 2021-06-10 MED ORDER — TIRZEPATIDE 5 MG/0.5ML ~~LOC~~ SOAJ: 5.0000 mg | SUBCUTANEOUS | 0 refills | Status: DC

## 2021-06-10 MED ORDER — PIOGLITAZONE HCL 30 MG PO TABS: 30.0000 mg | ORAL_TABLET | Freq: Every day | ORAL | 1 refills | Status: DC

## 2021-06-10 MED ORDER — TRULICITY 4.5 MG/0.5ML ~~LOC~~ SOAJ: 4.5000 mg | SUBCUTANEOUS | 3 refills | Status: DC

## 2021-06-10 NOTE — Telephone Encounter (Signed)
Patient states his insurance wont cover the [place], but they said that if a PA is sent they might be able to cover it. He stated he tried using the discount card and it was still over 500 dollars. Please advise. ?

## 2021-06-10 NOTE — Telephone Encounter (Signed)
I have called pt and relayed the message. He stated understanding and will call us back to scheduled the follow up appt when he has his work scheduled with him.  ?

## 2021-07-13 DIAGNOSIS — G4733 Obstructive sleep apnea (adult) (pediatric): Secondary | ICD-10-CM | POA: Diagnosis not present

## 2021-09-01 ENCOUNTER — Ambulatory Visit (INDEPENDENT_AMBULATORY_CARE_PROVIDER_SITE_OTHER): Payer: 59 | Admitting: Family

## 2021-09-01 ENCOUNTER — Encounter: Payer: Self-pay | Admitting: Family

## 2021-09-01 DIAGNOSIS — I2602 Saddle embolus of pulmonary artery with acute cor pulmonale: Secondary | ICD-10-CM

## 2021-09-01 DIAGNOSIS — N189 Chronic kidney disease, unspecified: Secondary | ICD-10-CM

## 2021-09-01 DIAGNOSIS — E1122 Type 2 diabetes mellitus with diabetic chronic kidney disease: Secondary | ICD-10-CM | POA: Diagnosis not present

## 2021-09-01 LAB — COMPREHENSIVE METABOLIC PANEL
ALT: 23 U/L (ref 0–53)
AST: 17 U/L (ref 0–37)
Albumin: 4.3 g/dL (ref 3.5–5.2)
Alkaline Phosphatase: 60 U/L (ref 39–117)
BUN: 11 mg/dL (ref 6–23)
CO2: 28 mEq/L (ref 19–32)
Calcium: 9.6 mg/dL (ref 8.4–10.5)
Chloride: 102 mEq/L (ref 96–112)
Creatinine, Ser: 1.04 mg/dL (ref 0.40–1.50)
GFR: 86.94 mL/min (ref >60.00)
Glucose, Bld: 118 mg/dL — ABNORMAL HIGH (ref 70–99)
Potassium: 4.6 mEq/L (ref 3.5–5.1)
Sodium: 139 mEq/L (ref 135–145)
Total Bilirubin: 0.3 mg/dL (ref 0.2–1.2)
Total Protein: 6.9 g/dL (ref 6.0–8.3)

## 2021-09-01 LAB — HEMOGLOBIN A1C: Hgb A1c MFr Bld: 8.1 % — ABNORMAL HIGH (ref 4.6–6.5)

## 2021-09-01 MED ORDER — DAPAGLIFLOZIN PROPANEDIOL 10 MG PO TABS: 10.0000 mg | ORAL_TABLET | Freq: Every day | ORAL | 1 refills | Status: DC

## 2021-09-01 NOTE — Progress Notes (Signed)
Jordan Cline is a 46 y.o. 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 8 g 2   blood glucose meter kit and supplies KIT Dispense based on patient and insurance preference. Use up to four times daily as directed. (FOR ICD-9 250.00, 250.01). 1 each 0   Dulaglutide (TRULICITY) 4.5 WI/0.9BD SOPN Inject 4.5 mg as directed once a week. 2 mL 3   famotidine (PEPCID) 20 MG tablet TAKE 1 TABLET (20 MG TOTAL) BY MOUTH 2 (TWO) TIMES DAILY AS NEEDED FOR HEARTBURN OR INDIGESTION. 180 tablet 1   glucose blood (ACCU-CHEK AVIVA PLUS) test strip USE AS DIRECTED 4 TIMES A DAY 100 each 2   lisinopril (ZESTRIL) 10 MG tablet TAKE 1 TABLET BY MOUTH EVERY DAY 90 tablet 3   metFORMIN (GLUCOPHAGE-XR) 500 MG 24 hr tablet TAKE 4 TABLETS BY MOUTH DAILY WITH BREAKFAST 120 tablet 0   pantoprazole (PROTONIX) 40 MG tablet Take 1 tablet (40 mg total) by mouth 2 (two) times daily. 90 tablet 2   pioglitazone (ACTOS) 30 MG tablet Take 1 tablet (30 mg total) by mouth daily. 90 tablet 1   rivaroxaban (XARELTO) 10 MG TABS tablet Take 1 tablet (10 mg total) by mouth daily. 30 tablet 11   rosuvastatin  (CRESTOR) 10 MG tablet TAKE 1 TABLET BY MOUTH EVERY DAY 90 tablet 3   dapagliflozin propanediol (FARXIGA) 10 MG TABS tablet Take 1 tablet (10 mg total) by mouth daily. 90 tablet 1   folic acid (FOLVITE) 1 MG tablet TAKE 1 TABLET BY MOUTH EVERY DAY (Patient not taking: Reported on 06/09/2021) 90 tablet 2   No current facility-administered medications for this visit.    Allergies: Patient has no known allergies.  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  Family History  Problem Relation Age of Onset   Arrhythmia Mother    Breast cancer Mother    Hyperlipidemia Mother    Diabetes Father    Hypertension Father    Cancer Maternal Uncle    Cancer Maternal Grandfather    Breast cancer Maternal Grandmother    Stroke Paternal Grandmother     Social History   Tobacco Use   Smoking status: Former    Packs/day: 0.00    Years: 12.00    Total pack years: 0.00  Types: Cigarettes    Quit date: 02/13/2014    Years since quitting: 7.5   Smokeless tobacco: Never  Substance Use Topics   Alcohol use: Yes    Alcohol/week: 0.0 standard drinks of alcohol    Comment: occasionallly    Subjective:   3 month follow up on Type 2 Diabetes; last Hgba1c was 8.8; patient does not want to take insulin; Trulicity was increased to 4.5 mg in April; Taking [place] and Actos in the am; only taking 2 Metformin at night; Denies any chest pain, shortness of breath, blurred vision or headache No concerns for low blood sugars;     Objective:  Vitals:   09/01/21 0847  BP: 100/60  Pulse: 86  Resp: 18  Temp: 98.2 F (36.8 C)  TempSrc: Oral  SpO2: 99%  Weight: (!) 310 lb 6.4 oz  (140.8 kg)  Height: 6' (1.829 m)    General: Well developed, well nourished, in no acute distress  Skin : Warm and dry.  Head: Normocephalic and atraumatic  Eyes: Sclera and conjunctiva clear; pupils round and reactive to light; extraocular movements intact  Ears: External normal; canals clear; tympanic membranes normal  Oropharynx: Pink, supple. No suspicious lesions  Neck: Supple without thyromegaly, adenopathy  Lungs: Respirations unlabored; clear to auscultation bilaterally without wheeze, rales, rhonchi  CVS exam: normal rate and regular rhythm.  Neurologic: Alert and oriented; speech intact; face symmetrical; moves all extremities well; CNII-XII intact without focal deficit   Assessment:  1. Type 2 diabetes mellitus with chronic kidney disease, without long-term current use of insulin, unspecified CKD stage (HCC) Chronic  2. Morbid obesity (HCC) Chronic  3. 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  Orders Placed This Encounter  Procedures   Comp Met (CMET)   Hemoglobin A1c    Requested Prescriptions   Signed Prescriptions Disp Refills   dapagliflozin propanediol (FARXIGA) 10 MG TABS tablet 90 tablet 1    Sig: Take 1 tablet (10 mg total) by mouth daily.

## 2021-09-08 ENCOUNTER — Other Ambulatory Visit: Payer: Self-pay | Admitting: Family

## 2021-09-08 DIAGNOSIS — E118 Type 2 diabetes mellitus with unspecified complications: Secondary | ICD-10-CM

## 2021-09-08 DIAGNOSIS — I10 Essential (primary) hypertension: Secondary | ICD-10-CM

## 2021-09-12 ENCOUNTER — Encounter: Payer: Self-pay | Admitting: Family

## 2021-09-14 ENCOUNTER — Other Ambulatory Visit: Payer: Self-pay | Admitting: Family

## 2021-09-14 DIAGNOSIS — E785 Hyperlipidemia, unspecified: Secondary | ICD-10-CM

## 2021-09-14 DIAGNOSIS — E1122 Type 2 diabetes mellitus with diabetic chronic kidney disease: Secondary | ICD-10-CM

## 2021-10-09 ENCOUNTER — Other Ambulatory Visit: Payer: Self-pay | Admitting: Family

## 2021-12-01 ENCOUNTER — Telehealth: Payer: Self-pay

## 2021-12-01 ENCOUNTER — Ambulatory Visit (INDEPENDENT_AMBULATORY_CARE_PROVIDER_SITE_OTHER): Payer: 59 | Admitting: Family

## 2021-12-01 ENCOUNTER — Encounter: Payer: Self-pay | Admitting: Family

## 2021-12-01 VITALS — BP 118/60 | HR 92 | Temp 98.2°F | Ht 72.0 in | Wt 308.8 lb

## 2021-12-01 DIAGNOSIS — E1122 Type 2 diabetes mellitus with diabetic chronic kidney disease: Secondary | ICD-10-CM | POA: Diagnosis not present

## 2021-12-01 DIAGNOSIS — Z1211 Encounter for screening for malignant neoplasm of colon: Secondary | ICD-10-CM | POA: Diagnosis not present

## 2021-12-01 LAB — COMPREHENSIVE METABOLIC PANEL
ALT: 29 U/L (ref 0–53)
AST: 25 U/L (ref 0–37)
Albumin: 4.5 g/dL (ref 3.5–5.2)
Alkaline Phosphatase: 58 U/L (ref 39–117)
BUN: 10 mg/dL (ref 6–23)
CO2: 28 mEq/L (ref 19–32)
Calcium: 9.4 mg/dL (ref 8.4–10.5)
Chloride: 100 mEq/L (ref 96–112)
Creatinine, Ser: 0.94 mg/dL (ref 0.40–1.50)
GFR: 97.98 mL/min (ref >60.00)
Glucose, Bld: 156 mg/dL — ABNORMAL HIGH (ref 70–99)
Potassium: 4.3 mEq/L (ref 3.5–5.1)
Sodium: 136 mEq/L (ref 135–145)
Total Bilirubin: 0.4 mg/dL (ref 0.2–1.2)
Total Protein: 7.1 g/dL (ref 6.0–8.3)

## 2021-12-01 LAB — HEMOGLOBIN A1C: Hgb A1c MFr Bld: 8.7 % — ABNORMAL HIGH (ref 4.6–6.5)

## 2021-12-01 MED ORDER — METFORMIN HCL ER 500 MG PO TB24: ORAL_TABLET | ORAL | 2 refills | Status: DC

## 2021-12-01 MED ORDER — DAPAGLIFLOZIN PROPANEDIOL 10 MG PO TABS: 10.0000 mg | ORAL_TABLET | Freq: Every day | ORAL | 1 refills | Status: DC

## 2021-12-01 MED ORDER — TRULICITY 4.5 MG/0.5ML ~~LOC~~ SOAJ
4.5000 mg | SUBCUTANEOUS | 3 refills | Status: DC
Start: 2021-12-01 — End: 2022-03-17

## 2021-12-01 MED ORDER — PIOGLITAZONE HCL 30 MG PO TABS: 30.0000 mg | ORAL_TABLET | Freq: Every day | ORAL | 1 refills | Status: DC

## 2021-12-01 NOTE — Telephone Encounter (Signed)
I have called the pharmacy and they stated that the trulicity and farxiga's PA has been approved and the pharmacy is working on the refills currently.

## 2021-12-01 NOTE — Progress Notes (Signed)
Jordan Cline is a 46 y.o. 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 8 g 2   blood glucose meter kit and supplies KIT Dispense based on patient and insurance preference. Use up to four times daily as directed. (FOR ICD-9 250.00, 250.01). 1 each 0   glucose blood (ACCU-CHEK AVIVA PLUS) test strip USE AS DIRECTED 4 TIMES A DAY 100 each 2   lisinopril (ZESTRIL) 10 MG tablet TAKE 1 TABLET BY MOUTH EVERY DAY 90 tablet 3   rivaroxaban (XARELTO) 10 MG TABS tablet Take 1 tablet (10 mg total) by mouth daily. 30 tablet 11   rosuvastatin (CRESTOR) 10 MG tablet TAKE 1 TABLET BY MOUTH EVERY DAY 90 tablet 3   dapagliflozin propanediol (FARXIGA) 10 MG TABS tablet Take 1 tablet (10 mg total) by mouth daily. 90 tablet 1   Dulaglutide (TRULICITY) 4.5 IO/9.7DZ SOPN Inject 4.5 mg as directed once a week. 2 mL 3   famotidine (PEPCID) 20 MG tablet TAKE 1 TABLET (20 MG TOTAL) BY MOUTH 2 (TWO) TIMES DAILY AS NEEDED FOR HEARTBURN OR INDIGESTION. (Patient not taking: Reported on 12/01/2021) 180 tablet 1   metFORMIN (GLUCOPHAGE-XR) 500 MG 24 hr tablet TAKE 2 TABLETS BY MOUTH PO BID 120  tablet 2   pioglitazone (ACTOS) 30 MG tablet Take 1 tablet (30 mg total) by mouth daily. 90 tablet 1   No current facility-administered medications for this visit.    Allergies: Patient has no known allergies.  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  Family History  Problem Relation Age of Onset   Arrhythmia Mother    Breast cancer Mother    Hyperlipidemia Mother    Diabetes Father    Hypertension Father    Cancer Maternal Uncle    Cancer Maternal Grandfather    Breast cancer Maternal Grandmother    Stroke Paternal Grandmother     Social History   Tobacco Use   Smoking status: Former    Packs/day: 0.00    Years: 12.00    Total pack years: 0.00    Types: Cigarettes    Quit date: 02/13/2014    Years since quitting: 7.8   Smokeless tobacco: Never  Substance Use Topics   Alcohol use: Yes    Alcohol/week: 0.0 standard drinks of alcohol  Comment: occasionallly    Subjective:   3 month follow up on Type 2 Diabetes; has only been taking Metformin since the end of August; has been trying to take 2 in the am and 2 in the pm; has been taking Actos daily; Per patient, his insurance would not cover the [place] or Trulicity- no calls to our office were documented requesting PA; patient was hesitant to reach out because voicemail indicated that should only call pharmacy if refills are needed on medication. Is trying to watch his diet more closely;     Objective:  Vitals:   12/01/21 0952  BP: 118/60  Pulse: 92  Temp: 98.2 F (36.8 C)  TempSrc: Oral  SpO2: 97%  Weight: (!) 308 lb 12.8 oz (140.1 kg)  Height: 6' (1.829 m)    General: Well  developed, well nourished, in no acute distress  Skin : Warm and dry.  Head: Normocephalic and atraumatic  Eyes: Sclera and conjunctiva clear; pupils round and reactive to light; extraocular movements intact  Ears: External normal; canals clear; tympanic membranes normal  Oropharynx: Pink, supple. No suspicious lesions  Neck: Supple without thyromegaly, adenopathy  Lungs: Respirations unlabored; clear to auscultation bilaterally without wheeze, rales, rhonchi  CVS exam: normal rate and regular rhythm.  Neurologic: Alert and oriented; speech intact; face symmetrical; moves all extremities well; CNII-XII intact without focal deficit  Assessment:  1. Type 2 diabetes mellitus with chronic kidney disease, without long-term current use of insulin, unspecified CKD stage ([place])   2. Encounter for screening colonoscopy     Plan:  Appears to have been extensive confusion with his medications; per patient, he has not been on [place] or Trulicity since August; no information received at our office from pharmacy or patient that he was unable to get medications; will reach out to his pharmacy to get clarification; He understands in the future to please let our office know if he is unable to get prescribed medications; Will update labs today- to consider insulin;   Referral to GI updated again for colonoscopy;   No follow-ups on file.  Orders Placed This Encounter  Procedures   Comp Met (CMET)   Hemoglobin A1c   Ambulatory referral to Gastroenterology    Referral Priority:   Routine    Referral Type:   Consultation    Referral Reason:   Specialty Services Required    Number of Visits Requested:   1    Requested Prescriptions   Signed Prescriptions Disp Refills   dapagliflozin propanediol (FARXIGA) 10 MG TABS tablet 90 tablet 1    Sig: Take 1 tablet (10 mg total) by mouth daily.   Dulaglutide (TRULICITY) 4.5 AC/2.9KO SOPN 2 mL 3    Sig: Inject 4.5 mg as directed once a week.   metFORMIN  (GLUCOPHAGE-XR) 500 MG 24 hr tablet 120 tablet 2    Sig: TAKE 2 TABLETS BY MOUTH PO BID   pioglitazone (ACTOS) 30 MG tablet 90 tablet 1    Sig: Take 1 tablet (30 mg total) by mouth daily.

## 2021-12-13 ENCOUNTER — Encounter: Payer: Self-pay | Admitting: Internal Medicine

## 2021-12-20 ENCOUNTER — Telehealth: Payer: Self-pay | Admitting: *Deleted

## 2021-12-20 ENCOUNTER — Encounter: Payer: Self-pay | Admitting: Gastroenterology

## 2021-12-20 NOTE — Telephone Encounter (Signed)
Patient is currently taking xarelto, I cancelled pre-visit and procedure and have him scheduled for an OV with APP.

## 2021-12-20 NOTE — Telephone Encounter (Signed)
Noted  

## 2021-12-20 NOTE — Telephone Encounter (Signed)
Call placed to pt. To clarify if he is taking Xarelto,pt.scheduled as direct and if taking anticoagulant pt. Will need OV,pre-visit and procedure will need to be cancelled.

## 2022-01-03 ENCOUNTER — Encounter: Payer: 59 | Admitting: Internal Medicine

## 2022-01-27 ENCOUNTER — Telehealth: Payer: Self-pay | Admitting: *Deleted

## 2022-01-27 ENCOUNTER — Ambulatory Visit (INDEPENDENT_AMBULATORY_CARE_PROVIDER_SITE_OTHER): Payer: 59 | Admitting: Gastroenterology

## 2022-01-27 ENCOUNTER — Encounter: Payer: Self-pay | Admitting: Gastroenterology

## 2022-01-27 VITALS — BP 104/80 | HR 86 | Ht 72.0 in | Wt 320.1 lb

## 2022-01-27 DIAGNOSIS — Z1211 Encounter for screening for malignant neoplasm of colon: Secondary | ICD-10-CM | POA: Diagnosis not present

## 2022-01-27 DIAGNOSIS — Z7901 Long term (current) use of anticoagulants: Secondary | ICD-10-CM | POA: Insufficient documentation

## 2022-01-27 MED ORDER — PLENVU 140 G PO SOLR: 1.0000 | ORAL | 0 refills | Status: DC

## 2022-01-27 NOTE — Telephone Encounter (Signed)
Yes, ok for patient to hold Xarelto 2 days prior. Resume as scheduled following procedure, as long as cleared by gastroenterologist. Thanks.

## 2022-01-27 NOTE — Patient Instructions (Signed)
You have been scheduled for a colonoscopy. Please follow written instructions given to you at your visit today.  Please pick up your prep supplies at the pharmacy within the next 1-3 days. If you use inhalers (even only as needed), please bring them with you on the day of your procedure.   If you are age 45 or older, your body mass index should be between 23-30. Your Body mass index is 43.42 kg/m. If this is out of the aforementioned range listed, please consider follow up with your Primary Care Provider.  If you are age 64 or younger, your body mass index should be between 19-25. Your Body mass index is 43.42 kg/m. If this is out of the aformentioned range listed, please consider follow up with your Primary Care Provider.   ________________________________________________________  The [facility] providers would like to encourage you to use MYCHART to communicate with providers for non-urgent requests or questions.  Due to long hold times on the telephone, sending your provider a message by MYCHART may be a faster and more efficient way to get a response.  Please allow 48 business hours for a response.  Please remember that this is for non-urgent requests.  _______________________________________________________  Due to recent changes in healthcare laws, you may see the results of your imaging and laboratory studies on MyChart before your provider has had a chance to review them.  We understand that in some cases there may be results that are confusing or concerning to you. Not all laboratory results come back in the same time frame and the provider may be waiting for multiple results in order to interpret others.  Please give us 48 hours in order for your provider to thoroughly review all the results before contacting the office for clarification of your results.   

## 2022-01-27 NOTE — Telephone Encounter (Signed)
   Jordan Cline 18-Aug-1975 [id number]  Dear Micheline Maze, NP:  We have scheduled the above named patient for a colonoscopy procedure. Our records show that (s)he is on anticoagulation therapy.  Please advise as to whether the patient may come off their therapy of Xarelto 2 days prior to their procedure which is scheduled for 04/03/22.  Please route your response to Vernia Buff, CMA.  Sincerely,    [facility]

## 2022-01-27 NOTE — Telephone Encounter (Signed)
===  View-only below this line=== ----- Message ----- From: Noemi Chapel, NP Sent: 01/27/2022   9:34 AM EST To: Richardson Chiquito, CMA  Yes, ok for patient to hold Xarelto 2 days prior. Resume as scheduled following procedure, as long as cleared by gastroenterologist. Thanks.

## 2022-01-27 NOTE — Progress Notes (Signed)
01/27/2022 Jordan Cline [id number] Sep 19, 1975   HISTORY OF PRESENT ILLNESS: This is a 46 year old male who is new to our office.  He has been referred here by Marrian Salvage, FNP, for screening colonoscopy.  He has never had a colonoscopy in the past.  He denies any GI complaints.  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  He also has borderline hypertension and diabetes as well as sleep apnea. No family history of colon cancer.  No dark or bloody stools.  Moves his bowels well.  No abdominal pain.  He says that he has missed a dose or 2 his Xarelto before because he had run out and he has had no issues.  This was last prescribed by Marland Kitchen, NP.   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 No pertinent surgical history.  reports that he quit smoking about 7 years ago. His smoking use included cigarettes. He has never used smokeless tobacco. He reports current alcohol use. He reports that he does not use drugs. family history includes Arrhythmia in his mother; Breast cancer in his maternal grandmother and mother; Cancer in his maternal grandfather and maternal uncle; Diabetes in his father; Hyperlipidemia in his mother; Hypertension in his father; Stroke in his paternal grandmother. No Known Allergies    Outpatient Encounter Medications as of 01/27/2022  Medication Sig   albuterol (VENTOLIN HFA) 108 (90 Base) MCG/ACT inhaler Inhale 2 puffs into the lungs every 6 (six) hours as needed for wheezing or shortness of breath.   blood glucose meter kit and supplies KIT Dispense based on patient and insurance preference. Use up to four times daily as directed.  (FOR ICD-9 250.00, 250.01).   dapagliflozin propanediol (FARXIGA) 10 MG TABS tablet Take 1 tablet (10 mg total) by mouth daily.   Dulaglutide (TRULICITY) 4.5 ML/4.6TK SOPN Inject 4.5 mg as directed once a week.   famotidine (PEPCID) 20 MG tablet TAKE 1 TABLET (20 MG TOTAL) BY MOUTH 2 (TWO) TIMES DAILY AS NEEDED FOR HEARTBURN OR INDIGESTION.   glucose blood (ACCU-CHEK AVIVA PLUS) test strip USE AS DIRECTED 4 TIMES A DAY   lisinopril (ZESTRIL) 10 MG tablet TAKE 1 TABLET BY MOUTH EVERY DAY   metFORMIN (GLUCOPHAGE-XR) 500 MG 24 hr tablet TAKE 2 TABLETS BY MOUTH PO BID   pioglitazone (ACTOS) 30 MG tablet Take 1 tablet (30 mg total) by mouth daily.   rivaroxaban (XARELTO) 10 MG TABS tablet Take 1 tablet (10 mg total) by mouth daily.   rosuvastatin (CRESTOR) 10 MG tablet TAKE 1 TABLET BY MOUTH EVERY DAY   No facility-administered encounter medications on file as of 01/27/2022.    REVIEW OF SYSTEMS  : All other systems reviewed and negative except where noted in the History of Present Illness.   PHYSICAL EXAM: BP 104/80   Pulse 86   Ht 6' (1.829 m)   Wt (!) 320 lb 2 oz (145.2 kg)   BMI 43.42 kg/m  General: Well developed AA male in no acute distress Head: Normocephalic and atraumatic Eyes:  Sclerae anicteric, conjunctiva pink. Ears: Normal auditory acuity. Lungs: Clear  throughout to auscultation; no W/R/R. Heart: Regular rate and rhythm; no M/R/G. Abdomen: Soft, non-distended.  BS present.  Non-tender. Rectal:  Will be done at the time of colonoscopy. Musculoskeletal: Symmetrical with no gross deformities  Skin: No lesions on visible extremities Extremities: No edema  Neurological: Alert oriented x 4, grossly non-focal Psychological:  Alert and cooperative. Normal mood and affect  ASSESSMENT AND PLAN: *CRC screening:  Never had a colonoscopy in the past.  Will schedule with Dr. Lyndel Safe. *Chronic anticoagulation with Xarelto for history of PE and DVT:  Will hold Xarelto for 48 hours days  prior to endoscopic procedures - will instruct when and how to resume after procedure. Benefits and risks of procedure explained including risks of bleeding, perforation, infection, missed lesions, reactions to medications and possible need for hospitalization and surgery for complications. Additional rare but real risk of stroke or other vascular clotting events off of Xarelto also explained and need to seek urgent help if any signs of these problems occur. Will communicate by phone or EMR with patient's prescribing provider, Marland Kitchen, NP, to confirm that holding Xarelto is reasonable in this case.     CC:  Marrian Salvage,*

## 2022-01-27 NOTE — Telephone Encounter (Signed)
I have spoken to patient to advise that he has been given clearance to hold Xarelto 2 days prior to his upcoming procedure. Patient verbalizes understanding of this information.

## 2022-01-29 NOTE — Progress Notes (Signed)
Agree with assessment/plan.  Raj Elroy Schembri, MD [facility] [phone]  

## 2022-01-31 ENCOUNTER — Ambulatory Visit: Payer: 59 | Admitting: Family

## 2022-02-14 ENCOUNTER — Encounter: Payer: Self-pay | Admitting: Family

## 2022-02-14 ENCOUNTER — Ambulatory Visit (INDEPENDENT_AMBULATORY_CARE_PROVIDER_SITE_OTHER): Payer: 59 | Admitting: Family

## 2022-02-14 VITALS — BP 124/72 | HR 90 | Temp 98.4°F | Resp 18 | Ht 73.0 in | Wt 314.4 lb

## 2022-02-14 DIAGNOSIS — E119 Type 2 diabetes mellitus without complications: Secondary | ICD-10-CM | POA: Diagnosis not present

## 2022-02-14 LAB — CBC WITH DIFFERENTIAL/PLATELET
Basophils Absolute: 0 10*3/uL (ref 0.0–0.1)
Basophils Relative: 0.4 % (ref 0.0–3.0)
Eosinophils Absolute: 0.1 10*3/uL (ref 0.0–0.7)
Eosinophils Relative: 1.8 % (ref 0.0–5.0)
HCT: 45.4 % (ref 39.0–52.0)
Hemoglobin: 14.9 g/dL (ref 13.0–17.0)
Lymphocytes Relative: 32.6 % (ref 12.0–46.0)
Lymphs Abs: 1.7 10*3/uL (ref 0.7–4.0)
MCHC: 32.8 g/dL (ref 30.0–36.0)
MCV: 80.8 fl (ref 78.0–100.0)
Monocytes Absolute: 0.4 10*3/uL (ref 0.1–1.0)
Monocytes Relative: 8.3 % (ref 3.0–12.0)
Neutro Abs: 2.9 10*3/uL (ref 1.4–7.7)
Neutrophils Relative %: 56.9 % (ref 43.0–77.0)
Platelets: 317 10*3/uL (ref 150.0–400.0)
RBC: 5.61 Mil/uL (ref 4.22–5.81)
RDW: 16.9 % — ABNORMAL HIGH (ref 11.5–15.5)
WBC: 5.1 10*3/uL (ref 4.0–10.5)

## 2022-02-14 LAB — COMPREHENSIVE METABOLIC PANEL
ALT: 28 U/L (ref 0–53)
AST: 18 U/L (ref 0–37)
Albumin: 4.1 g/dL (ref 3.5–5.2)
Alkaline Phosphatase: 57 U/L (ref 39–117)
BUN: 9 mg/dL (ref 6–23)
CO2: 28 mEq/L (ref 19–32)
Calcium: 9.2 mg/dL (ref 8.4–10.5)
Chloride: 101 mEq/L (ref 96–112)
Creatinine, Ser: 0.9 mg/dL (ref 0.40–1.50)
GFR: 103.08 mL/min (ref >60.00)
Glucose, Bld: 151 mg/dL — ABNORMAL HIGH (ref 70–99)
Potassium: 4.6 mEq/L (ref 3.5–5.1)
Sodium: 139 mEq/L (ref 135–145)
Total Bilirubin: 0.3 mg/dL (ref 0.2–1.2)
Total Protein: 6.8 g/dL (ref 6.0–8.3)

## 2022-02-14 LAB — HEMOGLOBIN A1C: Hgb A1c MFr Bld: 8.4 % — ABNORMAL HIGH (ref 4.6–6.5)

## 2022-02-14 NOTE — Progress Notes (Signed)
Jordan Cline is a 47 y.o. 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 8 g 2   blood glucose meter kit and supplies KIT Dispense based on patient and insurance preference. Use up to four times daily as directed. (FOR ICD-9 250.00, 250.01). 1 each 0   dapagliflozin propanediol (FARXIGA) 10 MG TABS tablet Take 1 tablet (10 mg total) by mouth daily. 90 tablet 1   Dulaglutide (TRULICITY) 4.5 WC/5.8NI SOPN Inject 4.5 mg as directed once a week. 2 mL 3   famotidine (PEPCID) 20 MG tablet TAKE 1 TABLET (20 MG TOTAL) BY MOUTH 2 (TWO) TIMES DAILY AS NEEDED FOR HEARTBURN OR INDIGESTION. 180 tablet 1   glucose blood (ACCU-CHEK AVIVA PLUS) test strip USE AS DIRECTED 4 TIMES A DAY 100 each 2   lisinopril (ZESTRIL) 10 MG tablet TAKE 1 TABLET BY MOUTH EVERY DAY 90 tablet 3   metFORMIN (GLUCOPHAGE-XR) 500 MG 24 hr tablet TAKE 2 TABLETS BY MOUTH PO BID 120 tablet 2   PEG-KCl-NaCl-NaSulf-Na Asc-C (PLENVU) 140 g SOLR Take 1 kit by mouth as directed. Use coupon: BIN: [id number] PNC: CNRX Group: [id number] ID: [id number] 1 each 0   pioglitazone (ACTOS) 30 MG tablet Take 1 tablet (30 mg  total) by mouth daily. 90 tablet 1   rivaroxaban (XARELTO) 10 MG TABS tablet Take 1 tablet (10 mg total) by mouth daily. 30 tablet 11   rosuvastatin (CRESTOR) 10 MG tablet TAKE 1 TABLET BY MOUTH EVERY DAY 90 tablet 3   No current facility-administered medications for this visit.    Allergies: Patient has no known allergies.  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  Family History  Problem Relation Age of Onset   Arrhythmia Mother    Breast cancer Mother    Hyperlipidemia Mother    Diabetes Father    Hypertension Father    Cancer Maternal Uncle    Cancer Maternal Grandfather    Breast cancer Maternal Grandmother    Stroke Paternal Grandmother     Social History   Tobacco Use   Smoking status: Former    Packs/day: 0.00    Years: 12.00    Total pack years: 0.00    Types: Cigarettes    Quit date: 02/13/2014    Years since quitting: 8.0  Smokeless tobacco: Never  Substance Use Topics   Alcohol use: Yes    Alcohol/week: 0.0 standard drinks of alcohol    Comment: occasionallly    Subjective:  3 month follow up on Type 2 Diabetes; has been able to get back on medications and has lost 6 pounds since last OV; Denies any chest pain, shortness of breath, blurred vision or headache; no concerns for episodes of low blood sugar;    Objective:  Vitals:   02/14/22 0828  BP: 124/72  Pulse: 90  Resp: 18  Temp: 98.4 F (36.9 C)  TempSrc: Oral  SpO2: 98%  Weight: (!) 314 lb 6.4 oz (142.6 kg)  Height: _0  (1.854 m)    General: Well developed, well nourished, in no acute distress  Skin : Warm and dry.  Head: Normocephalic and atraumatic  Lungs:  Respirations unlabored;  Neurologic: Alert and oriented; speech intact; face symmetrical; moves all extremities well; CNII-XII intact without focal deficit   Assessment:  1. Type 2 diabetes mellitus without complication, unspecified whether long term insulin use ([place])     Plan:  Patient is back on medication and doing well; will update labs today; stressed again need to take medications as prescribed; follow up in 4-6 months, sooner prn based on labs.   No follow-ups on file.  Orders Placed This Encounter  Procedures   CBC with Differential/Platelet   Comp Met (CMET)   Hemoglobin A1c    Requested Prescriptions    No prescriptions requested or ordered in this encounter

## 2022-02-25 ENCOUNTER — Other Ambulatory Visit: Payer: Self-pay | Admitting: Family

## 2022-03-16 ENCOUNTER — Telehealth: Payer: Self-pay | Admitting: Family

## 2022-03-16 NOTE — Telephone Encounter (Signed)
Pt just wanted to let pcp know that he has not been taking Dulaglutide (TRULICITY) 4.5 JY/7.8GN SOPN for the last couple weeks because it is on backorder. He has already contacted other pharmacies with no luck.

## 2022-03-17 ENCOUNTER — Other Ambulatory Visit: Payer: Self-pay | Admitting: Family

## 2022-03-17 MED ORDER — TIRZEPATIDE 2.5 MG/0.5ML ~~LOC~~ SOAJ: 2.5000 mg | SUBCUTANEOUS | 0 refills | Status: DC

## 2022-03-17 NOTE — Telephone Encounter (Signed)
Spoke with Pt,Pt stated he is okay with stopping the trulicity and starting on the monjaro. Pt was advised to call the office back if insurance wont cover the [facility].

## 2022-03-24 ENCOUNTER — Encounter: Payer: Self-pay | Admitting: Gastroenterology

## 2022-04-03 ENCOUNTER — Encounter: Payer: Self-pay | Admitting: Gastroenterology

## 2022-04-03 ENCOUNTER — Ambulatory Visit (AMBULATORY_SURGERY_CENTER): Payer: 59 | Admitting: Gastroenterology

## 2022-04-03 VITALS — BP 111/65 | HR 80 | Temp 98.4°F | Resp 19 | Ht 72.0 in | Wt 320.0 lb

## 2022-04-03 DIAGNOSIS — Z1211 Encounter for screening for malignant neoplasm of colon: Secondary | ICD-10-CM | POA: Diagnosis not present

## 2022-04-03 DIAGNOSIS — G4733 Obstructive sleep apnea (adult) (pediatric): Secondary | ICD-10-CM | POA: Diagnosis not present

## 2022-04-03 DIAGNOSIS — E119 Type 2 diabetes mellitus without complications: Secondary | ICD-10-CM | POA: Diagnosis not present

## 2022-04-03 DIAGNOSIS — I10 Essential (primary) hypertension: Secondary | ICD-10-CM | POA: Diagnosis not present

## 2022-04-03 MED ORDER — SODIUM CHLORIDE 0.9 % IV SOLN: 500.0000 mL | INTRAVENOUS | Status: DC

## 2022-04-03 NOTE — Progress Notes (Signed)
Report to PACU, RN, vss, BBS= Clear.  

## 2022-04-03 NOTE — Op Note (Signed)
[facility] Patient Name: Jordan Cline Procedure Date: 04/03/2022 11:35 AM MRN: [id number] Endoscopist: Jackquline Denmark , MD, [id number] Age: 47 Referring MD:  Date of Birth: Oct 21, 1975 Gender: Male Account #: [id number] Procedure:                Colonoscopy Indications:              Screening for colorectal malignant neoplasm Medicines:                Monitored Anesthesia Care Procedure:                Pre-Anesthesia Assessment:                           - Prior to the procedure, a History and Physical                            was performed, and patient medications and                            allergies were reviewed. The patient's tolerance of                            previous anesthesia was also reviewed. The risks                            and benefits of the procedure and the sedation                            options and risks were discussed with the patient.                            All questions were answered, and informed consent                            was obtained. Prior Anticoagulants: Xarelto was                            held 24 hours prior. ASA Grade Assessment: II - A                            patient with mild systemic disease. After reviewing                            the risks and benefits, the patient was deemed in                            satisfactory condition to undergo the procedure.                           After obtaining informed consent, the colonoscope                            was passed under direct vision. Throughout the  procedure, the patient's blood pressure, pulse, and                            oxygen saturations were monitored continuously. The                            Olympus [id number] Colonoscope was                            introduced through the anus and advanced to the the                            cecum, identified by appendiceal orifice and                             ileocecal valve. The colonoscopy was performed                            without difficulty. The patient tolerated the                            procedure well. The quality of the bowel                            preparation was good. The ileocecal valve,                            appendiceal orifice, and rectum were photographed. Scope In: 11:52:15 AM Scope Out: 12:03:02 PM Scope Withdrawal Time: 0 hours 7 minutes 31 seconds  Total Procedure Duration: 0 hours 10 minutes 47 seconds  Findings:                 Non-bleeding internal hemorrhoids were found during                            retroflexion. The hemorrhoids were small and Grade                            I (internal hemorrhoids that do not prolapse).                           The exam was otherwise without abnormality on                            direct and retroflexion views. Complications:            No immediate complications. Estimated Blood Loss:     Estimated blood loss: none. Impression:               - Non-bleeding internal hemorrhoids.                           - The examination was otherwise normal on direct                            and retroflexion views.                           -  No specimens collected. Recommendation:           - Patient has a contact number available for                            emergencies. The signs and symptoms of potential                            delayed complications were discussed with the                            patient. Return to normal activities tomorrow.                            Written discharge instructions were provided to the                            patient.                           - Resume previous diet.                           - Continue present medications.                           - Resume Xarelto (rivaroxaban) at prior dose today.                           - Repeat colonoscopy in 10 years for screening                            purposes. Earlier, if  with any new problems or                            change in family history.                           - The findings and recommendations were discussed                            with the patient's family. Jackquline Denmark, MD 04/03/2022 12:06:27 PM This report has been signed electronically.

## 2022-04-03 NOTE — Patient Instructions (Signed)
Handout on hemorrhoids given to you today  Repeat colonoscopy recommended in 10 years Resume Xarelto today as normal today   YOU HAD AN ENDOSCOPIC PROCEDURE TODAY AT [facility]:   Refer to the procedure report that was given to you for any specific questions about what was found during the examination.  If the procedure report does not answer your questions, please call your gastroenterologist to clarify.  If you requested that your care partner not be given the details of your procedure findings, then the procedure report has been included in a sealed envelope for you to review at your convenience later.  YOU SHOULD EXPECT: Some feelings of bloating in the abdomen. Passage of more gas than usual.  Walking can help get rid of the air that was put into your GI tract during the procedure and reduce the bloating. If you had a lower endoscopy (such as a colonoscopy or flexible sigmoidoscopy) you may notice spotting of blood in your stool or on the toilet paper. If you underwent a bowel prep for your procedure, you may not have a normal bowel movement for a few days.  Please Note:  You might notice some irritation and congestion in your nose or some drainage.  This is from the oxygen used during your procedure.  There is no need for concern and it should clear up in a day or so.  SYMPTOMS TO REPORT IMMEDIATELY:  Following lower endoscopy (colonoscopy or flexible sigmoidoscopy):  Excessive amounts of blood in the stool  Significant tenderness or worsening of abdominal pains  Swelling of the abdomen that is new, acute  Fever of 100F or higher  For urgent or emergent issues, a gastroenterologist can be reached at any hour by calling [phone]. Do not use MyChart messaging for urgent concerns.    DIET:  We do recommend a small meal at first, but then you may proceed to your regular diet.  Drink plenty of fluids but you should avoid alcoholic beverages for 24 hours.  ACTIVITY:   You should plan to take it easy for the rest of today and you should NOT DRIVE or use heavy machinery until tomorrow (because of the sedation medicines used during the test).    FOLLOW UP: Our staff will call the number listed on your records the next business day following your procedure.  We will call around 7:15- 8:00 am to check on you and address any questions or concerns that you may have regarding the information given to you following your procedure. If we do not reach you, we will leave a message.     SIGNATURES/CONFIDENTIALITY: You and/or your care partner have signed paperwork which will be entered into your electronic medical record.  These signatures attest to the fact that that the information above on your After Visit Summary has been reviewed and is understood.  Full responsibility of the confidentiality of this discharge information lies with you and/or your care-partner.

## 2022-04-03 NOTE — Progress Notes (Signed)
01/27/2022 Jordan Kuhlmann Losh MD:[id number] 1976-01-29     HISTORY OF PRESENT ILLNESS: This is a 47 year old male who is new to our office.  He has been referred here by Marrian Salvage, FNP, for screening colonoscopy.  He has never had a colonoscopy in the past.  He denies any GI complaints.  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  He also has borderline hypertension and diabetes as well as sleep apnea. No family history of colon cancer.  No dark or bloody stools.  Moves his bowels well.  No abdominal pain.   He says that he has missed a dose or 2 his Xarelto before because he had run out and he has had no issues.  This was last prescribed by Marland Kitchen, NP.         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 No pertinent surgical history.  reports that he quit smoking about 7 years ago. His smoking use included cigarettes. He has never used smokeless tobacco. He reports current alcohol use. He reports that he does not use drugs. family history includes Arrhythmia in his mother; Breast cancer in his maternal grandmother and mother; Cancer in his maternal grandfather and maternal uncle; Diabetes in his father; Hyperlipidemia in his mother; Hypertension in his father; Stroke in his paternal grandmother. No Known Allergies         Outpatient Encounter Medications as of 01/27/2022  Medication Sig   albuterol (VENTOLIN HFA) 108 (90 Base) MCG/ACT inhaler Inhale 2 puffs into the lungs every 6 (six) hours as needed for wheezing or shortness of breath.   blood glucose meter kit and supplies KIT Dispense based on patient and insurance preference. Use up to four  times daily as directed. (FOR ICD-9 250.00, 250.01).   dapagliflozin propanediol (FARXIGA) 10 MG TABS tablet Take 1 tablet (10 mg total) by mouth daily.   Dulaglutide (TRULICITY) 4.5 0000000 SOPN Inject 4.5 mg as directed once a week.   famotidine (PEPCID) 20 MG tablet TAKE 1 TABLET (20 MG TOTAL) BY MOUTH 2 (TWO) TIMES DAILY AS NEEDED FOR HEARTBURN OR INDIGESTION.   glucose blood (ACCU-CHEK AVIVA PLUS) test strip USE AS DIRECTED 4 TIMES A DAY   lisinopril (ZESTRIL) 10 MG tablet TAKE 1 TABLET BY MOUTH EVERY DAY   metFORMIN (GLUCOPHAGE-XR) 500 MG 24 hr tablet TAKE 2 TABLETS BY MOUTH PO BID   pioglitazone (ACTOS) 30 MG tablet Take 1 tablet (30 mg total) by mouth daily.   rivaroxaban (XARELTO) 10 MG TABS tablet Take 1 tablet (10 mg total) by mouth daily.   rosuvastatin (CRESTOR) 10 MG tablet TAKE 1 TABLET BY MOUTH EVERY DAY    No facility-administered encounter medications on file as of 01/27/2022.      REVIEW OF SYSTEMS  : All other systems reviewed and negative except where noted in the History of Present Illness.     PHYSICAL EXAM: BP 104/80   Pulse 86   Ht 6' (1.829 m)   Wt (!) 320 lb 2 oz (145.2 kg)   BMI 43.42 kg/m  General: Well developed AA male in no acute distress Head: Normocephalic and atraumatic Eyes:  Sclerae anicteric, conjunctiva pink. Ears: Normal auditory acuity. Lungs: Clear throughout to auscultation; no W/R/R. Heart: Regular rate and rhythm; no M/R/G. Abdomen: Soft, non-distended.  BS present.  Non-tender. Rectal:  Will be done at the time of colonoscopy. Musculoskeletal: Symmetrical with no gross deformities  Skin: No lesions on visible extremities Extremities: No edema  Neurological: Alert oriented x 4, grossly non-focal Psychological:  Alert and cooperative. Normal mood and affect   ASSESSMENT AND PLAN: *CRC screening:  Never had a colonoscopy in the past.  Will schedule with Dr. Lyndel Safe. *Chronic anticoagulation with Xarelto for history of PE and DVT:  Will  hold Xarelto for 48 hours days prior to endoscopic procedures - will instruct when and how to resume after procedure. Benefits and risks of procedure explained including risks of bleeding, perforation, infection, missed lesions, reactions to medications and possible need for hospitalization and surgery for complications. Additional rare but real risk of stroke or other vascular clotting events off of Xarelto also explained and need to seek urgent help if any signs of these problems occur. Will communicate by phone or EMR with patient's prescribing provider, Marland Kitchen, NP, to confirm that holding Xarelto is reasonable in this case.       CC:  Marrian Salvage,*    Attending physician's note   I have taken history, reviewed the chart and examined the patient. I performed a substantive portion of this encounter, including complete performance of at least one of the key components, in conjunction with the APP. I agree with the Advanced Practitioner's note, impression and recommendations.   Colon today   Carmell Austria, MD [facility] [phone]

## 2022-04-04 ENCOUNTER — Telehealth: Payer: Self-pay | Admitting: *Deleted

## 2022-04-04 NOTE — Telephone Encounter (Signed)
  Follow up Call-     04/03/2022   11:38 AM  Call back number  Post procedure Call Back phone  # [phone]  Permission to leave phone message Yes     Patient questions:  Do you have a fever, pain , or abdominal swelling? No. Pain Score  0 *  Have you tolerated food without any problems? Yes.    Have you been able to return to your normal activities? Yes.    Do you have any questions about your discharge instructions: Diet   No. Medications  No. Follow up visit  No.  Do you have questions or concerns about your Care? No.  Actions: * If pain score is 4 or above: No action needed, pain <4.

## 2022-04-16 ENCOUNTER — Telehealth: Payer: Self-pay | Admitting: Family

## 2022-04-17 NOTE — Telephone Encounter (Signed)
Would you like Pt to increase to '5mg'$ ?

## 2022-04-18 ENCOUNTER — Telehealth: Payer: Self-pay | Admitting: Family

## 2022-04-18 NOTE — Telephone Encounter (Signed)
Can you verify that he was able to get [facility] and got it started? There was a lot of confusion between whether he was going to stay on Trulicity or switch to [facility].    Is he okay with me increasing dosage to 5 mg? How are his blood sugars doing?

## 2022-04-18 NOTE — Telephone Encounter (Signed)
See other note- addressed.

## 2022-04-19 ENCOUNTER — Other Ambulatory Visit: Payer: Self-pay | Admitting: Family

## 2022-04-19 MED ORDER — TIRZEPATIDE 2.5 MG/0.5ML ~~LOC~~ SOAJ: 2.5000 mg | SUBCUTANEOUS | 0 refills | Status: DC

## 2022-04-19 NOTE — Telephone Encounter (Signed)
Pt notified and he will call back to scheduled appointment.  He had to check his schedule.

## 2022-04-19 NOTE — Telephone Encounter (Addendum)
Patient just started on the Mounjaro 2.'5mg'$  last Saturday (he had no gaps switching from the trulicity to [facility]).  His sugars have been running in the 120s.

## 2022-05-16 ENCOUNTER — Encounter: Payer: Self-pay | Admitting: Family

## 2022-05-16 ENCOUNTER — Ambulatory Visit (INDEPENDENT_AMBULATORY_CARE_PROVIDER_SITE_OTHER): Payer: 59 | Admitting: Family

## 2022-05-16 VITALS — BP 118/78 | HR 85 | Resp 18 | Ht 72.0 in | Wt 312.8 lb

## 2022-05-16 DIAGNOSIS — N189 Chronic kidney disease, unspecified: Secondary | ICD-10-CM

## 2022-05-16 DIAGNOSIS — E1122 Type 2 diabetes mellitus with diabetic chronic kidney disease: Secondary | ICD-10-CM

## 2022-05-16 DIAGNOSIS — I2699 Other pulmonary embolism without acute cor pulmonale: Secondary | ICD-10-CM | POA: Diagnosis not present

## 2022-05-16 DIAGNOSIS — E119 Type 2 diabetes mellitus without complications: Secondary | ICD-10-CM

## 2022-05-16 LAB — COMPREHENSIVE METABOLIC PANEL
ALT: 21 U/L (ref 0–53)
AST: 17 U/L (ref 0–37)
Albumin: 4.5 g/dL (ref 3.5–5.2)
Alkaline Phosphatase: 58 U/L (ref 39–117)
BUN: 11 mg/dL (ref 6–23)
CO2: 30 mEq/L (ref 19–32)
Calcium: 9.7 mg/dL (ref 8.4–10.5)
Chloride: 100 mEq/L (ref 96–112)
Creatinine, Ser: 0.96 mg/dL (ref 0.40–1.50)
GFR: 95.23 mL/min (ref >60.00)
Glucose, Bld: 117 mg/dL — ABNORMAL HIGH (ref 70–99)
Potassium: 4.4 mEq/L (ref 3.5–5.1)
Sodium: 136 mEq/L (ref 135–145)
Total Bilirubin: 0.4 mg/dL (ref 0.2–1.2)
Total Protein: 7 g/dL (ref 6.0–8.3)

## 2022-05-16 LAB — HEMOGLOBIN A1C: Hgb A1c MFr Bld: 7.6 % — ABNORMAL HIGH (ref 4.6–6.5)

## 2022-05-16 MED ORDER — PIOGLITAZONE HCL 30 MG PO TABS: 30.0000 mg | ORAL_TABLET | Freq: Every day | ORAL | 1 refills | Status: DC

## 2022-05-16 MED ORDER — RIVAROXABAN 10 MG PO TABS: 10.0000 mg | ORAL_TABLET | Freq: Every day | ORAL | 3 refills | Status: DC

## 2022-05-16 MED ORDER — DAPAGLIFLOZIN PROPANEDIOL 10 MG PO TABS: 10.0000 mg | ORAL_TABLET | Freq: Every day | ORAL | 1 refills | Status: DC

## 2022-05-16 NOTE — Progress Notes (Signed)
Jordan Cline is a 47 y.o. 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 8 g 2   blood glucose meter kit and supplies KIT Dispense based on patient and insurance preference. Use up to four times daily as directed. (FOR ICD-9 250.00, 250.01). 1 each 0   glucose blood (ACCU-CHEK AVIVA PLUS) test strip USE AS DIRECTED 4 TIMES A DAY 100 each 2   lisinopril (ZESTRIL) 10 MG tablet TAKE 1 TABLET BY MOUTH EVERY DAY 90 tablet 3   metFORMIN (GLUCOPHAGE-XR) 500 MG 24 hr tablet TAKE 2 TABLETS TWICE A DAY 360 tablet 1   rosuvastatin (CRESTOR) 10 MG tablet TAKE 1 TABLET BY MOUTH EVERY DAY 90 tablet 3   tirzepatide (MOUNJARO) 2.5 MG/0.5ML Pen Inject 2.5 mg into the skin once a week. 2 mL 0   dapagliflozin propanediol (FARXIGA) 10 MG TABS tablet Take 1 tablet (10 mg total) by mouth daily. 90 tablet 1   pioglitazone (ACTOS) 30 MG tablet Take 1 tablet (30 mg total) by mouth daily. 90 tablet 1   rivaroxaban (XARELTO) 10 MG TABS tablet Take 1 tablet (10 mg total) by mouth daily. 90 tablet 3   No current facility-administered medications for this visit.    Allergies: Patient has no known allergies.  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  Family History  Problem Relation Age of Onset   Arrhythmia Mother    Breast cancer Mother    Hyperlipidemia Mother    Diabetes Father    Hypertension Father    Cancer Maternal Uncle    Breast cancer Maternal Grandmother    Cancer Maternal Grandfather    Stroke Paternal Grandmother    Colon cancer Neg Hx    Colon polyps Neg Hx    Esophageal cancer Neg Hx    Stomach cancer Neg Hx    Rectal cancer Neg Hx     Social History   Tobacco Use   Smoking status: Former    Packs/day: 0.00    Years: 12.00    Additional pack years: 0.00    Total pack years: 0.00    Types: Cigarettes    Quit date: 02/13/2014    Years since quitting: 8.2   Smokeless tobacco: Never  Substance Use Topics   Alcohol use: Yes    Alcohol/week: 0.0 standard drinks of alcohol  Comment: occasionallly    Subjective:   Follow up on Type 2 Diabetes- has tolerated Mounjaro well; has lost 8 pounds since starting this medication; notes that blood sugars are averaging 130s; has been trying to take his Metformin 2x per day more consistently; Denies any chest pain, shortness of breath, blurred vision or headache   Objective:  Vitals:   05/16/22 0915  BP: 118/78  Pulse: 85  Resp: 18  SpO2: 99%  Weight: (!) 312 lb 12.8 oz (141.9 kg)  Height: 6' (1.829 m)    General: Well developed, well nourished, in no acute distress  Skin : Warm and dry.  Head: Normocephalic and atraumatic  Eyes: Sclera and conjunctiva clear; pupils round and reactive to light; extraocular movements intact  Ears: External normal; canals clear; tympanic membranes normal  Oropharynx: Pink, supple. No  suspicious lesions  Neck: Supple without thyromegaly, adenopathy  Lungs: Respirations unlabored; clear to auscultation bilaterally without wheeze, rales, rhonchi  CVS exam: normal rate and regular rhythm.  Neurologic: Alert and oriented; speech intact; face symmetrical; moves all extremities well; CNII-XII intact without focal deficit   Assessment:  1. Type 2 diabetes mellitus with chronic kidney disease, without long-term current use of insulin, unspecified CKD stage   2. PE (pulmonary thromboembolism)     Plan:  Will update labs today; refills updated; will most likely plan to increase Mounjaro to 5 mg weekly; follow up to be determined; Refill updated x 1 year- chronic medication;   No follow-ups on file.  Orders Placed This Encounter  Procedures   Comp Met (CMET)   Hemoglobin A1c    Requested Prescriptions   Signed Prescriptions Disp Refills   dapagliflozin propanediol (FARXIGA) 10 MG TABS tablet 90 tablet 1    Sig: Take 1 tablet (10 mg total) by mouth daily.   pioglitazone (ACTOS) 30 MG tablet 90 tablet 1    Sig: Take 1 tablet (30 mg total) by mouth daily.   rivaroxaban (XARELTO) 10 MG TABS tablet 90 tablet 3    Sig: Take 1 tablet (10 mg total) by mouth daily.

## 2022-05-17 ENCOUNTER — Other Ambulatory Visit: Payer: Self-pay | Admitting: Family

## 2022-05-17 MED ORDER — TIRZEPATIDE 5 MG/0.5ML ~~LOC~~ SOAJ: 5.0000 mg | SUBCUTANEOUS | 1 refills | Status: DC

## 2022-05-20 ENCOUNTER — Other Ambulatory Visit: Payer: Self-pay | Admitting: Family

## 2022-09-11 ENCOUNTER — Other Ambulatory Visit: Payer: Self-pay | Admitting: Family

## 2022-09-11 DIAGNOSIS — I10 Essential (primary) hypertension: Secondary | ICD-10-CM

## 2022-09-11 DIAGNOSIS — E118 Type 2 diabetes mellitus with unspecified complications: Secondary | ICD-10-CM

## 2022-10-12 ENCOUNTER — Ambulatory Visit (INDEPENDENT_AMBULATORY_CARE_PROVIDER_SITE_OTHER): Payer: 59 | Admitting: Family

## 2022-10-12 ENCOUNTER — Other Ambulatory Visit: Payer: Self-pay | Admitting: Family

## 2022-10-12 ENCOUNTER — Encounter: Payer: Self-pay | Admitting: Family

## 2022-10-12 VITALS — BP 122/68 | HR 90 | Resp 20 | Ht 72.0 in | Wt 298.4 lb

## 2022-10-12 DIAGNOSIS — E1122 Type 2 diabetes mellitus with diabetic chronic kidney disease: Secondary | ICD-10-CM

## 2022-10-12 DIAGNOSIS — E785 Hyperlipidemia, unspecified: Secondary | ICD-10-CM | POA: Diagnosis not present

## 2022-10-12 DIAGNOSIS — Z7984 Long term (current) use of oral hypoglycemic drugs: Secondary | ICD-10-CM | POA: Diagnosis not present

## 2022-10-12 LAB — COMPREHENSIVE METABOLIC PANEL
ALT: 20 U/L (ref 0–53)
AST: 19 U/L (ref 0–37)
Albumin: 4.4 g/dL (ref 3.5–5.2)
Alkaline Phosphatase: 50 U/L (ref 39–117)
BUN: 11 mg/dL (ref 6–23)
CO2: 24 mEq/L (ref 19–32)
Calcium: 9.8 mg/dL (ref 8.4–10.5)
Chloride: 103 mEq/L (ref 96–112)
Creatinine, Ser: 0.98 mg/dL (ref 0.40–1.50)
GFR: 92.64 mL/min (ref >60.00)
Glucose, Bld: 91 mg/dL (ref 70–99)
Potassium: 4.4 mEq/L (ref 3.5–5.1)
Sodium: 137 mEq/L (ref 135–145)
Total Bilirubin: 0.4 mg/dL (ref 0.2–1.2)
Total Protein: 7.1 g/dL (ref 6.0–8.3)

## 2022-10-12 LAB — CBC WITH DIFFERENTIAL/PLATELET
Basophils Absolute: 0 10*3/uL (ref 0.0–0.1)
Basophils Relative: 0.5 % (ref 0.0–3.0)
Eosinophils Absolute: 0.2 10*3/uL (ref 0.0–0.7)
Eosinophils Relative: 4.3 % (ref 0.0–5.0)
HCT: 45.4 % (ref 39.0–52.0)
Hemoglobin: 14.4 g/dL (ref 13.0–17.0)
Lymphocytes Relative: 31.9 % (ref 12.0–46.0)
Lymphs Abs: 1.4 10*3/uL (ref 0.7–4.0)
MCHC: 31.8 g/dL (ref 30.0–36.0)
MCV: 80.3 fl (ref 78.0–100.0)
Monocytes Absolute: 0.4 10*3/uL (ref 0.1–1.0)
Monocytes Relative: 9.9 % (ref 3.0–12.0)
Neutro Abs: 2.4 10*3/uL (ref 1.4–7.7)
Neutrophils Relative %: 53.4 % (ref 43.0–77.0)
Platelets: 371 10*3/uL (ref 150.0–400.0)
RBC: 5.66 Mil/uL (ref 4.22–5.81)
RDW: 17.3 % — ABNORMAL HIGH (ref 11.5–15.5)
WBC: 4.5 10*3/uL (ref 4.0–10.5)

## 2022-10-12 LAB — HEMOGLOBIN A1C: Hgb A1c MFr Bld: 7.2 % — ABNORMAL HIGH (ref 4.6–6.5)

## 2022-10-12 MED ORDER — TIRZEPATIDE 7.5 MG/0.5ML ~~LOC~~ SOAJ: 7.5000 mg | SUBCUTANEOUS | 5 refills | Status: DC

## 2022-10-12 MED ORDER — ROSUVASTATIN CALCIUM 10 MG PO TABS
10.0000 mg | ORAL_TABLET | Freq: Every day | ORAL | 3 refills | Status: DC
Start: 2022-10-12 — End: 2023-10-16

## 2022-10-12 MED ORDER — DAPAGLIFLOZIN PROPANEDIOL 10 MG PO TABS: 10.0000 mg | ORAL_TABLET | Freq: Every day | ORAL | 1 refills | Status: DC

## 2022-10-12 MED ORDER — METFORMIN HCL ER 500 MG PO TB24: ORAL_TABLET | ORAL | 1 refills | Status: DC

## 2022-10-12 NOTE — Progress Notes (Signed)
Jordan Cline is a 47 y.o. 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 8 g 2   blood glucose meter kit and supplies KIT Dispense based on patient and insurance preference. Use up to four times daily as directed. (FOR ICD-9 250.00, 250.01). 1 each 0   glucose blood (ACCU-CHEK AVIVA PLUS) test strip USE AS DIRECTED 4 TIMES A DAY 100 each 2   lisinopril (ZESTRIL) 10 MG tablet TAKE 1 TABLET BY MOUTH EVERY DAY 90 tablet 3   pioglitazone (ACTOS) 30 MG tablet Take 1 tablet (30 mg total) by mouth daily. 90 tablet 1   rivaroxaban (XARELTO) 10 MG TABS tablet Take 1 tablet (10 mg total) by mouth daily. 90 tablet 3   tirzepatide (MOUNJARO) 5 MG/0.5ML Pen Inject 5 mg into the skin once a week. 6 mL 1   dapagliflozin propanediol (FARXIGA) 10 MG TABS tablet Take 1 tablet (10 mg total) by mouth daily. 90 tablet 1   metFORMIN (GLUCOPHAGE-XR) 500 MG 24 hr tablet TAKE 2 TABLETS TWICE A DAY 360 tablet 1   rosuvastatin (CRESTOR) 10 MG tablet Take 1 tablet (10 mg total) by mouth daily. 90 tablet 3   No current facility-administered medications for this visit.    Allergies: Patient has  no known allergies.  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  Family History  Problem Relation Age of Onset   Arrhythmia Mother    Breast cancer Mother    Hyperlipidemia Mother    Diabetes Father    Hypertension Father    Cancer Maternal Uncle    Breast cancer Maternal Grandmother    Cancer Maternal Grandfather    Stroke Paternal Grandmother    Colon cancer Neg Hx    Colon polyps Neg Hx    Esophageal cancer Neg Hx    Stomach cancer Neg Hx    Rectal cancer Neg Hx     Social History   Tobacco Use   Smoking status: Former    Current packs/day: 0.00    Types: Cigarettes    Quit date: 02/13/2002    Years since quitting: 20.6   Smokeless tobacco: Never  Substance Use Topics   Alcohol use: Yes    Alcohol/week: 0.0 standard drinks of alcohol    Comment: occasionallly    Subjective:  Follow up on chronic care needs- has been doing well since changed to [facility]; is currently taking Metformin once per day; in general, feeling very good; does need some refills updated; has lost 14 pounds since April 2024;    Objective:  Vitals:   10/12/22 0920  BP: 122/68  Pulse: 90  Resp: 20  SpO2: 100%  Weight: 298 lb 6.4 oz (135.4 kg)  Height: 6' (1.829 m)    General: Well developed, well nourished, in no acute distress  Skin : Warm and dry.  Head: Normocephalic and atraumatic  Eyes: Sclera and conjunctiva clear; pupils round and reactive to light; extraocular movements intact  Ears: External normal; canals clear; tympanic membranes normal  Oropharynx: Pink, supple. No suspicious lesions  Neck: Supple without thyromegaly, adenopathy  Lungs: Respirations  unlabored; clear to auscultation bilaterally without wheeze, rales, rhonchi  CVS exam: normal rate and regular rhythm.  Neurologic: Alert and oriented; speech intact; face symmetrical; moves all extremities well; CNII-XII intact without focal deficit   Assessment:  1. Type 2 diabetes mellitus with chronic kidney disease, without long-term current use of insulin, unspecified CKD stage (HCC)   2. Hyperlipidemia, unspecified hyperlipidemia type     Plan:  Good response to [facility]; to consider increasing dosage to 7.5- would like to get hgba1c below 7; plan for 6 month follow up; Stable; refill updated;    Return in about 6 months (around 04/13/2023).  Orders Placed This Encounter  Procedures   CBC with Differential/Platelet   Comp Met (CMET)   Hemoglobin A1c    Requested Prescriptions   Signed Prescriptions Disp Refills   dapagliflozin propanediol (FARXIGA) 10 MG TABS tablet 90 tablet 1    Sig: Take 1 tablet (10 mg total) by mouth daily.   metFORMIN (GLUCOPHAGE-XR) 500 MG 24 hr tablet 360 tablet 1    Sig: TAKE 2 TABLETS TWICE A DAY   rosuvastatin (CRESTOR) 10 MG tablet 90 tablet 3    Sig: Take 1 tablet (10 mg total) by mouth daily.

## 2022-10-31 ENCOUNTER — Encounter: Payer: Self-pay | Admitting: Family

## 2022-10-31 ENCOUNTER — Ambulatory Visit (HOSPITAL_BASED_OUTPATIENT_CLINIC_OR_DEPARTMENT_OTHER)
Admission: RE | Admit: 2022-10-31 | Discharge: 2022-10-31 | Disposition: A | Discharge status: Home / Self Care | Payer: 59 | Source: Ambulatory Visit | Attending: Family | Admitting: Family

## 2022-10-31 ENCOUNTER — Ambulatory Visit (INDEPENDENT_AMBULATORY_CARE_PROVIDER_SITE_OTHER): Payer: 59 | Admitting: Family

## 2022-10-31 VITALS — BP 138/74 | HR 60 | Temp 98.9°F | Ht 72.0 in | Wt 292.8 lb

## 2022-10-31 DIAGNOSIS — R053 Chronic cough: Secondary | ICD-10-CM | POA: Diagnosis not present

## 2022-10-31 DIAGNOSIS — Z86711 Personal history of pulmonary embolism: Secondary | ICD-10-CM | POA: Diagnosis not present

## 2022-10-31 DIAGNOSIS — J40 Bronchitis, not specified as acute or chronic: Secondary | ICD-10-CM | POA: Diagnosis not present

## 2022-10-31 MED ORDER — ALBUTEROL SULFATE HFA 108 (90 BASE) MCG/ACT IN AERS: 2.0000 | INHALATION_SPRAY | Freq: Four times a day (QID) | RESPIRATORY_TRACT | 2 refills | Status: AC | PRN

## 2022-10-31 MED ORDER — DOXYCYCLINE HYCLATE 100 MG PO TABS: 100.0000 mg | ORAL_TABLET | Freq: Two times a day (BID) | ORAL | 0 refills | Status: DC

## 2022-10-31 MED ORDER — PREDNISONE 20 MG PO TABS: 20.0000 mg | ORAL_TABLET | Freq: Every day | ORAL | 0 refills | Status: DC

## 2022-10-31 NOTE — Progress Notes (Unsigned)
Jordan Cline is a 47 y.o. 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 Use up to four times daily as directed. (FOR ICD-9 250.00, 250.01). 1 each 0   dapagliflozin propanediol (FARXIGA) 10 MG TABS tablet Take 1 tablet (10 mg total) by mouth daily. 90 tablet 1   doxycycline (VIBRA-TABS) 100 MG tablet Take 1 tablet (100 mg total) by mouth 2 (two) times daily. 14 tablet 0   glucose blood (ACCU-CHEK AVIVA PLUS) test strip USE AS DIRECTED 4 TIMES A DAY 100 each 2   lisinopril (ZESTRIL) 10 MG tablet TAKE 1 TABLET BY MOUTH EVERY DAY 90 tablet 3   metFORMIN (GLUCOPHAGE-XR) 500 MG 24 hr tablet TAKE 2 TABLETS TWICE A DAY 360 tablet 1   pioglitazone (ACTOS) 30 MG tablet Take 1 tablet (30 mg total) by mouth daily. 90 tablet 1   predniSONE (DELTASONE) 20 MG tablet Take 1 tablet (20 mg total) by mouth daily with breakfast. 5 tablet 0   rivaroxaban (XARELTO) 10 MG TABS tablet Take 1 tablet (10 mg total) by mouth daily. 90 tablet 3   rosuvastatin (CRESTOR) 10 MG tablet Take 1 tablet (10 mg total) by mouth daily. 90 tablet 3   tirzepatide (MOUNJARO) 7.5 MG/0.5ML Pen Inject 7.5 mg into the skin once a week. 2 mL 5   albuterol (VENTOLIN HFA)  108 (90 Base) MCG/ACT inhaler Inhale 2 puffs into the lungs every 6 (six) hours as needed for wheezing or shortness of breath. 8 g 2   No current facility-administered medications for this visit.    Allergies: Patient has no known allergies.  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  Family History  Problem Relation Age of Onset   Arrhythmia Mother    Breast cancer Mother    Hyperlipidemia Mother    Diabetes Father    Hypertension Father    Cancer Maternal Uncle    Breast cancer Maternal Grandmother    Cancer Maternal Grandfather    Stroke Paternal Grandmother    Colon cancer Neg Hx    Colon polyps Neg Hx    Esophageal cancer Neg Hx    Stomach cancer Neg Hx    Rectal cancer Neg Hx     Social History   Tobacco Use   Smoking status: Former    Current packs/day: 0.00    Types: Cigarettes  Quit date: 02/13/2002    Years since quitting: 20.7   Smokeless tobacco: Never  Substance Use Topics   Alcohol use: Yes    Alcohol/week: 0.0 standard drinks of alcohol    Comment: occasionallly    Subjective:   Cough/ congestion x 11 days; feeling "labored breathing" more noticeable at night; is prone to recurrent bronchitis; no fever; notes that has noticed some improvement in the past 24 hours;   Objective:  Vitals:   10/31/22 1545  BP: 138/74  Pulse: 60  Temp: 98.9 F (37.2 C)  TempSrc: Oral  SpO2: 97%  Weight: 292 lb 12.8 oz (132.8 kg)  Height: 6' (1.829 m)    General: Well developed, well nourished, in no acute distress  Skin : Warm and dry.  Head: Normocephalic and atraumatic  Eyes: Sclera and conjunctiva clear; pupils round and reactive to  light; extraocular movements intact  Ears: External normal; canals clear; tympanic membranes normal  Oropharynx: Pink, supple. No suspicious lesions  Neck: Supple without thyromegaly, adenopathy  Lungs: Respirations unlabored; intermittent wheeze noted CVS exam: normal rate and regular rhythm.  Abdomen: Soft; nontender; nondistended; normoactive bowel sounds; no masses or hepatosplenomegaly  Musculoskeletal: No deformities; no active joint inflammation  Extremities: No edema, cyanosis, clubbing  Vessels: Symmetric bilaterally  Neurologic: Alert and oriented; speech intact; face symmetrical; moves all extremities well; CNII-XII intact without focal deficit   Assessment:  1. Persistent cough   2. History of pulmonary embolus (PE)     Plan:   Suspect bronchitis but due to patient's history of PE, will update labs and CXR; Rx for Doxycycline and Prendisone; refill updated for albuterol as well; follow up to be determined.   No follow-ups on file.  Orders Placed This Encounter  Procedures   DG Chest 2 View    Standing Status:   Future    Number of Occurrences:   1    Standing Expiration Date:   10/31/2023    Order Specific Question:   Reason for Exam (SYMPTOM  OR DIAGNOSIS REQUIRED)    Answer:   persitent cough    Order Specific Question:   Preferred imaging location?    Answer:   [facility]   CBC with Differential/Platelet   D-Dimer, Quantitative    Requested Prescriptions   Signed Prescriptions Disp Refills   doxycycline (VIBRA-TABS) 100 MG tablet 14 tablet 0    Sig: Take 1 tablet (100 mg total) by mouth 2 (two) times daily.   predniSONE (DELTASONE) 20 MG tablet 5 tablet 0    Sig: Take 1 tablet (20 mg total) by mouth daily with breakfast.   albuterol (VENTOLIN HFA) 108 (90 Base) MCG/ACT inhaler 8 g 2    Sig: Inhale 2 puffs into the lungs every 6 (six) hours as needed for wheezing or shortness of breath.

## 2023-01-04 DIAGNOSIS — H524 Presbyopia: Secondary | ICD-10-CM | POA: Diagnosis not present

## 2023-01-04 DIAGNOSIS — E119 Type 2 diabetes mellitus without complications: Secondary | ICD-10-CM | POA: Diagnosis not present

## 2023-02-23 ENCOUNTER — Other Ambulatory Visit: Payer: Self-pay | Admitting: Family

## 2023-03-27 ENCOUNTER — Ambulatory Visit: Payer: 59 | Admitting: Family

## 2023-03-27 ENCOUNTER — Encounter: Payer: Self-pay | Admitting: Family

## 2023-03-27 VITALS — BP 136/74 | HR 78 | Ht 72.0 in | Wt 293.6 lb

## 2023-03-27 DIAGNOSIS — Z7985 Long-term (current) use of injectable non-insulin antidiabetic drugs: Secondary | ICD-10-CM

## 2023-03-27 DIAGNOSIS — E119 Type 2 diabetes mellitus without complications: Secondary | ICD-10-CM

## 2023-03-27 DIAGNOSIS — E1122 Type 2 diabetes mellitus with diabetic chronic kidney disease: Secondary | ICD-10-CM | POA: Diagnosis not present

## 2023-03-27 LAB — COMPREHENSIVE METABOLIC PANEL
ALT: 22 U/L (ref 0–53)
AST: 19 U/L (ref 0–37)
Albumin: 4.3 g/dL (ref 3.5–5.2)
Alkaline Phosphatase: 56 U/L (ref 39–117)
BUN: 8 mg/dL (ref 6–23)
CO2: 28 meq/L (ref 19–32)
Calcium: 9.1 mg/dL (ref 8.4–10.5)
Chloride: 103 meq/L (ref 96–112)
Creatinine, Ser: 0.85 mg/dL (ref 0.40–1.50)
GFR: 104.06 mL/min (ref >60.00)
Glucose, Bld: 90 mg/dL (ref 70–99)
Potassium: 4 meq/L (ref 3.5–5.1)
Sodium: 139 meq/L (ref 135–145)
Total Bilirubin: 0.3 mg/dL (ref 0.2–1.2)
Total Protein: 7 g/dL (ref 6.0–8.3)

## 2023-03-27 LAB — CBC WITH DIFFERENTIAL/PLATELET
Basophils Absolute: 0 10*3/uL (ref 0.0–0.1)
Basophils Relative: 0.7 % (ref 0.0–3.0)
Eosinophils Absolute: 0.1 10*3/uL (ref 0.0–0.7)
Eosinophils Relative: 2.4 % (ref 0.0–5.0)
HCT: 44.6 % (ref 39.0–52.0)
Hemoglobin: 14.7 g/dL (ref 13.0–17.0)
Lymphocytes Relative: 26.1 % (ref 12.0–46.0)
Lymphs Abs: 1.1 10*3/uL (ref 0.7–4.0)
MCHC: 33 g/dL (ref 30.0–36.0)
MCV: 78.6 fL (ref 78.0–100.0)
Monocytes Absolute: 0.5 10*3/uL (ref 0.1–1.0)
Monocytes Relative: 12 % (ref 3.0–12.0)
Neutro Abs: 2.6 10*3/uL (ref 1.4–7.7)
Neutrophils Relative %: 58.8 % (ref 43.0–77.0)
Platelets: 293 10*3/uL (ref 150.0–400.0)
RBC: 5.67 Mil/uL (ref 4.22–5.81)
RDW: 18.9 % — ABNORMAL HIGH (ref 11.5–15.5)
WBC: 4.4 10*3/uL (ref 4.0–10.5)

## 2023-03-27 NOTE — Progress Notes (Signed)
Jordan Cline is a 48 y.o. 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 8 g 2   blood glucose meter kit and supplies KIT Dispense based on patient and insurance preference. Use up to four times daily as directed. (FOR ICD-9 250.00, 250.01). 1 each 0   dapagliflozin propanediol (FARXIGA) 10 MG TABS tablet Take 1 tablet (10 mg total) by mouth daily. 90 tablet 1   glucose blood (ACCU-CHEK AVIVA PLUS) test strip USE AS DIRECTED 4 TIMES A DAY 100 each 2   lisinopril (ZESTRIL) 10 MG tablet TAKE 1 TABLET BY MOUTH EVERY DAY 90 tablet 3   metFORMIN (GLUCOPHAGE-XR) 500 MG 24 hr tablet TAKE 2 TABLETS TWICE A DAY 360 tablet 1   pioglitazone (ACTOS) 30 MG tablet Take 1 tablet (30 mg total) by mouth daily. 90 tablet 1   rivaroxaban (XARELTO) 10 MG TABS tablet Take 1 tablet (10 mg total) by mouth daily. 90 tablet 3   rosuvastatin (CRESTOR) 10 MG tablet Take 1 tablet (10 mg total) by mouth daily. 90 tablet 3   tirzepatide (MOUNJARO) 7.5 MG/0.5ML Pen Inject 7.5 mg into the skin once a week. 2 mL 5   No current facility-administered medications for this visit.    Allergies: Patient  has no known allergies.  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  Family History  Problem Relation Age of Onset   Arrhythmia Mother    Breast cancer Mother    Hyperlipidemia Mother    Diabetes Father    Hypertension Father    Cancer Maternal Uncle    Breast cancer Maternal Grandmother    Cancer Maternal Grandfather    Stroke Paternal Grandmother    Colon cancer Neg Hx    Colon polyps Neg Hx    Esophageal cancer Neg Hx    Stomach cancer Neg Hx    Rectal cancer Neg Hx     Social History   Tobacco Use   Smoking status: Former    Current packs/day: 0.00    Types: Cigarettes    Quit date: 02/13/2002    Years since quitting: 21.1   Smokeless tobacco: Never  Substance Use Topics   Alcohol use: Yes    Alcohol/week: 0.0 standard drinks of alcohol    Comment: occasionallly    Subjective:  4 month follow up on Type 2 Diabetes; no acute concerns; has tolerated Mounjaro with no concerns; would like to get labs updated today;    Objective:  Vitals:   03/27/23 1031  BP: 136/74  Pulse: 78  SpO2: 98%  Weight: 293 lb 9.6 oz (133.2 kg)  Height: 6' (1.829 m)    General: Well developed, well nourished, in no acute distress  Skin : Warm and dry.  Head: Normocephalic and atraumatic  Eyes: Sclera and conjunctiva clear; pupils round and reactive to light; extraocular movements intact  Ears: External normal; canals clear; tympanic membranes normal  Oropharynx: Pink, supple. No suspicious lesions  Neck: Supple without thyromegaly, adenopathy  Lungs: Respirations unlabored; clear to auscultation bilaterally without wheeze, rales, rhonchi  CVS exam: normal rate  and regular rhythm.  Neurologic: Alert and oriented; speech intact; face symmetrical; moves all extremities well; CNII-XII intact without focal deficit   Assessment:  1. Type 2 diabetes mellitus with chronic kidney disease, without long-term current use of insulin, unspecified CKD stage (HCC)   2. Diabetes mellitus treated with injections of non-insulin medication (HCC)     Plan:  No acute concerns today; update labs today; goal is to get Hgba1c below 7- will adjust medications as needed; follow up in 4-6 months;   No follow-ups on file.  Orders Placed This Encounter  Procedures   CBC with Differential/Platelet   Comp Met (CMET)   Hemoglobin A1c    Requested Prescriptions    No prescriptions requested or ordered in this encounter

## 2023-03-28 ENCOUNTER — Other Ambulatory Visit: Payer: Self-pay | Admitting: Family

## 2023-03-28 ENCOUNTER — Encounter: Payer: Self-pay | Admitting: Family

## 2023-03-28 LAB — HEMOGLOBIN A1C: Hgb A1c MFr Bld: 6.3 % (ref 4.6–6.5)

## 2023-03-28 MED ORDER — TIRZEPATIDE 7.5 MG/0.5ML ~~LOC~~ SOAJ: 7.5000 mg | SUBCUTANEOUS | 5 refills | Status: DC

## 2023-04-04 ENCOUNTER — Encounter (HOSPITAL_BASED_OUTPATIENT_CLINIC_OR_DEPARTMENT_OTHER): Payer: Self-pay | Admitting: Pulmonary Disease

## 2023-04-11 ENCOUNTER — Other Ambulatory Visit: Payer: Self-pay | Admitting: Family

## 2023-05-25 ENCOUNTER — Other Ambulatory Visit: Payer: Self-pay | Admitting: Family

## 2023-05-25 DIAGNOSIS — I2699 Other pulmonary embolism without acute cor pulmonale: Secondary | ICD-10-CM

## 2023-07-11 ENCOUNTER — Other Ambulatory Visit: Payer: Self-pay | Admitting: Family

## 2023-07-11 DIAGNOSIS — E118 Type 2 diabetes mellitus with unspecified complications: Secondary | ICD-10-CM

## 2023-07-11 DIAGNOSIS — I10 Essential (primary) hypertension: Secondary | ICD-10-CM

## 2023-07-13 ENCOUNTER — Ambulatory Visit (INDEPENDENT_AMBULATORY_CARE_PROVIDER_SITE_OTHER): Admitting: Family

## 2023-07-13 ENCOUNTER — Encounter: Payer: Self-pay | Admitting: Family

## 2023-07-13 VITALS — BP 112/70 | HR 85 | Ht 72.0 in | Wt 295.6 lb

## 2023-07-13 DIAGNOSIS — Z7985 Long-term (current) use of injectable non-insulin antidiabetic drugs: Secondary | ICD-10-CM

## 2023-07-13 DIAGNOSIS — E1169 Type 2 diabetes mellitus with other specified complication: Secondary | ICD-10-CM | POA: Diagnosis not present

## 2023-07-13 LAB — MICROALBUMIN / CREATININE URINE RATIO
Creatinine,U: 65.6 mg/dL
Microalb Creat Ratio: 11.4 mg/g (ref 0.0–30.0)
Microalb, Ur: 0.8 mg/dL (ref 0.0–1.9)

## 2023-07-13 LAB — CBC WITH DIFFERENTIAL/PLATELET
Basophils Absolute: 0 10*3/uL (ref 0.0–0.1)
Basophils Relative: 0.4 % (ref 0.0–3.0)
Eosinophils Absolute: 0.1 10*3/uL (ref 0.0–0.7)
Eosinophils Relative: 2.3 % (ref 0.0–5.0)
HCT: 46.8 % (ref 39.0–52.0)
Hemoglobin: 15.3 g/dL (ref 13.0–17.0)
Lymphocytes Relative: 31.2 % (ref 12.0–46.0)
Lymphs Abs: 1.5 10*3/uL (ref 0.7–4.0)
MCHC: 32.8 g/dL (ref 30.0–36.0)
MCV: 79.5 fl (ref 78.0–100.0)
Monocytes Absolute: 0.4 10*3/uL (ref 0.1–1.0)
Monocytes Relative: 8.1 % (ref 3.0–12.0)
Neutro Abs: 2.8 10*3/uL (ref 1.4–7.7)
Neutrophils Relative %: 58 % (ref 43.0–77.0)
Platelets: 302 10*3/uL (ref 150.0–400.0)
RBC: 5.88 Mil/uL — ABNORMAL HIGH (ref 4.22–5.81)
RDW: 18 % — ABNORMAL HIGH (ref 11.5–15.5)
WBC: 4.8 10*3/uL (ref 4.0–10.5)

## 2023-07-13 LAB — COMPREHENSIVE METABOLIC PANEL WITH GFR
ALT: 20 U/L (ref 0–53)
AST: 19 U/L (ref 0–37)
Albumin: 4.5 g/dL (ref 3.5–5.2)
Alkaline Phosphatase: 57 U/L (ref 39–117)
BUN: 9 mg/dL (ref 6–23)
CO2: 26 meq/L (ref 19–32)
Calcium: 9.6 mg/dL (ref 8.4–10.5)
Chloride: 101 meq/L (ref 96–112)
Creatinine, Ser: 1.01 mg/dL (ref 0.40–1.50)
GFR: 88.88 mL/min (ref >60.00)
Glucose, Bld: 89 mg/dL (ref 70–99)
Potassium: 4.4 meq/L (ref 3.5–5.1)
Sodium: 137 meq/L (ref 135–145)
Total Bilirubin: 0.5 mg/dL (ref 0.2–1.2)
Total Protein: 7.2 g/dL (ref 6.0–8.3)

## 2023-07-13 LAB — HEMOGLOBIN A1C: Hgb A1c MFr Bld: 6.2 % (ref 4.6–6.5)

## 2023-07-13 NOTE — Progress Notes (Signed)
 Jordan Cline is a 48 y.o. 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 8 g 2   blood glucose meter kit and supplies KIT Dispense based on patient and insurance preference. Use up to four times daily as directed. (FOR ICD-9 250.00, 250.01). 1 each 0   dapagliflozin  propanediol (FARXIGA ) 10 MG TABS tablet Take 1 tablet (10 mg total) by mouth daily. 90 tablet 1   glucose blood (ACCU-CHEK AVIVA PLUS) test strip USE AS DIRECTED 4 TIMES A DAY 100 each 2   lisinopril  (ZESTRIL ) 10 MG tablet Take 1 tablet (10 mg total) by mouth daily. 90 tablet 0   metFORMIN  (GLUCOPHAGE -XR) 500 MG 24 hr tablet TAKE 2 TABLETS BY MOUTH TWICE A DAY 360 tablet 1   pioglitazone  (ACTOS ) 30 MG tablet Take 1 tablet (30 mg total) by mouth daily. 90 tablet 1   rosuvastatin  (CRESTOR ) 10 MG tablet Take 1 tablet (10 mg total) by mouth daily. 90 tablet 3   tirzepatide  (MOUNJARO ) 7.5 MG/0.5ML Pen Inject 7.5 mg into the skin once a week. 2 mL 5   XARELTO  10 MG TABS tablet TAKE 1 TABLET BY MOUTH EVERY DAY. *INS ONLY COVERS 30 DS* 30 tablet 11   No current facility-administered medications for this visit.     Allergies: Patient has no known allergies.  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  Family History  Problem Relation Age of Onset   Arrhythmia Mother    Breast cancer Mother    Hyperlipidemia Mother    Diabetes Father    Hypertension Father    Cancer Maternal Uncle    Breast cancer Maternal Grandmother    Cancer Maternal Grandfather    Stroke Paternal Grandmother    Colon cancer Neg Hx    Colon polyps Neg Hx    Esophageal cancer Neg Hx    Stomach cancer Neg Hx    Rectal cancer Neg Hx     Social History   Tobacco Use   Smoking status: Former    Current packs/day: 0.00    Types: Cigarettes    Quit date: 02/13/2002    Years since quitting: 21.4   Smokeless tobacco: Never  Substance Use Topics   Alcohol use: Yes    Alcohol/week: 0.0 standard drinks of alcohol    Comment:  occasionallly    Subjective:   3 month follow up on Type 2 Diabetes; no acute concerns; is doing well on Mounjaro - no GI side effects; last Hgba1c was at 6.3; Denies any chest pain, shortness of breath, blurred vision or headache   Objective:  Vitals:   07/13/23 1120  BP: 112/70  Pulse: 85  SpO2: 96%  Weight: 295 lb 9.6 oz (134.1 kg)  Height: 6' (1.829 m)    General: Well developed, well nourished, in no acute distress  Skin : Warm and dry.  Head: Normocephalic and atraumatic  Eyes: Sclera and conjunctiva clear; pupils round and reactive to light; extraocular movements intact  Ears: External normal; canals clear; tympanic membranes normal  Oropharynx: Pink, supple. No suspicious lesions  Neck: Supple without thyromegaly, adenopathy  Lungs: Respirations unlabored; clear  to auscultation bilaterally without wheeze, rales, rhonchi  CVS exam: normal rate and regular rhythm.  Neurologic: Alert and oriented; speech intact; face symmetrical; moves all extremities well; CNII-XII intact without focal deficit   Assessment:  1. Type 2 diabetes mellitus with other specified complication, without long-term current use of insulin  (HCC)     Plan:  Will update labs today; if Hgba1c remains stable, continue current dosage of Mounjaro ; congratulated patient on hard work and commitment to his health; follow up in 4 months, sooner prn.   No follow-ups on file.  Orders Placed This Encounter  Procedures   CBC with Differential/Platelet   Comp Met (CMET)   Hemoglobin A1c   Urine Microalbumin w/creat. ratio    Requested Prescriptions    No prescriptions requested or ordered in this encounter

## 2023-07-16 ENCOUNTER — Ambulatory Visit: Payer: Self-pay | Admitting: Family

## 2023-08-24 ENCOUNTER — Other Ambulatory Visit: Payer: Self-pay | Admitting: Family

## 2023-08-29 ENCOUNTER — Encounter: Payer: Self-pay | Admitting: Family

## 2023-09-17 ENCOUNTER — Other Ambulatory Visit: Payer: Self-pay | Admitting: Family

## 2023-10-09 ENCOUNTER — Other Ambulatory Visit: Payer: Self-pay | Admitting: Family

## 2023-10-15 ENCOUNTER — Other Ambulatory Visit: Payer: Self-pay | Admitting: Family

## 2023-10-15 DIAGNOSIS — I10 Essential (primary) hypertension: Secondary | ICD-10-CM

## 2023-10-15 DIAGNOSIS — E118 Type 2 diabetes mellitus with unspecified complications: Secondary | ICD-10-CM

## 2023-10-15 DIAGNOSIS — E785 Hyperlipidemia, unspecified: Secondary | ICD-10-CM

## 2023-10-23 IMAGING — DX DG CHEST 2V
2 series · 2 of 2 positions shown · non-contrast
Comparison: 03/20/2014

CLINICAL DATA: Atypical chest pain, former smoker

EXAM:
CHEST - 2 VIEW

[chest pa]
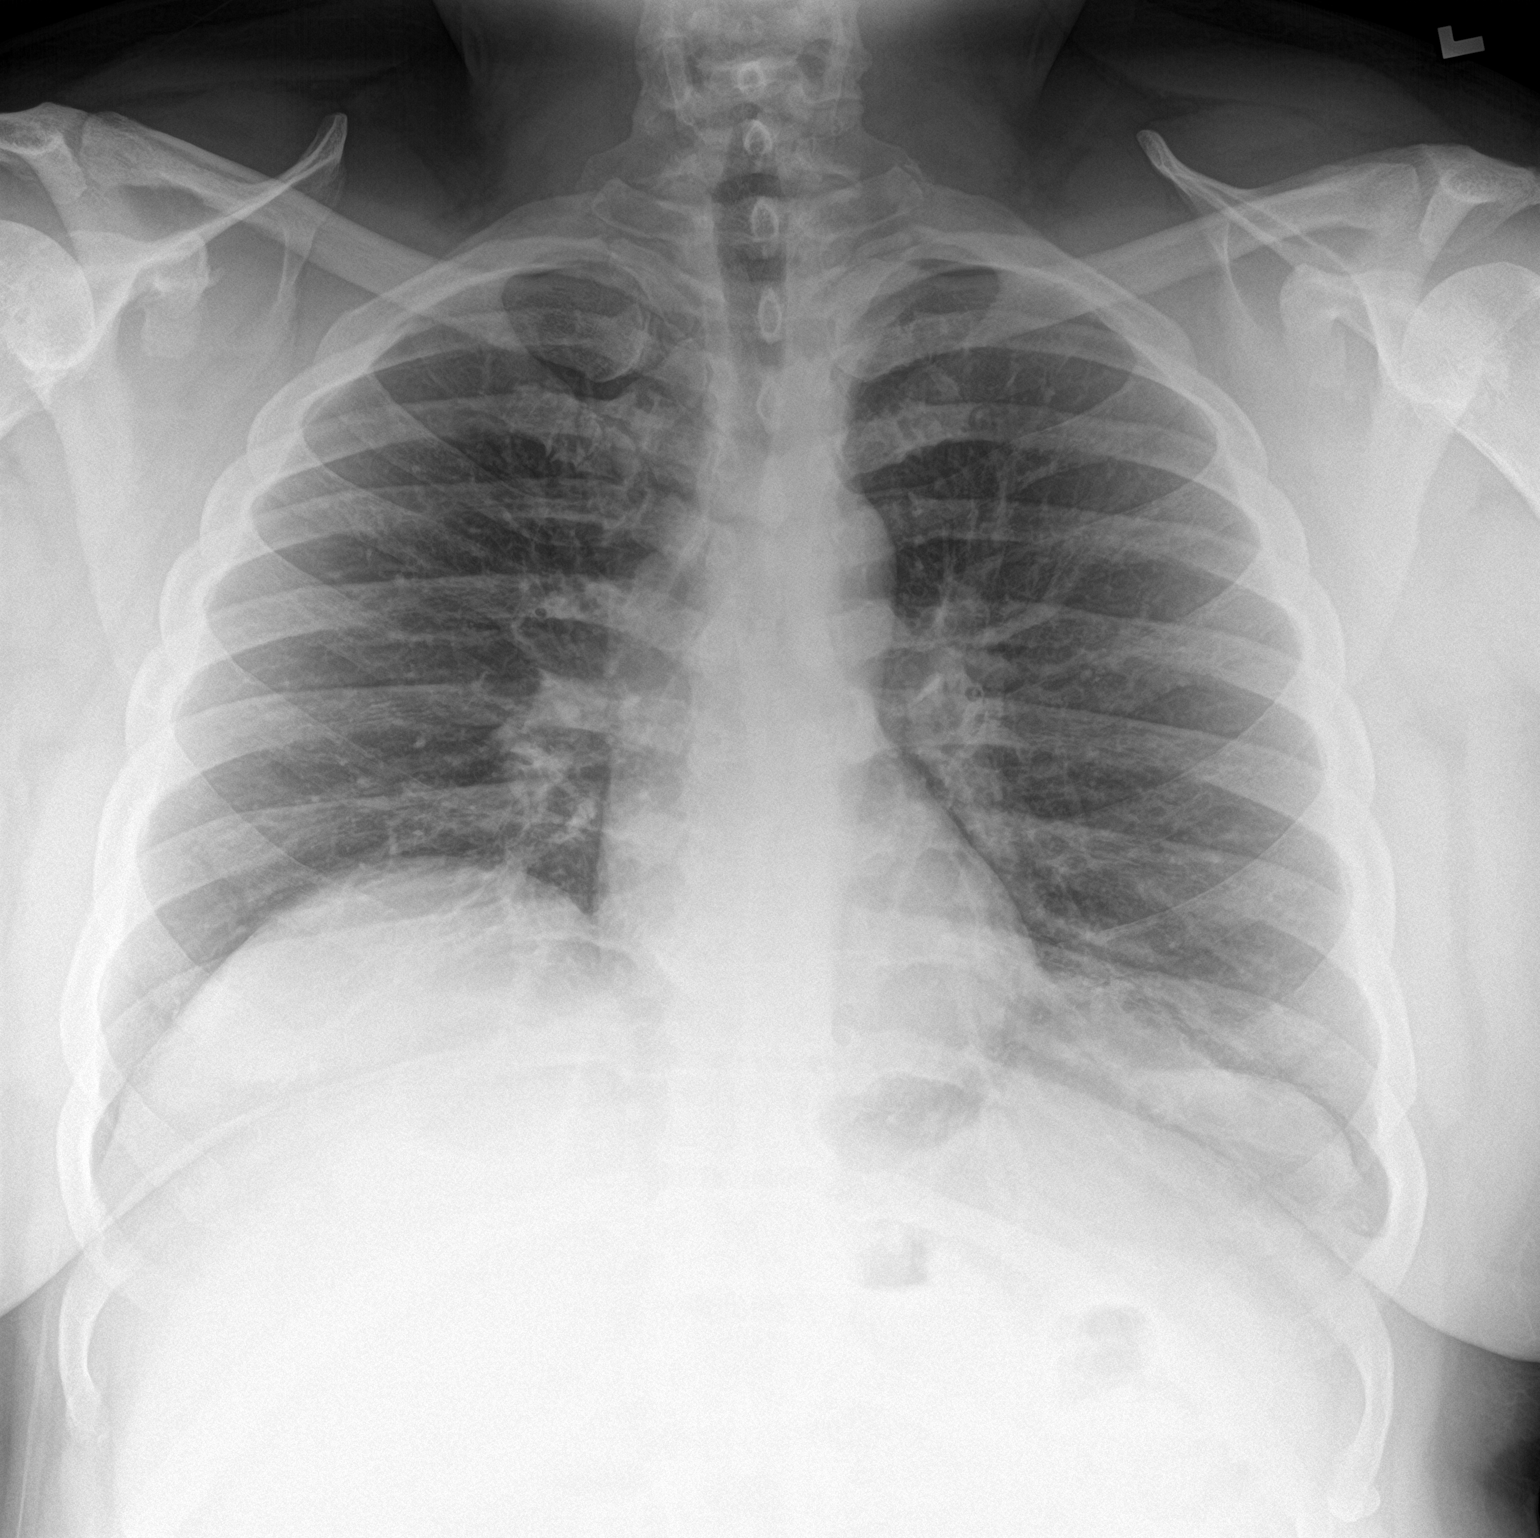

[chest lat]
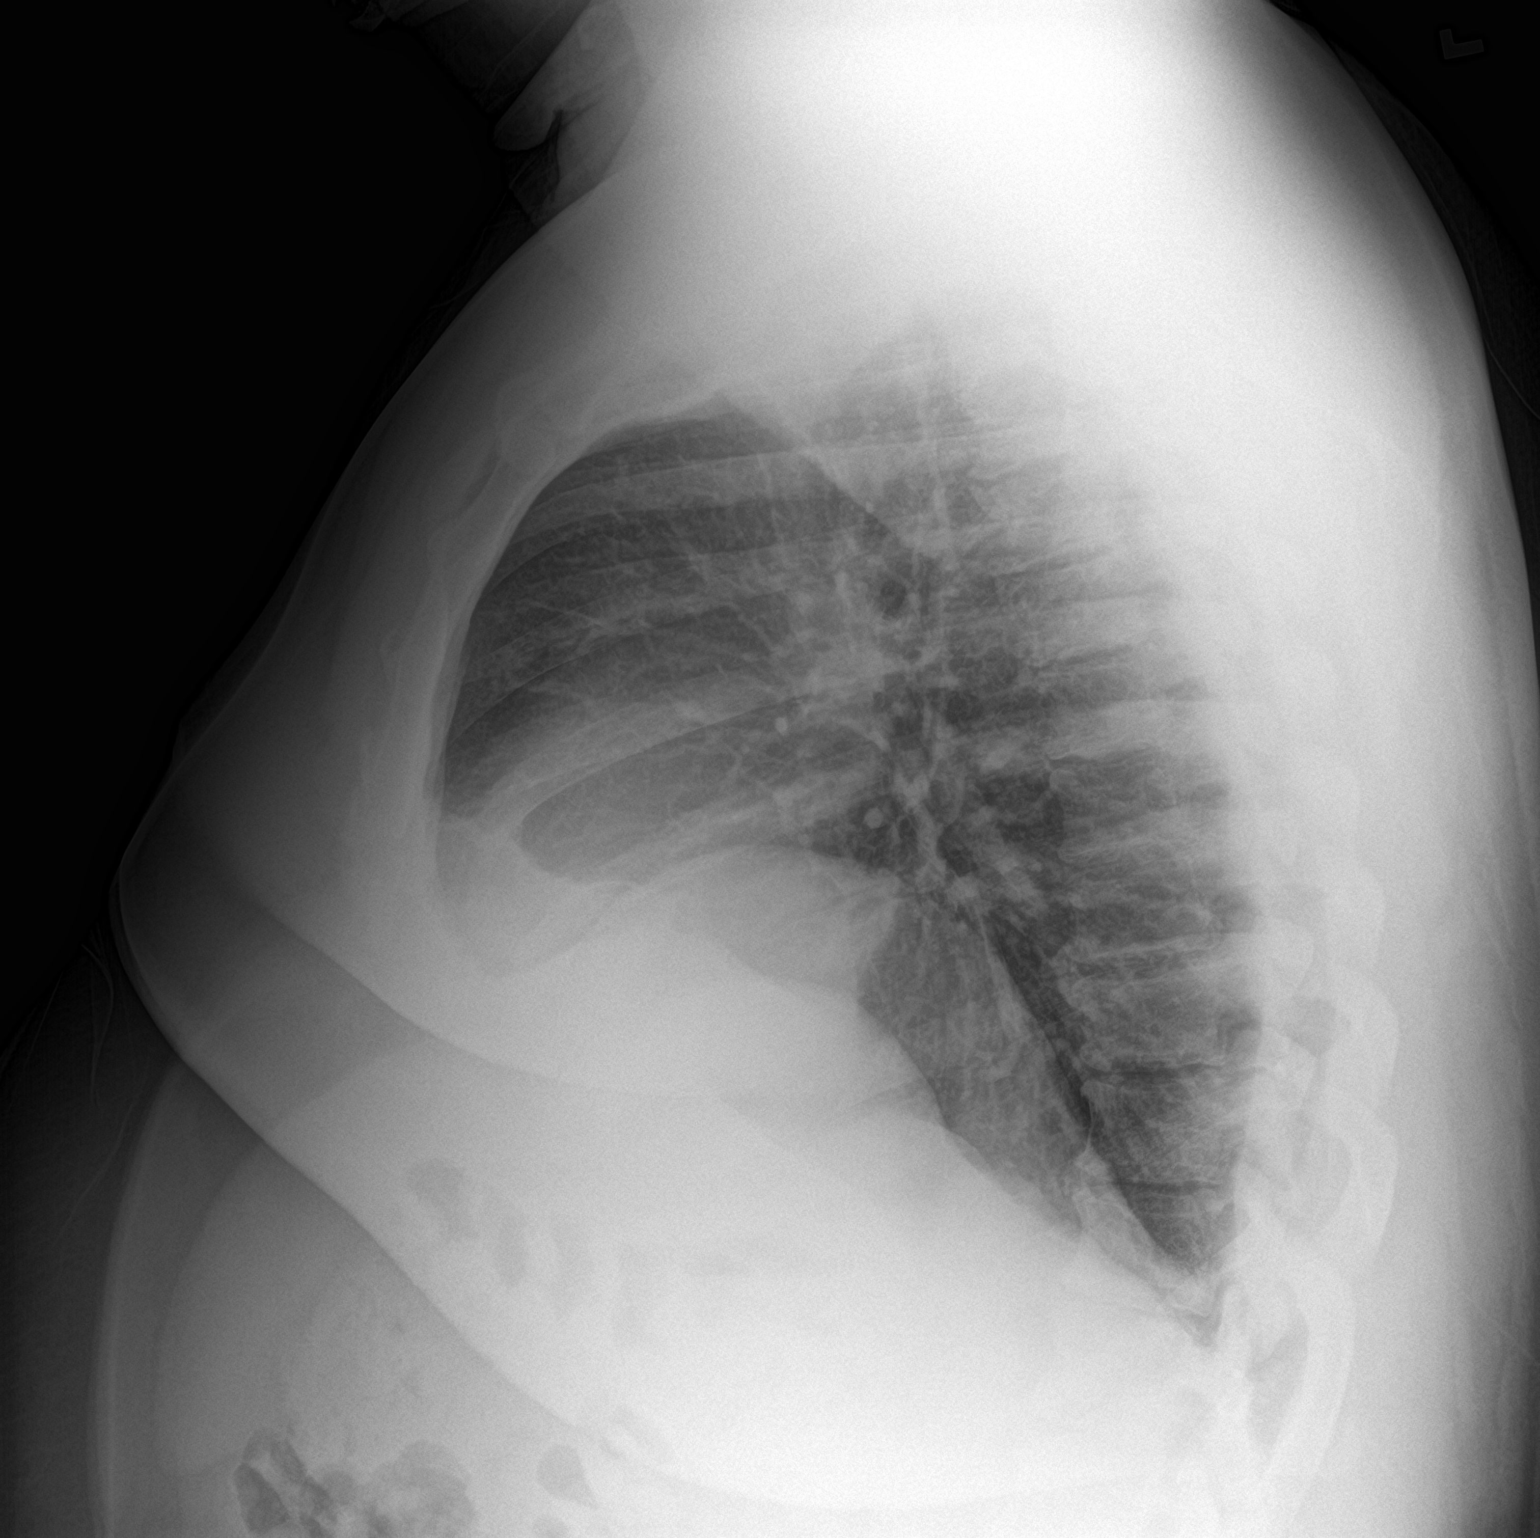

[2 of 2 positions shown; findings below may reference images not displayed]

FINDINGS: Normal heart size, mediastinal contours, and pulmonary vascularity.

Mild LEFT basilar opacity, significantly less than seen on the
previous exam.

Remaining lungs clear.

No pleural effusion or pneumothorax.

Bones unremarkable.
IMPRESSION: Residual opacity at LEFT lung base, question chronic atelectasis,
scarring, or recurrent infiltrate.

## 2024-01-11 ENCOUNTER — Ambulatory Visit (HOSPITAL_COMMUNITY)
Admission: EM | Admit: 2024-01-11 | Discharge: 2024-01-11 | Disposition: A | Discharge status: Home / Self Care | Attending: Physician Assistant | Admitting: Physician Assistant

## 2024-01-11 ENCOUNTER — Ambulatory Visit (INDEPENDENT_AMBULATORY_CARE_PROVIDER_SITE_OTHER)

## 2024-01-11 ENCOUNTER — Encounter (HOSPITAL_COMMUNITY): Payer: Self-pay | Admitting: *Deleted

## 2024-01-11 DIAGNOSIS — R310 Gross hematuria: Secondary | ICD-10-CM

## 2024-01-11 LAB — POCT URINE DIPSTICK
Bilirubin, UA: NEGATIVE
Glucose, UA: >=1000 mg/dL — AB
Ketones, POC UA: NEGATIVE mg/dL
Nitrite, UA: POSITIVE — AB
POC PROTEIN,UA: 30 — AB
Spec Grav, UA: 1.015 (ref 1.010–1.025)
Urobilinogen, UA: 1 U/dL
pH, UA: 6.5 (ref 5.0–8.0)

## 2024-01-11 LAB — COMPREHENSIVE METABOLIC PANEL WITH GFR
ALT: 33 U/L (ref 0–44)
AST: 36 U/L (ref 15–41)
Albumin: 4 g/dL (ref 3.5–5.0)
Alkaline Phosphatase: 59 U/L (ref 38–126)
Anion gap: 13 (ref 5–15)
BUN: 9 mg/dL (ref 6–20)
CO2: 24 mmol/L (ref 22–32)
Calcium: 9.2 mg/dL (ref 8.9–10.3)
Chloride: 100 mmol/L (ref 98–111)
Creatinine, Ser: 1.06 mg/dL (ref 0.61–1.24)
GFR, Estimated: >60 mL/min (ref >60)
Glucose, Bld: 75 mg/dL (ref 70–99)
Potassium: 4.3 mmol/L (ref 3.5–5.1)
Sodium: 137 mmol/L (ref 135–145)
Total Bilirubin: 0.6 mg/dL (ref 0.0–1.2)
Total Protein: 7.1 g/dL (ref 6.5–8.1)

## 2024-01-11 LAB — CBC WITH DIFFERENTIAL/PLATELET
Abs Immature Granulocytes: 0.03 K/uL (ref 0.00–0.07)
Basophils Absolute: 0 K/uL (ref 0.0–0.1)
Basophils Relative: 0 %
Eosinophils Absolute: 0.2 K/uL (ref 0.0–0.5)
Eosinophils Relative: 2 %
HCT: 46.9 % (ref 39.0–52.0)
Hemoglobin: 15.5 g/dL (ref 13.0–17.0)
Immature Granulocytes: 0 %
Lymphocytes Relative: 17 %
Lymphs Abs: 1.6 K/uL (ref 0.7–4.0)
MCH: 26.7 pg (ref 26.0–34.0)
MCHC: 33 g/dL (ref 30.0–36.0)
MCV: 80.7 fL (ref 80.0–100.0)
Monocytes Absolute: 0.7 K/uL (ref 0.1–1.0)
Monocytes Relative: 7 %
Neutro Abs: 6.8 K/uL (ref 1.7–7.7)
Neutrophils Relative %: 74 %
Platelets: 292 K/uL (ref 150–400)
RBC: 5.81 MIL/uL (ref 4.22–5.81)
RDW: 15.8 % — ABNORMAL HIGH (ref 11.5–15.5)
WBC: 9.2 K/uL (ref 4.0–10.5)
nRBC: 0 % (ref 0.0–0.2)

## 2024-01-11 LAB — PROTIME-INR
INR: 1.1 (ref 0.8–1.2)
Prothrombin Time: 14.6 s (ref 11.4–15.2)

## 2024-01-11 LAB — APTT: aPTT: 28 s (ref 24–36)

## 2024-01-11 LAB — GLUCOSE, POCT (MANUAL RESULT ENTRY): POCT Glucose (KUC): 102 mg/dL — AB (ref 70–99)

## 2024-01-11 MED ORDER — SULFAMETHOXAZOLE-TRIMETHOPRIM 800-160 MG PO TABS: 1.0000 | ORAL_TABLET | Freq: Two times a day (BID) | ORAL | 0 refills | Status: AC

## 2024-01-11 NOTE — ED Triage Notes (Signed)
 Pt reports he saw blood in his urine earlier today. First episode was light. Second episode had more blood. Denies pain, other than a little discomfort when I pee. States he takes Xarelto .

## 2024-01-11 NOTE — ED Provider Notes (Signed)
 [facility]    CSN: [id number] Arrival date & time: 01/11/24  1323      History   Chief Complaint Chief Complaint  Patient presents with   Hematuria    HPI Jordan Cline is a 48 y.o. male.   Patient presents today with a several hour history of gross hematuria.  He reports that initially this was a light pink color but then became bright red.  He has had some urinary frequency but denies additional symptoms including dysuria, urgency, abdominal pain, flank pain, fever, nausea, vomiting.  He does have a history of diabetes and takes Farxiga  but denies history of recurrent urinary tract infections.  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  He denies history of nephrolithiasis, single kidney, seeing a urologist.  He denies any recent antibiotic use.      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   Sleep apnea     Patient Active Problem List   Diagnosis Date Noted   Chronic anticoagulation 01/27/2022   Morbid obesity (HCC) 12/06/2017   Hyperlipidemia 09/26/2017   Colon cancer screening 08/23/2017   Erectile dysfunction associated with type 2 diabetes mellitus (HCC) 09/22/2014   Type II diabetes mellitus (HCC) 05/13/2014   OSA (obstructive sleep apnea) 05/13/2014   Excessive daytime sleepiness 04/20/2014   PFO (patent foramen ovale) 03/19/2014   Borderline hypertension    Saddle 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     Home Medications    Prior to Admission medications   Medication Sig Start Date End Date Taking? Authorizing Provider  albuterol   (VENTOLIN  HFA) 108 (90 Base) MCG/ACT inhaler Inhale 2 puffs into the lungs every 6 (six) hours as needed for wheezing or shortness of breath. 10/31/22  Yes Jason Leita Repine, FNP  dapagliflozin  propanediol (FARXIGA ) 10 MG TABS tablet TAKE 1 TABLET BY MOUTH EVERY DAY 10/09/23  Yes Jason Leita Repine, FNP  lisinopril  (ZESTRIL ) 10 MG tablet TAKE 1 TABLET BY MOUTH EVERY DAY 10/16/23  Yes Jason Leita Repine, FNP  metFORMIN  (GLUCOPHAGE -XR) 500 MG 24 hr tablet TAKE 2 TABLETS BY MOUTH TWICE A DAY 10/16/23  Yes Jason Leita Repine, FNP  pioglitazone  (ACTOS ) 30 MG tablet TAKE 1 TABLET BY MOUTH EVERY DAY 08/24/23  Yes Jason Leita Repine, FNP  sulfamethoxazole-trimethoprim (BACTRIM DS) 800-160 MG tablet Take 1 tablet by mouth 2 (two) times daily for 7 days. 01/11/24 01/18/24 Yes Gerod Caligiuri K, PA-C  tirzepatide  (MOUNJARO ) 7.5 MG/0.5ML Pen INJECT 7.5 MG SUBCUTANEOUSLY WEEKLY 09/17/23  Yes Jason Leita Repine, FNP  XARELTO  10 MG TABS tablet TAKE 1 TABLET BY MOUTH EVERY DAY. *INS ONLY COVERS 30 DS* 05/25/23  Yes Jason Leita Repine, FNP  blood glucose meter kit and supplies KIT Dispense based on patient and insurance preference. Use up to four times daily as directed. (FOR ICD-9 250.00, 250.01). 03/21/14   Regalado, Belkys A, MD  glucose blood (ACCU-CHEK AVIVA PLUS) test strip USE AS DIRECTED 4 TIMES A DAY 01/28/15   Calone, Gregory D, FNP  rosuvastatin  (CRESTOR ) 10 MG tablet TAKE 1  TABLET BY MOUTH EVERY DAY 10/16/23   Jason Leita Repine, FNP    Family History Family History  Problem Relation Age of Onset   Arrhythmia Mother    Breast cancer Mother    Hyperlipidemia Mother    Diabetes Father    Hypertension Father    Cancer Maternal Uncle    Breast cancer Maternal Grandmother    Cancer Maternal Grandfather    Stroke Paternal Grandmother    Colon cancer Neg Hx    Colon polyps Neg Hx    Esophageal cancer Neg Hx    Stomach cancer Neg Hx    Rectal cancer Neg Hx     Social History Social  History   Tobacco Use   Smoking status: Former    Current packs/day: 0.00    Types: Cigarettes    Quit date: 02/13/2002    Years since quitting: 21.9   Smokeless tobacco: Never  Vaping Use   Vaping status: Every Day  Substance Use Topics   Alcohol use: Yes    Alcohol/week: 0.0 standard drinks of alcohol    Comment: occasionallly   Drug use: No     Allergies   Patient has no known allergies.   Review of Systems Review of Systems  Constitutional:  Negative for activity change, appetite change, fatigue and fever.  Gastrointestinal:  Negative for abdominal pain, diarrhea, nausea and vomiting.  Genitourinary:  Positive for frequency and hematuria. Negative for dysuria, flank pain, penile pain, penile swelling and urgency.     Physical Exam Triage Vital Signs ED Triage Vitals  Encounter Vitals Group     BP 01/11/24 1512 133/84     Girls Systolic BP Percentile --      Girls Diastolic BP Percentile --      Boys Systolic BP Percentile --      Boys Diastolic BP Percentile --      Pulse Rate 01/11/24 1512 91     Resp 01/11/24 1512 18     Temp 01/11/24 1512 98.9 F (37.2 C)     Temp Source 01/11/24 1512 Oral     SpO2 01/11/24 1512 98 %     Weight --      Height --      Head Circumference --      Peak Flow --      Pain Score 01/11/24 1509 0     Pain Loc --      Pain Education --      Exclude from Growth Chart --    No data found.  Updated Vital Signs BP 133/84 (BP Location: Right Arm)   Pulse 91   Temp 98.9 F (37.2 C) (Oral)   Resp 18   SpO2 98%   Visual Acuity Right Eye Distance:   Left Eye Distance:   Bilateral Distance:    Right Eye Near:   Left Eye Near:    Bilateral Near:     Physical Exam Vitals reviewed.  Constitutional:      General: He is awake.     Appearance: Normal appearance. He is well-developed. He is not ill-appearing.     Comments: Very pleasant male appears stated age in no acute distress sitting comfortable in exam room  HENT:      Head: Normocephalic and atraumatic.  Cardiovascular:     Rate and Rhythm: Normal rate and regular rhythm.     Heart sounds: Normal heart sounds, S1 normal and S2 normal. No murmur heard. Pulmonary:     Effort: Pulmonary  effort is normal.     Breath sounds: Normal breath sounds. No stridor. No wheezing, rhonchi or rales.     Comments: Clear to auscultation bilaterally Abdominal:     General: Bowel sounds are normal.     Palpations: Abdomen is soft.     Tenderness: There is no abdominal tenderness. There is no right CVA tenderness, left CVA tenderness, guarding or rebound.     Comments: Benign abdominal exam  Neurological:     Mental Status: He is alert.  Psychiatric:        Behavior: Behavior is cooperative.      UC Treatments / Results  Labs (all labs ordered are listed, but only abnormal results are displayed) Labs Reviewed  POCT URINE DIPSTICK - Abnormal; Notable for the following components:      Result Value   Color, UA red (*)    Clarity, UA turbid (*)    Glucose, UA >=1,000 (*)    Blood, UA large (*)    POC PROTEIN,UA =30 (*)    Nitrite, UA Positive (*)    Leukocytes, UA Large (3+) (*)    All other components within normal limits  GLUCOSE, POCT (MANUAL RESULT ENTRY) - Abnormal; Notable for the following components:   POCT Glucose ([facility]) 102 (*)    All other components within normal limits  URINE CULTURE  CBC WITH DIFFERENTIAL/PLATELET  COMPREHENSIVE METABOLIC PANEL WITH GFR  APTT  PROTIME-INR    EKG   Radiology DG Abdomen 1 View Result Date: 01/11/2024 CLINICAL DATA:  hematuria EXAM: ABDOMEN - 1 VIEW COMPARISON:  None Available. FINDINGS: Nonobstructive bowel gas pattern.No pneumoperitoneum. No organomegaly or radiopaque calculi. No acute fracture or destructive lesion. The lung bases are clear. IMPRESSION: 1. Nonobstructive bowel gas pattern. 2. No visualized nephrolithiasis. Electronically Signed   By: Rogelia Myers M.D.   On: 01/11/2024 16:32     Procedures Procedures (including critical care time)  Medications Ordered in UC Medications - No data to display  Initial Impression / Assessment and Plan / UC Course  I have reviewed the triage vital signs and the nursing notes.  Pertinent labs & imaging results that were available during my care of the patient were reviewed by me and considered in my medical decision making (see chart for details).     Patient is well-appearing, afebrile, nontoxic, nontachycardic.  UA showed gross hematuria that interfered with automated dipstick we do not have access to a microscope in clinic.  We will treat for UTI as I am concerned for hemorrhagic UTI worsened by chronic anticoagulation.  Will cover with Bactrim DS twice daily for 7 days; no indication for dose adjustment based on metabolic panel from 07/13/2023 with a creatinine of 1.01 calculated creatinine clearance of 171 mL/min.  We discussed that if he develops any rash or lesions she should stop the medication and be seen immediately.  Basic blood work including CBC, CMP, bleeding studies obtained we will contact him if this differs and changes our treatment plan.  Will send this for urine culture and contact her if we need to stop or change his antibiotics based on culture results.  Given episode of gross hematuria we discussed the most likely cause is his anticoagulation but that it is still important that he follows up with urologist for further evaluation and management.  We did obtain a KUB that was normal in clinic but discussed that we do not have access to CT and if this continues we would need more advanced imaging  and potentially cystoscopy.  He will contact urology to schedule an appointment first thing next week.  We discussed that if anything worsens or changes and he has any significant blood loss or signs of blood loss including palpitations, weakness, lightheadedness, shortness of breath or any signs of worsening infection such as fever,  nausea, vomiting you need to be seen emergently.  Strict return precautions given.  Excuse note provided.  Final Clinical Impressions(s) / UC Diagnoses   Final diagnoses:  Gross hematuria     Discharge Instructions      Your x-ray did not show any kidney stones.  I would like you to follow-up with a urologist; call to schedule appointment first thing next week.  We are treating you for an infection.  Start Bactrim DS twice daily for 7 days.  If you develop any rash or oral lesions stop the medication and be seen immediately.  Make sure that you are drinking plenty of fluid.  If anything changes and you have ongoing significant blood in your urine, feel like you have significant blood loss including lightheadedness/heart racing/shortness of breath/weakness, fever, nausea/vomiting to go to the emergency room immediately.     ED Prescriptions     Medication Sig Dispense Auth. Provider   sulfamethoxazole-trimethoprim (BACTRIM DS) 800-160 MG tablet Take 1 tablet by mouth 2 (two) times daily for 7 days. 14 tablet Clarrisa Kaylor K, PA-C      PDMP not reviewed this encounter.   Sherrell Rocky POUR, PA-C 01/11/24 1658

## 2024-01-11 NOTE — Discharge Instructions (Signed)
 Your x-ray did not show any kidney stones.  I would like you to follow-up with a urologist; call to schedule appointment first thing next week.  We are treating you for an infection.  Start Bactrim DS twice daily for 7 days.  If you develop any rash or oral lesions stop the medication and be seen immediately.  Make sure that you are drinking plenty of fluid.  If anything changes and you have ongoing significant blood in your urine, feel like you have significant blood loss including lightheadedness/heart racing/shortness of breath/weakness, fever, nausea/vomiting to go to the emergency room immediately.

## 2024-01-13 ENCOUNTER — Ambulatory Visit (HOSPITAL_COMMUNITY): Payer: Self-pay | Admitting: Physician Assistant

## 2024-01-13 LAB — URINE CULTURE: Culture: >=100000 — AB

## 2024-01-13 MED ORDER — CIPROFLOXACIN HCL 500 MG PO TABS: 500.0000 mg | ORAL_TABLET | Freq: Two times a day (BID) | ORAL | 0 refills | Status: AC

## 2024-01-14 ENCOUNTER — Telehealth (HOSPITAL_COMMUNITY): Payer: Self-pay | Admitting: *Deleted

## 2024-01-14 NOTE — Telephone Encounter (Signed)
 PT called this AM because he missed Erin R. Call yesterday. Pt reported he picked up his med at pharmacy.

## 2024-04-24 ENCOUNTER — Encounter: Admitting: Physician Assistant
